# Patient Record
Sex: Male | Born: 1950
Health system: Southern US, Community
[De-identification: ages and names within clinical notes are randomized; demographics above are authoritative.]

## PROBLEM LIST (undated history)

## (undated) DIAGNOSIS — D509 Iron deficiency anemia, unspecified: Secondary | ICD-10-CM

## (undated) DIAGNOSIS — N281 Cyst of kidney, acquired: Secondary | ICD-10-CM

## (undated) DIAGNOSIS — G4730 Sleep apnea, unspecified: Secondary | ICD-10-CM

## (undated) DIAGNOSIS — G20C Parkinsonism, unspecified: Secondary | ICD-10-CM

## (undated) DIAGNOSIS — G301 Alzheimer's disease with late onset: Secondary | ICD-10-CM

## (undated) DIAGNOSIS — F02B3 Dementia in other diseases classified elsewhere, moderate, with mood disturbance: Secondary | ICD-10-CM

## (undated) DIAGNOSIS — I10 Essential (primary) hypertension: Secondary | ICD-10-CM

## (undated) DIAGNOSIS — H547 Unspecified visual loss: Secondary | ICD-10-CM

## (undated) DIAGNOSIS — R972 Elevated prostate specific antigen [PSA]: Secondary | ICD-10-CM

## (undated) DIAGNOSIS — K219 Gastro-esophageal reflux disease without esophagitis: Secondary | ICD-10-CM

## (undated) DIAGNOSIS — F32A Depression, unspecified: Secondary | ICD-10-CM

## (undated) DIAGNOSIS — U071 COVID-19: Secondary | ICD-10-CM

## (undated) DIAGNOSIS — F329 Major depressive disorder, single episode, unspecified: Secondary | ICD-10-CM

## (undated) DIAGNOSIS — G4733 Obstructive sleep apnea (adult) (pediatric): Secondary | ICD-10-CM

## (undated) DIAGNOSIS — K2270 Barrett's esophagus without dysplasia: Secondary | ICD-10-CM

## (undated) DIAGNOSIS — D126 Benign neoplasm of colon, unspecified: Secondary | ICD-10-CM

## (undated) DIAGNOSIS — I219 Acute myocardial infarction, unspecified: Secondary | ICD-10-CM

## (undated) DIAGNOSIS — N401 Enlarged prostate with lower urinary tract symptoms: Secondary | ICD-10-CM

## (undated) DIAGNOSIS — I509 Heart failure, unspecified: Secondary | ICD-10-CM

## (undated) DIAGNOSIS — E785 Hyperlipidemia, unspecified: Secondary | ICD-10-CM

## (undated) HISTORY — DX: Unspecified visual loss: H54.7

## (undated) HISTORY — DX: Depression, unspecified: F32.A

## (undated) HISTORY — PX: VASECTOMY: SHX75

## (undated) HISTORY — DX: Gastro-esophageal reflux disease without esophagitis: K21.9

## (undated) HISTORY — DX: Heart failure, unspecified: I50.9

## (undated) HISTORY — DX: Acute myocardial infarction, unspecified: I21.9

## (undated) HISTORY — DX: Sleep apnea, unspecified: G47.30

## (undated) HISTORY — DX: Major depressive disorder, single episode, unspecified: F32.9

## (undated) HISTORY — DX: COVID-19: U07.1

## (undated) HISTORY — DX: Essential (primary) hypertension: I10

---

## 1978-03-31 HISTORY — PX: HEMORROIDECTOMY: SUR656

## 2005-03-21 ENCOUNTER — Emergency Department: Payer: Self-pay | Admitting: Emergency Medicine

## 2005-03-21 ENCOUNTER — Other Ambulatory Visit: Payer: Self-pay

## 2005-04-17 ENCOUNTER — Ambulatory Visit: Payer: Self-pay | Admitting: Gastroenterology

## 2005-04-22 ENCOUNTER — Ambulatory Visit: Payer: Self-pay | Admitting: Gastroenterology

## 2006-06-05 ENCOUNTER — Ambulatory Visit: Payer: Self-pay | Admitting: General Surgery

## 2006-06-08 ENCOUNTER — Ambulatory Visit: Payer: Self-pay | Admitting: General Surgery

## 2007-06-10 IMAGING — CR DG SMALL BOWEL
2 series · 8 of 8 positions shown · non-contrast
Comparison: none

REASON FOR EXAM: Anemia
COMMENTS:

[Series 1: view not recorded · 0.17mm/px · 6 of 6 slices shown]
[im 1/6]
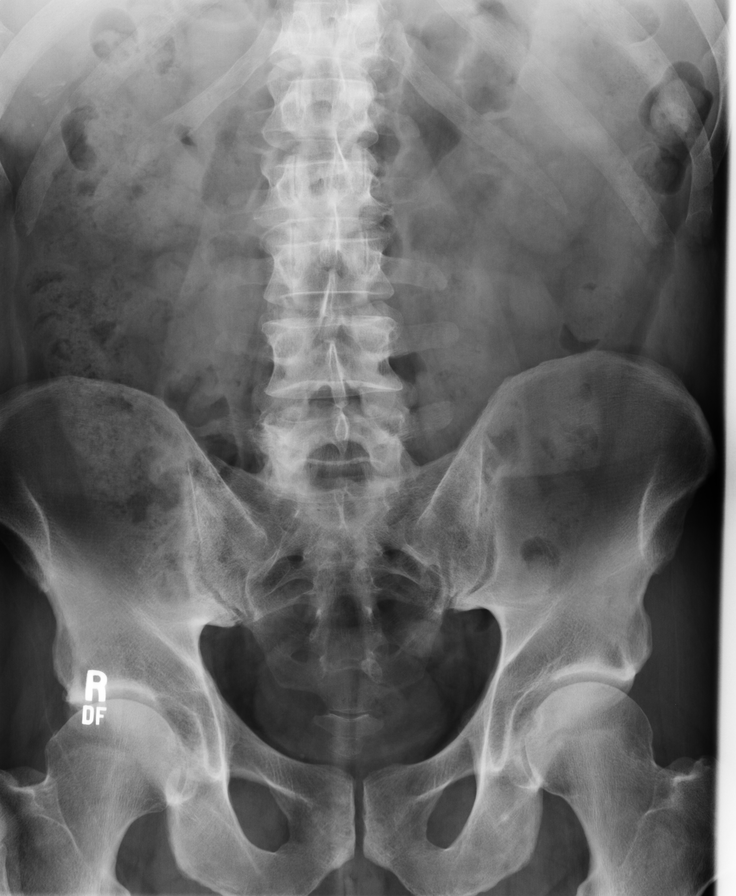
[im 2/6]
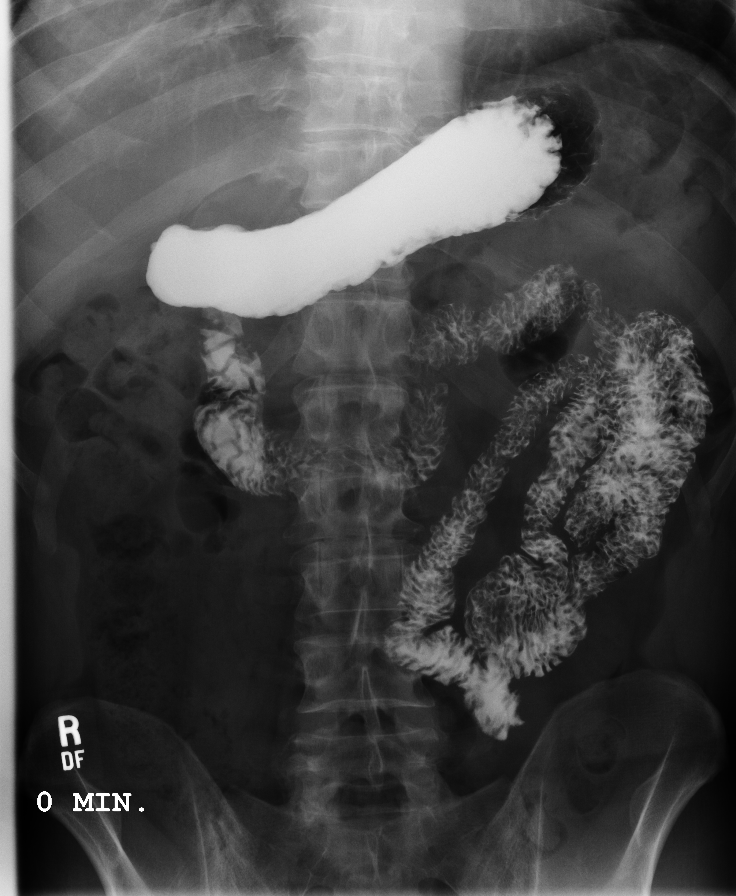
[im 3/6]
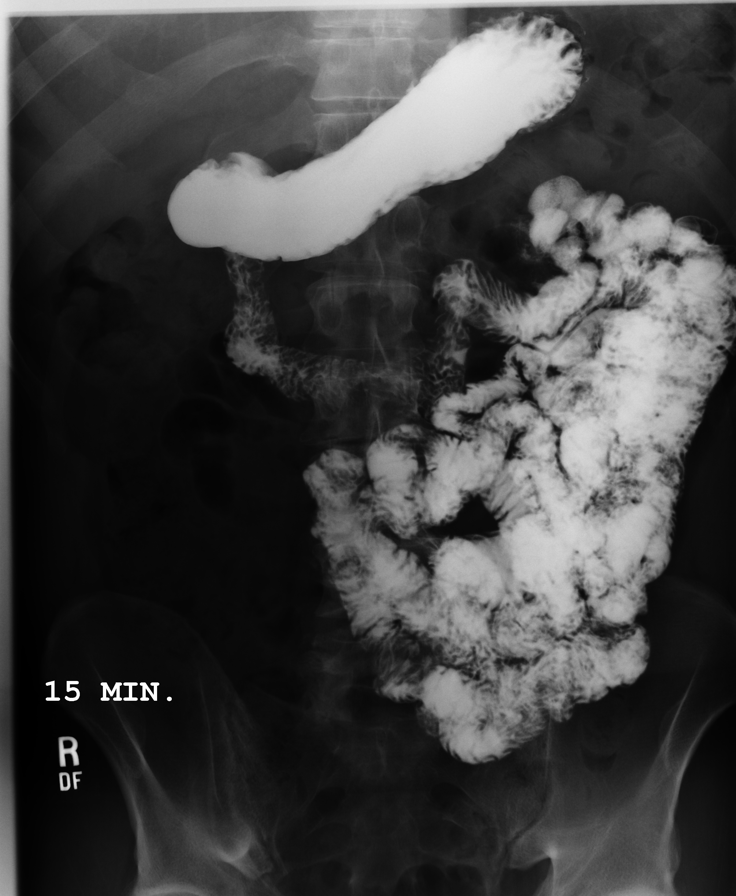
[im 4/6]
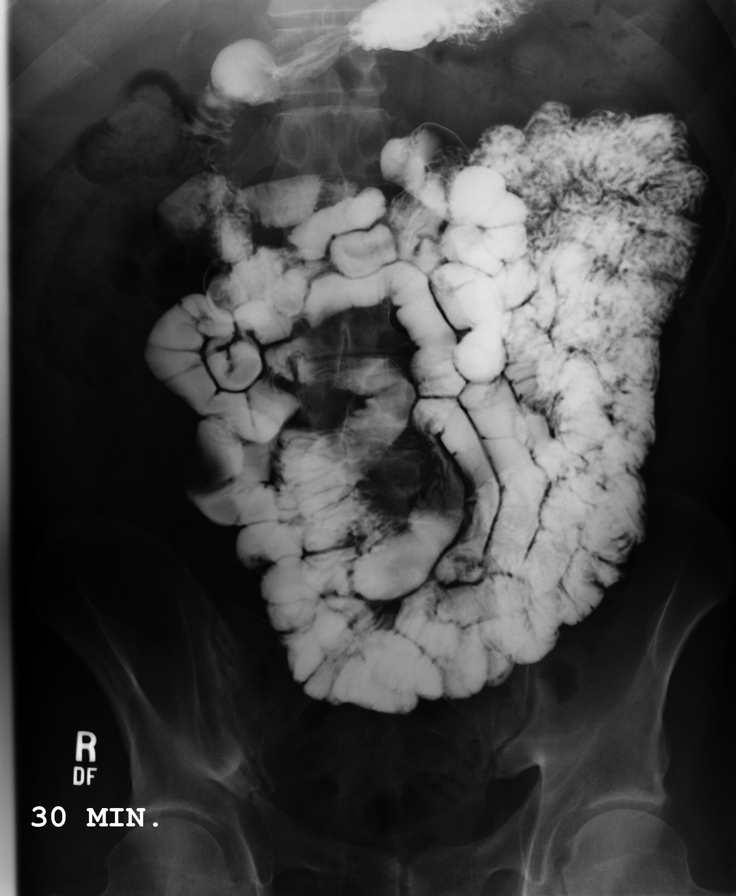
[im 5/6]
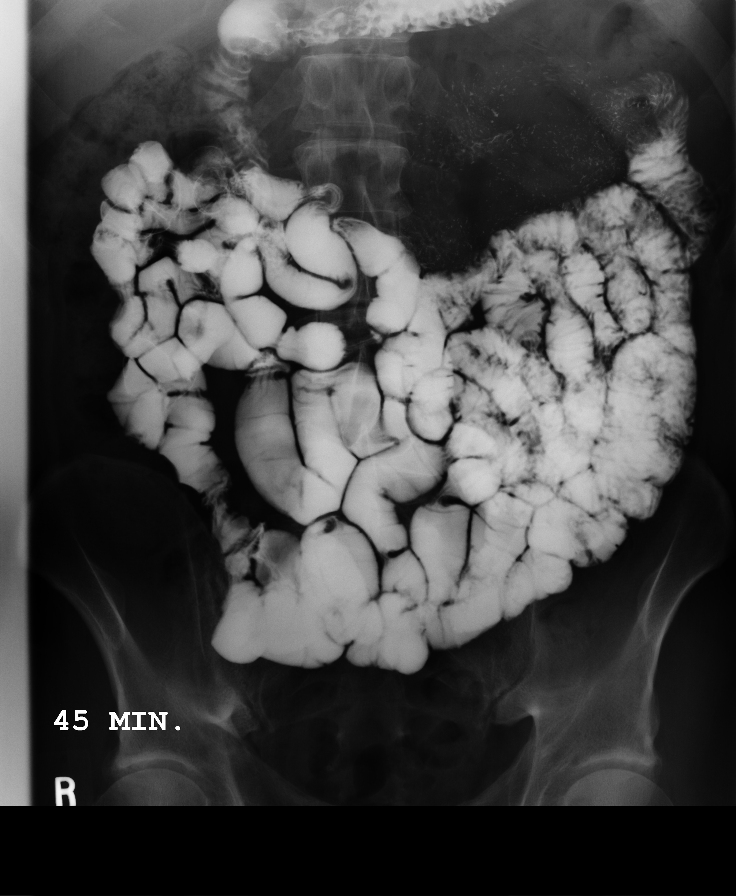
[im 6/6]
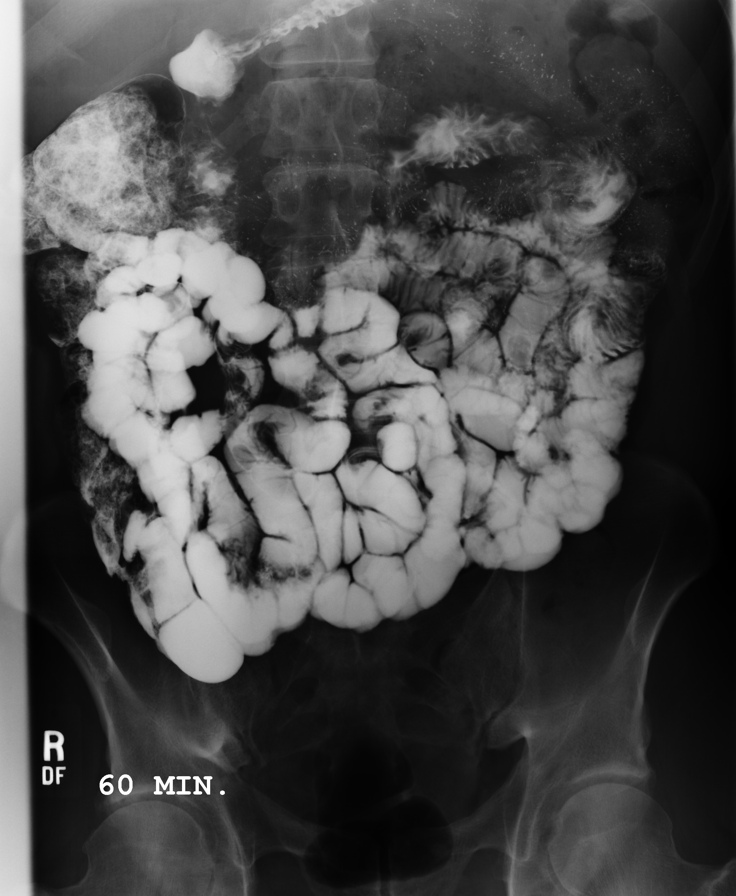

[Series 1: run · 2 of 2 slices shown]
[im 1/2]
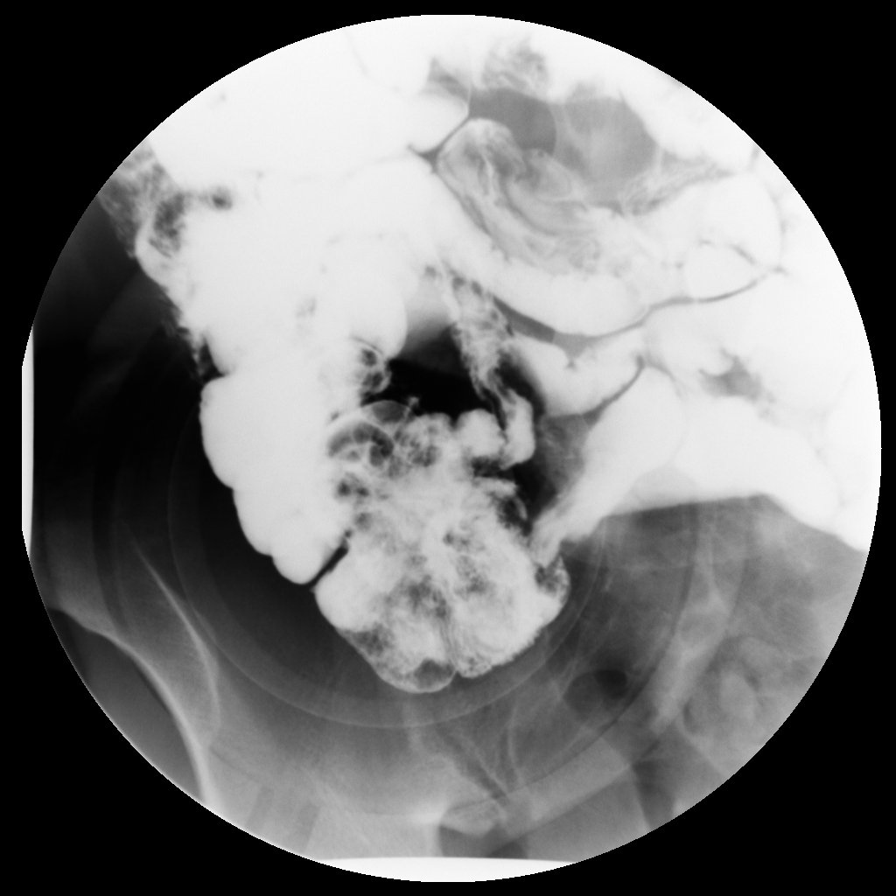
[im 2/2]
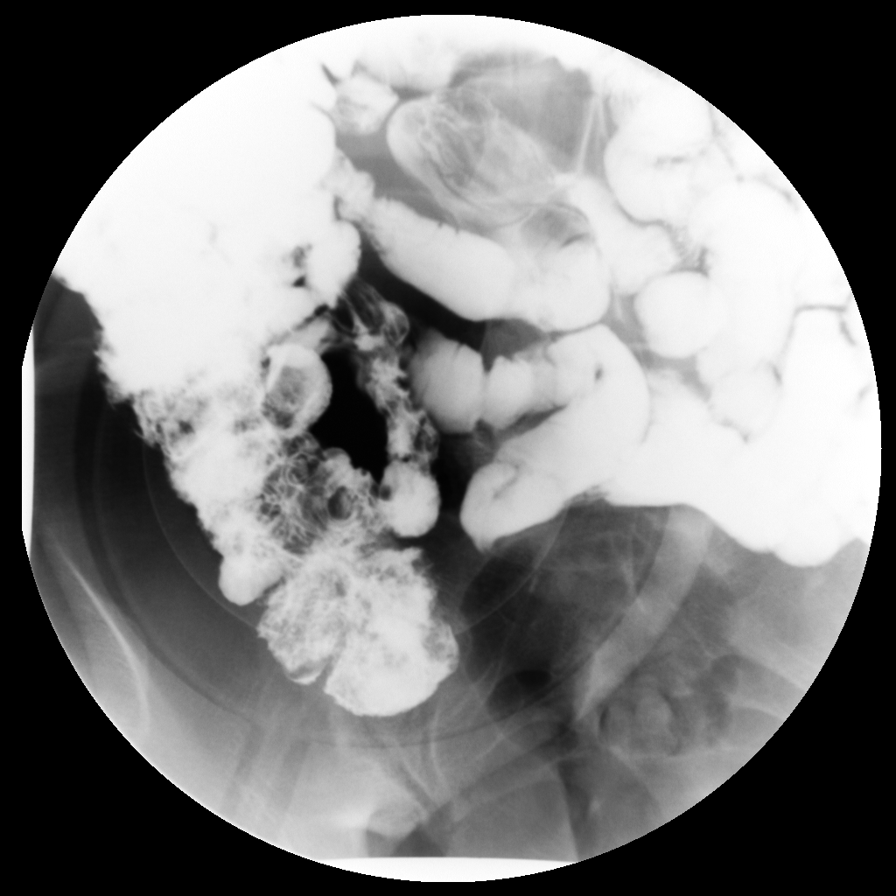

[8 of 8 positions shown; findings below may reference images not displayed]

PROCEDURE:     FL  - FL SMALL BOWEL  - April 22, 2005 [DATE]

RESULT:       The patient is being evaluated for anemia.

The anticipated procedure was discussed with Mr. Dawidh.  He voiced his
willingness to proceed. Scout film revealed considerable stool in the colon
but no evidence of obstruction.

Over the course of 60 minutes, the barium passed from duodenum to the RIGHT
colon.  The jejunal and ileal loops demonstrated normal mucosal pattern.
There was no tenderness to palpation and no evidence of stricture nor
abnormal dilation.  The RIGHT colon demonstrated the presence of
considerable stool.  The terminal ileum was demonstrated and peristalsis
through it was normal.
IMPRESSION: 1.     I do not see abnormality of the jejunum nor ileum.
2.     There is considerable stool present in the RIGHT colon which limits
evaluation of the cecum and specific region of the ileocecal valve.  The
terminal ileum itself distended well.

## 2009-11-29 ENCOUNTER — Ambulatory Visit: Payer: Self-pay | Admitting: General Practice

## 2010-01-14 ENCOUNTER — Ambulatory Visit: Payer: Self-pay | Admitting: Gastroenterology

## 2010-01-16 LAB — PATHOLOGY REPORT

## 2014-04-07 ENCOUNTER — Emergency Department: Payer: Self-pay | Admitting: Emergency Medicine

## 2015-01-05 ENCOUNTER — Ambulatory Visit: Payer: BLUE CROSS/BLUE SHIELD | Attending: Neurology

## 2015-01-05 DIAGNOSIS — G4761 Periodic limb movement disorder: Secondary | ICD-10-CM | POA: Insufficient documentation

## 2015-01-05 DIAGNOSIS — G4733 Obstructive sleep apnea (adult) (pediatric): Secondary | ICD-10-CM | POA: Diagnosis not present

## 2015-02-24 ENCOUNTER — Encounter: Payer: Self-pay | Admitting: Internal Medicine

## 2015-03-08 ENCOUNTER — Encounter: Payer: Self-pay | Admitting: Internal Medicine

## 2015-04-09 ENCOUNTER — Encounter: Payer: Self-pay | Admitting: Internal Medicine

## 2015-04-10 ENCOUNTER — Encounter: Payer: Self-pay | Admitting: Internal Medicine

## 2015-04-23 ENCOUNTER — Encounter: Payer: Self-pay | Admitting: Internal Medicine

## 2015-04-23 ENCOUNTER — Ambulatory Visit (INDEPENDENT_AMBULATORY_CARE_PROVIDER_SITE_OTHER): Payer: BLUE CROSS/BLUE SHIELD | Admitting: Internal Medicine

## 2015-04-23 VITALS — BP 122/68 | HR 59 | Ht 72.0 in | Wt 185.5 lb

## 2015-04-23 DIAGNOSIS — G4733 Obstructive sleep apnea (adult) (pediatric): Secondary | ICD-10-CM

## 2015-04-23 NOTE — Patient Instructions (Addendum)
--  Will start an Auto-PAP low=8; high=16 --If having trouble tolerating the pressure, please call and we can decrease the pressure.

## 2015-04-23 NOTE — Progress Notes (Signed)
[facility] Consultation      Assessment and Plan:  Obstructive sleep apnea.  - elevated AHI on CPAP titration at higher pressures. Suspect this may be secondary to inadequately treated obstructive sleep apnea or more likely a suspect that this is treatment  related apnea episodes , which are  arising and REM sleep and at higher pressures. This appears to sink with what we are seeing on his original sleep study which showed continued apneas during REM phase.   - he appears to have improved in terms of excessive daytime sleepiness symptoms even at the  lower pressure. Therefore, we will restart an auto titrating CPAP with a low pressure of 8 and a high of 16. Will adjust based on findings. He is asked to call us should he find it difficult to tolerate the higher pressures a week and back. This down a bit.  Essential Hypertension.   - sleep apnea can contribute to hypertension. Therefore, we'll need to monitor this. Currently his hypertension appears to be adequately controlled without medications.  Bursitis  -patient notes that his knee pain appears to interrupt his sleep , this appears to be adequately controlled with meloxicam , would continue.  Date: 04/23/2015  MRN# [id number] [name] [name] [birth year]  Referring Physician: Dr. Verl Bangs [name] [name] is a 65 y.o. old male seen in consultation for chief complaint of:    Chief Complaint  Patient presents with  . SLEEP CONSULT    pt. ref. by dr. Corinda [name]. pt. states he started CPAP 29mo. ago set pressure of 8 didn't feel that was strong enough. wears everynoght 4-6 hr. everynight.  supplies needed (pillow, hose & flitters).     HPI:    the patient is a 65 year old male with a history of obstructive sleep apnea. He is currently on CPAP, but is having trouble getting his sleep apnea adequately controlled. The patient typically goes to bed between 10 to 10:30 PM he falls asleep within about one hour. He wakes up or  possibly 3 times a night, and wakes up in the morning at 6 AM.  He goes to Sunday school, and he was noted to be dozing off, one of his friends has OSA and recommended that he look into it. He then spoke with his doctor and a sleep study was done.   He initially had a sleep study in October of 2016, pressure was set at 8. When he was at 62, he felt that he was doing well, he was feeling more rested and was tolerated the pressure. On his downloads he continued to have AHI that was elevated, thus he was put on a auto PAP and still have elevated AHI.  He notes that at the high pressures, he could no longer tolerate the PAP. Then it was turned back to 8 and he tolerates it much better, particularly now that he is on a nasal pillows. He notices now that he is back to 8, he is less sleepy during the day. He notes that the night of the sleep study it was the "best sleep of my life".    Review of previous sleep study raw data and tracings : 01/05/2015 ; apnea popping index was 44 , the PLM index was 62 , with an arousal index of 8 , the night was split the patient was started on CPAP and titrated up to a level of 8 within a child of 65 he did achieve REM sleep in supine position for  17 minutes , the arousal index and REM sleep was 7. Overall this test shows severe obstructive sleep apnea which could be treated at a CPAP of 8.   - subsequent auto titrating CPAP at a low pressure of 4. High pressure of 20 , review of the tracing shows that the patient 95th percentile was at 19 centimeters water , subsequently his pressure was  Increased to 19 , thendecreased back to 8 , presumably due to intolerance of the high pressure.  PMHX:   Past Medical History  Diagnosis Date  . Hypertension   . Sleep apnea    Surgical Hx:  History reviewed. No pertinent past surgical history. Family Hx:  Family History  Problem Relation Age of Onset  . Stroke Father   . Heart attack Father    Social Hx:   Social History    Substance Use Topics  . Smoking status: Not on file  . Smokeless tobacco: Not on file  . Alcohol Use: Not on file   Medication:   Current Outpatient Rx  Name  Route  Sig  Dispense  Refill  . meloxicam (MOBIC) 15 MG tablet   Oral   Take 15 mg by mouth daily.         Marland Kitchen omeprazole (PRILOSEC) 20 MG capsule            10       Allergies:  Review of patient's allergies indicates not on file.  Review of Systems: Gen:  Denies  fever, sweats, chills HEENT: Denies blurred vision, double vision. bleeds, sore throat Cvc:  No dizziness, chest pain. Resp:   Denies cough or sputum porduction, shortness of breath Gi: Denies swallowing difficulty, stomach pain. Gu:  Denies bladder incontinence, burning urine Ext:   No Joint pain, stiffness. Skin: No skin rash,  hives Endoc:  No polyuria, polydipsia. Psych: No depression, insomnia. Other:  All other systems were reviewed with the patient and were negative other that what is mentioned in the HPI.   Physical Examination:   VS: BP 122/68 mmHg  Pulse 59  Ht 6' (1.829 m)  Wt 185 lb 8 oz (84.142 kg)  BMI 25.15 kg/m2  SpO2 97%  General Appearance: No distress  Neuro:without focal findings,  speech normal,  HEENT: PERRLA, EOM intact.   Pulmonary: normal breath sounds, No wheezing.  CardiovascularNormal S1,S2.  No m/r/g.   Abdomen: Benign, Soft, non-tender. Renal:  No costovertebral tenderness  GU:  No performed at this time. Endoc: No evident thyromegaly, no signs of acromegaly. Skin:   warm, no rashes, no ecchymosis  Extremities: normal, no cyanosis, clubbing.  Other findings:    LABORATORY PANEL:   CBC No results for input(s): WBC, HGB, HCT, PLT in the last 168 hours. ------------------------------------------------------------------------------------------------------------------  Chemistries  No results for input(s): NA, K, CL, CO2, GLUCOSE, BUN, CREATININE, CALCIUM, MG, AST, ALT, ALKPHOS, BILITOT in the last 168  hours.  Invalid input(s): GFRCGP ------------------------------------------------------------------------------------------------------------------  Cardiac Enzymes No results for input(s): TROPONINI in the last 168 hours. ------------------------------------------------------------  RADIOLOGY:  No results found.     Thank  you for the consultation and for allowing [facility] to assist in the care of your patient. Our recommendations are noted above.  Please contact us if we can be of further service.   Marda Stalker, MD.  Board Certified in Internal Medicine, Pulmonary Medicine, [place], and Sleep Medicine.  [facility]   Patricia Pesa, M.D.  Vilinda Boehringer, M.D.  Merton Border, M.D

## 2015-05-29 ENCOUNTER — Encounter: Payer: Self-pay | Admitting: Internal Medicine

## 2015-07-20 ENCOUNTER — Ambulatory Visit (INDEPENDENT_AMBULATORY_CARE_PROVIDER_SITE_OTHER): Payer: BLUE CROSS/BLUE SHIELD | Admitting: Internal Medicine

## 2015-07-20 ENCOUNTER — Encounter: Payer: Self-pay | Admitting: Internal Medicine

## 2015-07-20 VITALS — BP 124/72 | HR 65 | Ht 72.0 in | Wt 181.0 lb

## 2015-07-20 DIAGNOSIS — G4733 Obstructive sleep apnea (adult) (pediatric): Secondary | ICD-10-CM | POA: Diagnosis not present

## 2015-07-20 NOTE — Progress Notes (Signed)
* [facility]     Assessment and Plan:  Obstructive sleep apnea.  - elevated AHI on CPAP titration at higher pressures.   - he appears to have improved in terms of excessive daytime sleepiness symptoms even at the  lower pressure. Today, we will adjust his auto pressure from 8-16 up to 12-16. We will likely leave it at that level for these foreseeable future as long as he tolerates this and his apnea levels are tolerable.  Essential Hypertension.    - sleep apnea can contribute to hypertension. Therefore, we'll need to monitor this. Currently his hypertension appears to be adequately controlled without medications.  Bursitis  -patient notes that his knee pain appears to interrupt his sleep , this appears to be adequately controlled with meloxicam , would continue.   Date: 07/20/2015  MRN# [id number] [name] [name] 10-31-1950   [name] [name] is a 65 y.o. old male seen in follow up for chief complaint of  Chief Complaint  Patient presents with  . Follow-up    wears CPAP 5hr everynight. feels pressure is good. no supplies needed. [id number]     HPI:   Patient was put on an auto titration study done from 04/30/2015. 2. Febrile 28th 2017 AutoSet pressure was between 8 and 16. The residual apnea-hypopnea index was 8.6.  He has been using the machine at the lower pressure, he notes that he is tolerating the pressure, despite the elevated AHI he is not sleeping as much as he used to, he previously slept 7 hours per night, but now he sleeps about 5 hours per night and he feels well with this. He is waking more refreshed and is not as sleepy during the day.    Review of previous sleep study raw data and tracings : 01/05/2015 ; apnea hypopnea index was 44 , the PLM index was 62 , with an arousal index of 8 , the night was split the patient was started on CPAP and titrated up to a level of 8 within a child of 1.6 he did achieve REM sleep in supine position for 17 minutes ,  the arousal index and REM sleep was 7. Overall this test shows severe obstructive sleep apnea which could be treated at a CPAP of 8.    - subsequent auto titrating CPAP at a low pressure of 4. High pressure of 20 , review of the tracing shows that the patient 95th percentile was at 19 centimeters water , subsequently his pressure was  Increased to 19 , thendecreased back to 8 , presumably due to intolerance of the high pressure.  Medication:   Outpatient Encounter Prescriptions as of 07/20/2015  Medication Sig  . aspirin-acetaminophen-caffeine (EXCEDRIN MIGRAINE) [id number] MG tablet Take by mouth every 6 (six) hours as needed for headache.  . b complex vitamins capsule Take 1 capsule by mouth daily.  . meloxicam (MOBIC) 15 MG tablet Take 15 mg by mouth daily.  Marland Kitchen omeprazole (PRILOSEC) 20 MG capsule   . UNABLE TO FIND Take 65 mg by mouth. IRON  . vitamin C (ASCORBIC ACID) 500 MG tablet Take 500 mg by mouth daily.   No facility-administered encounter medications on file as of 07/20/2015.     Allergies:  Review of patient's allergies indicates no known allergies.  Review of Systems: Gen:  Denies  fever, sweats. HEENT: Denies blurred vision. Cvc:  No dizziness, chest pain or heaviness Resp:   Denies cough or sputum porduction. Gi: Denies swallowing difficulty, stomach pain.  constipation, bowel incontinence Gu:  Denies bladder incontinence, burning urine Ext:   No Joint pain, stiffness. Skin: No skin rash, easy bruising. Endoc:  No polyuria, polydipsia. Psych: No depression, insomnia. Other:  All other systems were reviewed and found to be negative other than what is mentioned in the HPI.   Physical Examination:   VS: BP 124/72 mmHg  Pulse 65  Ht 6' (1.829 m)  Wt 181 lb (82.101 kg)  BMI 24.54 kg/m2  SpO2 98%  General Appearance: No distress  Neuro:without focal findings,  speech normal,  HEENT: PERRLA, EOM intact. Pulmonary: normal breath sounds, No wheezing.     CardiovascularNormal S1,S2.  No m/r/g.   Abdomen: Benign, Soft, non-tender. Renal:  No costovertebral tenderness  GU:  Not performed at this time. Endoc: No evident thyromegaly, no signs of acromegaly. Skin:   warm, no rash. Extremities: normal, no cyanosis, clubbing.   LABORATORY PANEL:   CBC No results for input(s): WBC, HGB, HCT, PLT in the last 168 hours. ------------------------------------------------------------------------------------------------------------------  Chemistries  No results for input(s): NA, K, CL, CO2, GLUCOSE, BUN, CREATININE, CALCIUM, MG, AST, ALT, ALKPHOS, BILITOT in the last 168 hours.  Invalid input(s): GFRCGP ------------------------------------------------------------------------------------------------------------------  Cardiac Enzymes No results for input(s): TROPONINI in the last 168 hours. ------------------------------------------------------------  RADIOLOGY:   No results found for this or any previous visit. No results found for this or any previous visit. ------------------------------------------------------------------------------------------------------------------  Thank  you for allowing [facility] to assist in the care of your patient. Our recommendations are noted above.  Please contact us if we can be of further service.   Marda Stalker, MD.  [facility] Office Number: [phone]  Patricia Pesa, M.D.  Vilinda Boehringer, M.D.  Merton Border, M.D  07/20/2015

## 2015-07-20 NOTE — Patient Instructions (Addendum)
We will change the auto-titration level to 12 to 16, and leave it at that level.    Sleep Apnea Sleep apnea is disorder that affects a person's sleep. A person with sleep apnea has abnormal pauses in their breathing when they sleep. It is hard for them to get a good sleep. This makes a person tired during the day. It also can lead to other physical problems. There are three types of sleep apnea. One type is when breathing stops for a short time because your airway is blocked (obstructive sleep apnea). Another type is when the brain sometimes fails to give the normal signal to breathe to the muscles that control your breathing (central sleep apnea). The third type is a combination of the other two types. HOME CARE   Take all medicine as told by your doctor.  Avoid alcohol, calming medicines (sedatives), and depressant drugs.  Try to lose weight if you are overweight. Talk to your doctor about a healthy weight goal.  Your doctor may have you use a device that helps to open your airway. It can help you get the air that you need. It is called a positive airway pressure (PAP) device.   MAKE SURE YOU:   Understand these instructions.  Will watch your condition.  Will get help right away if you are not doing well or get worse.  It may take approximately 1 month for you to get used to wearing her CPAP every night.

## 2015-07-20 NOTE — Addendum Note (Signed)
Addended by: Oscar La R on: 07/20/2015 09:08 AM   Modules accepted: Orders

## 2015-07-27 ENCOUNTER — Encounter: Admission: RE | Payer: Self-pay | Source: Ambulatory Visit

## 2015-07-27 ENCOUNTER — Ambulatory Visit
Admission: RE | Admit: 2015-07-27 | Payer: BLUE CROSS/BLUE SHIELD | Source: Ambulatory Visit | Admitting: Gastroenterology

## 2015-07-27 SURGERY — ESOPHAGOGASTRODUODENOSCOPY (EGD) WITH PROPOFOL
Anesthesia: General
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
## 2017-05-20 ENCOUNTER — Ambulatory Visit (INDEPENDENT_AMBULATORY_CARE_PROVIDER_SITE_OTHER): Payer: BLUE CROSS/BLUE SHIELD | Admitting: Cardiovascular Disease

## 2017-05-20 ENCOUNTER — Encounter: Payer: Self-pay | Admitting: Cardiovascular Disease

## 2017-05-20 VITALS — BP 140/87 | HR 58 | Ht 72.0 in | Wt 179.0 lb

## 2017-05-20 DIAGNOSIS — G4733 Obstructive sleep apnea (adult) (pediatric): Secondary | ICD-10-CM | POA: Diagnosis not present

## 2017-05-20 DIAGNOSIS — R079 Chest pain, unspecified: Secondary | ICD-10-CM | POA: Diagnosis not present

## 2017-05-20 DIAGNOSIS — I10 Essential (primary) hypertension: Secondary | ICD-10-CM | POA: Insufficient documentation

## 2017-05-20 DIAGNOSIS — R55 Syncope and collapse: Secondary | ICD-10-CM

## 2017-05-20 DIAGNOSIS — R9431 Abnormal electrocardiogram [ECG] [EKG]: Secondary | ICD-10-CM | POA: Diagnosis not present

## 2017-05-20 HISTORY — DX: Syncope and collapse: R55

## 2017-05-20 NOTE — Patient Instructions (Addendum)
Please call if you have chest pain with exertion   Medication Instructions:   No medication changes made  Labwork:  No new labs needed  Testing/Procedures:  No further testing at this time  Research CT coronary calcium score $150   Follow-Up: It was a pleasure seeing you in the office today. Please call us if you have new issues that need to be addressed before your next appt.  [phone]  Your physician wants you to follow-up in:  As needed  If you need a refill on your cardiac medications before your next appointment, please call your pharmacy.  For educational health videos Log in to : [email or web address] Or : [email or web address], password : [email or web address]
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
## 2017-06-16 LAB — PSA: PSA: 4.2

## 2017-06-23 ENCOUNTER — Other Ambulatory Visit: Payer: Self-pay

## 2017-06-24 ENCOUNTER — Ambulatory Visit: Payer: BLUE CROSS/BLUE SHIELD | Admitting: Cardiovascular Disease

## 2017-07-21 ENCOUNTER — Ambulatory Visit: Payer: Self-pay | Admitting: Urology

## 2017-09-29 ENCOUNTER — Ambulatory Visit: Payer: PPO | Admitting: Urology

## 2017-09-29 ENCOUNTER — Encounter: Payer: Self-pay | Admitting: Urology

## 2017-09-29 VITALS — BP 170/92 | HR 62 | Ht 72.0 in | Wt 179.0 lb

## 2017-09-29 DIAGNOSIS — R972 Elevated prostate specific antigen [PSA]: Secondary | ICD-10-CM | POA: Diagnosis not present

## 2017-09-29 DIAGNOSIS — N138 Other obstructive and reflux uropathy: Secondary | ICD-10-CM | POA: Diagnosis not present

## 2017-09-29 DIAGNOSIS — N401 Enlarged prostate with lower urinary tract symptoms: Secondary | ICD-10-CM | POA: Diagnosis not present

## 2017-09-29 LAB — URINALYSIS, COMPLETE
Bilirubin, UA: NEGATIVE
Glucose, UA: NEGATIVE
Ketones, UA: NEGATIVE
LEUKOCYTES UA: NEGATIVE
NITRITE UA: NEGATIVE
PH UA: 7 (ref 5.0–7.5)
Protein, UA: NEGATIVE
RBC UA: NEGATIVE
SPEC GRAV UA: 1.01 (ref 1.005–1.030)
Urobilinogen, Ur: 0.2 mg/dL (ref 0.2–1.0)

## 2017-09-29 NOTE — Progress Notes (Signed)
09/29/2017 5:20 PM   [name] [birth year] [id number]  Referring provider: Karen Kitchens, MD [address] [place], [place] [address]  Chief Complaint  Patient presents with  . Elevated PSA    New Patient    HPI: 67 yo M referred for further evaluation of elevated and rising PSA.  Most recently, his PSA has risen to 4.2    PSA trend as below.  He is followed by the city of [place].  He does have baseline urinary symptoms including urinary frequency, urgency which were fairly new onset around the time his PSA was drawn early this spring.  He was started on Flomax and his urinary symptoms resolved.  He is currently pleased with his voiding symptoms.    He was on finaseride 2017 x 6 months but ended up stopping this medication as it did not help his urinary symptoms.  He is very   vague recollection of this medication.  He believes he father may have had prostate cancer in his 33 s but died of other causes.    No UTIs or gross hematuria.   PSA 2.5. (2.22/2018)--> 3.9 (05/12/2017) --> 4.2 (06/16/17).    PMH: Past Medical History:  Diagnosis Date  . Depression   . GERD (gastroesophageal reflux disease)   . Heart attack ([place])   . Heart failure ([place])   . Hypertension   . Sleep apnea     Surgical History: Past Surgical History:  Procedure Laterality Date  . HEMORROIDECTOMY  1980    Home Medications:  Allergies as of 09/29/2017   No Known Allergies     Medication List        Accurate as of 09/29/17 11:59 PM. Always use your most recent med list.          CIALIS 10 MG tablet Generic drug:  tadalafil   omeprazole 20 MG capsule Commonly known as:  PRILOSEC Take 20 mg by mouth daily.   tamsulosin 0.4 MG Caps capsule Commonly known as:  FLOMAX       Allergies: No Known Allergies  Family History: Family History  Problem Relation Age of Onset  . Stroke Father   . Heart attack Father   . Prostate cancer Father     Social History:  reports that he  has never smoked. He has never used smokeless tobacco. He reports that he drinks alcohol. He reports that he does not use drugs.  ROS: UROLOGY Frequent Urination?: No Hard to postpone urination?: No Burning/pain with urination?: No Get up at night to urinate?: No Leakage of urine?: No Urine stream starts and stops?: No Trouble starting stream?: No Do you have to strain to urinate?: No Blood in urine?: No Urinary tract infection?: No Sexually transmitted disease?: No Injury to kidneys or bladder?: No Painful intercourse?: No Weak stream?: No Erection problems?: No Penile pain?: No  Gastrointestinal Nausea?: No Vomiting?: No Indigestion/heartburn?: No Diarrhea?: No Constipation?: No  Constitutional Fever: No Night sweats?: No Weight loss?: No Fatigue?: No  Skin Skin rash/lesions?: No Itching?: No  Eyes Blurred vision?: No Double vision?: No  Ears/Nose/Throat Sore throat?: No Sinus problems?: No  Hematologic/Lymphatic Swollen glands?: No Easy bruising?: No  Cardiovascular Leg swelling?: No Chest pain?: No  Respiratory Cough?: No Shortness of breath?: No  Endocrine Excessive thirst?: No  Musculoskeletal Back pain?: No Joint pain?: No  Neurological Headaches?: No Dizziness?: No  Psychologic Depression?: No Anxiety?: No  Physical Exam: BP (!) 170/92   Pulse 62   Ht  6' (1.829 m)   Wt 179 lb (81.2 kg)   BMI 24.28 kg/m   Constitutional:  Alert and oriented, No acute distress. HEENT: [place] AT, moist mucus membranes.  Trachea midline, no masses. Cardiovascular: No clubbing, cyanosis, or edema. Respiratory: Normal respiratory effort, no increased work of breathing. GI: Abdomen is soft, nontender, nondistended, no abdominal masses GU: No CVA tenderness Rectal: Normal sphincter tone.  Normal prostate, mildly enlarged without nodules or tenderness. Skin: No rashes, bruises or suspicious lesions. Neurologic: Grossly intact, no focal deficits,  moving all 4 extremities. Psychiatric: Normal mood and affect.  Laboratory Data: PSA as above  Urinalysis Results for orders placed or performed in visit on 09/29/17  Urinalysis, Complete  Result Value Ref Range   Specific Gravity, UA 1.010 1.005 - 1.030   pH, UA 7.0 5.0 - 7.5   Color, UA Yellow Yellow   Appearance Ur Clear Clear   Leukocytes, UA Negative Negative   Protein, UA Negative Negative/Trace   Glucose, UA Negative Negative   Ketones, UA Negative Negative   RBC, UA Negative Negative   Bilirubin, UA Negative Negative   Urobilinogen, Ur 0.2 0.2 - 1.0 mg/dL   Nitrite, UA Negative Negative  PSA  Result Value Ref Range   Prostate Specific Ag, Serum 3.8 0.0 - 4.0 ng/mL     Pertinent Imaging: n/a  Assessment & Plan:    1. Elevated PSA  We reviewed the implications of an elevated PSA and the uncertainty surrounding it. In general, a man's PSA increases with age and is produced by both normal and cancerous prostate tissue. Differential for elevated PSA is BPH, prostate cancer, infection, recent intercourse/ejaculation, prostate infarction, recent urethroscopic manipulation (foley placement/cystoscopy) and prostatitis. Management of an elevated PSA can include observation or prostate biopsy and wediscussed this in detail. We discussed that indications for prostate biopsy are defined by age and race specific PSA cutoffs as well as a PSA velocity of 0.75/year.  PSA was repeated today and is pending.  We will call him with the result.  Follow-up plan to be based on repeat PSA.  We discussed options including continued very close surveillance of his PSA versus proceeding with prostate biopsy.  We did go ahead and discussed the risk of prostate biopsy at length today and prepare him for this in case his PSA came back more elevated than the aforementioned values.  He is agreeable to proceed if needed. - Urinalysis, Complete - PSA  2. BPH with obstruction/lower urinary tract  symptoms Voiding symptoms improved on Flomax Given the fairly quick onset of the symptoms, question whether or not this was related to possible prostatitis   Follow-up TBD based on PSA  Hollice Espy, MD  [facility] [address] [place], [place] [address] [phone]

## 2017-09-30 LAB — PSA: PROSTATE SPECIFIC AG, SERUM: 3.8 ng/mL (ref 0.0–4.0)

## 2017-10-02 ENCOUNTER — Telehealth: Payer: Self-pay

## 2017-10-02 NOTE — Telephone Encounter (Signed)
-----   Message from Hollice Espy, MD sent at 09/30/2017  4:04 PM EDT ----- PSA is back down to 3.8.  Lets see you again in 9 months with PSA to reassess.  This is good news.    Hollice Espy, MD

## 2017-10-02 NOTE — Telephone Encounter (Signed)
Pt informed, please make sure an appointment has been made for 9 months with psa prior

## 2017-12-21 ENCOUNTER — Encounter: Payer: Self-pay | Admitting: Family Medicine

## 2017-12-21 ENCOUNTER — Ambulatory Visit (INDEPENDENT_AMBULATORY_CARE_PROVIDER_SITE_OTHER): Payer: PPO | Admitting: Family Medicine

## 2017-12-21 VITALS — BP 150/92 | HR 59 | Temp 98.3°F | Ht 69.0 in | Wt 180.4 lb

## 2017-12-21 DIAGNOSIS — E785 Hyperlipidemia, unspecified: Secondary | ICD-10-CM | POA: Insufficient documentation

## 2017-12-21 DIAGNOSIS — R972 Elevated prostate specific antigen [PSA]: Secondary | ICD-10-CM | POA: Insufficient documentation

## 2017-12-21 DIAGNOSIS — K22719 Barrett's esophagus with dysplasia, unspecified: Secondary | ICD-10-CM | POA: Insufficient documentation

## 2017-12-21 DIAGNOSIS — K2270 Barrett's esophagus without dysplasia: Secondary | ICD-10-CM | POA: Insufficient documentation

## 2017-12-21 DIAGNOSIS — G4733 Obstructive sleep apnea (adult) (pediatric): Secondary | ICD-10-CM

## 2017-12-21 DIAGNOSIS — Z23 Encounter for immunization: Secondary | ICD-10-CM | POA: Diagnosis not present

## 2017-12-21 DIAGNOSIS — Z5181 Encounter for therapeutic drug level monitoring: Secondary | ICD-10-CM | POA: Diagnosis not present

## 2017-12-21 DIAGNOSIS — I10 Essential (primary) hypertension: Secondary | ICD-10-CM | POA: Diagnosis not present

## 2017-12-21 DIAGNOSIS — E7800 Pure hypercholesterolemia, unspecified: Secondary | ICD-10-CM | POA: Diagnosis not present

## 2017-12-21 MED ORDER — AMLODIPINE BESYLATE 5 MG PO TABS: 5.0000 mg | ORAL_TABLET | Freq: Every day | ORAL | 3 refills | Status: DC

## 2017-12-21 NOTE — Assessment & Plan Note (Signed)
Managed by pulmonologist 

## 2017-12-21 NOTE — Assessment & Plan Note (Signed)
Managed by urologist 

## 2017-12-21 NOTE — Assessment & Plan Note (Addendum)
Patient believes this just happens at the doctor; there is white coat hypertension, but it's important to monitor and treat if not controlled; try DASH guidelines; avoid NSAIDs, avoid decongestants; he did agree to start CCB today; explained possible side effects (constipation, ankle edema), call if bothersome; close f/u in 2 weeks

## 2017-12-21 NOTE — Progress Notes (Signed)
BP (!) 150/92   Pulse (!) 59   Temp 98.3 F (36.8 C) (Oral)   Ht 5\' 9"  (1.753 m)   Wt 180 lb 6.4 oz (81.8 kg)   SpO2 96%   BMI 26.64 kg/m    Subjective:    Patient ID: [name] [name], male    DOB: 03/02/1951, 67 y.o.   MRN: [id number]  HPI: [name] [name] is a 67 y.o. male  Chief Complaint  Patient presents with  . New Patient (Initial Visit)    HPI He is here to establish care; he brought in his medicine list from previous doctor  Recent high PSA; his father may have had prostate cancer but passed from something else; urine stream is good, but getting up at night frequently; changed time of the flomax and that helped; time release; was getting up 3 am, so taking in the afternoon and now improved frequency at night  High blood pressure; he has white coat hypertension ; every time he comes to the doctor, BP goes up at the doctor but normal with the nurse; last time it was high, the doctor said to rest his arm and that improved it; father smoked like crazy, he took HTN medicines; he does not use NSAIDs or decongestants regularly; just if sick; no chest pain or headaches  Months ago, he went to the bathroom on the plane from [place] to [place], passed out; tried to get back up and passed out again; did EKG at [facility]; medics checked him out; he went to see his doctor for the city of [place], then saw cardiologist; they diagnosed him with dehydration; he was throwing up, sick prior to getting on the plane; no syncopal episodes since then; EKG at cardiologist office, Dr. Clovis Riley; they cleared him completely, no f/u planned  Barrett's esophagus; managed by GI [facility] at [facility]; had EGD in 2011 and note from 2017 mentioned getting EGD, but patient has not had that done yet; not taking PPI regularly, last was within the last week; trying to avoid triggers; no blood int eh stool or weight loss  OSA: managed by pulmonologist; he went through all the testing; did not tolerate CPAP; they  have tried other options; talked to cardiologist about it too  High cholesterol; he has not taken any cholesterol medicine for a few years; changed his diet and then it improved  Reviewed previous labs; elevated iron, 190 something; he says he was told about it but doesn't know why it was high  Depression screen [facility] 2/9 12/21/2017 12/21/2017  Decreased Interest 0 0  Down, Depressed, Hopeless 0 0  PHQ - 2 Score 0 0  Altered sleeping 3 -  Tired, decreased energy 0 -  Change in appetite 0 -  Feeling bad or failure about yourself  0 -  Trouble concentrating 0 -  Moving slowly or fidgety/restless 0 -  Suicidal thoughts 0 -  PHQ-9 Score 3 -  Difficult doing work/chores Not difficult at all -   Fall Risk  12/21/2017  Falls in the past year? No    Relevant past medical, surgical, family and social history reviewed Past Medical History:  Diagnosis Date  . Depression   . GERD (gastroesophageal reflux disease)   . Heart attack ([place])   . Heart failure ([place])   . Hypertension   . Sleep apnea    Past Surgical History:  Procedure Laterality Date  . HEMORROIDECTOMY  1980   Family History  Problem Relation Age of Onset  .  Stroke Father   . Heart attack Father   . Prostate cancer Father   . Heart disease Father   . Dementia Mother   . Heart attack Paternal Grandmother    Social History   Tobacco Use  . Smoking status: Never Smoker  . Smokeless tobacco: Never Used  Substance Use Topics  . Alcohol use: Yes    Alcohol/week: 0.0 standard drinks  . Drug use: No    Interim medical history since last visit reviewed. Allergies and medications reviewed  Review of Systems Per HPI unless specifically indicated above     Objective:    BP (!) 150/92   Pulse (!) 59   Temp 98.3 F (36.8 C) (Oral)   Ht 5\' 9"  (1.753 m)   Wt 180 lb 6.4 oz (81.8 kg)   SpO2 96%   BMI 26.64 kg/m   Wt Readings from Last 3 Encounters:  12/21/17 180 lb 6.4 oz (81.8 kg)  09/29/17 179 lb (81.2 kg)    05/20/17 179 lb (81.2 kg)    Physical Exam  Constitutional: He appears well-developed and well-nourished. No distress.  HENT:  Head: Normocephalic and atraumatic.  Eyes: EOM are normal. No scleral icterus.  Neck: No thyromegaly present.  Cardiovascular: Regular rhythm. Bradycardia present.  Borderline bradycardic rate  Pulmonary/Chest: Effort normal and breath sounds normal.  Abdominal: Soft. Bowel sounds are normal. He exhibits no distension. There is no tenderness.  Musculoskeletal: He exhibits no edema.  Neurological: Coordination normal.  Skin: Skin is warm and dry. No pallor.  Psychiatric: He has a normal mood and affect. His mood appears not anxious. He does not exhibit a depressed mood.    Results for orders placed or performed in visit on 09/29/17  Urinalysis, Complete  Result Value Ref Range   Specific Gravity, UA 1.010 1.005 - 1.030   pH, UA 7.0 5.0 - 7.5   Color, UA Yellow Yellow   Appearance Ur Clear Clear   Leukocytes, UA Negative Negative   Protein, UA Negative Negative/Trace   Glucose, UA Negative Negative   Ketones, UA Negative Negative   RBC, UA Negative Negative   Bilirubin, UA Negative Negative   Urobilinogen, Ur 0.2 0.2 - 1.0 mg/dL   Nitrite, UA Negative Negative  PSA  Result Value Ref Range   Prostate Specific Ag, Serum 3.8 0.0 - 4.0 ng/mL      Assessment & Plan:   Problem List Items Addressed This Visit      Cardiovascular and Mediastinum   Essential hypertension - Primary (Chronic)    Patient believes this just happens at the doctor; there is white coat hypertension, but it's important to monitor and treat if not controlled; try DASH guidelines; avoid NSAIDs, avoid decongestants; he did agree to start CCB today; explained possible side effects (constipation, ankle edema), call if bothersome; close f/u in 2 weeks      Relevant Medications   amLODipine (NORVASC) 5 MG tablet     Respiratory   Obstructive sleep apnea (Chronic)    Managed by  pulmonologist      Relevant Orders   CBC with Differential/Platelet     Digestive   Barrett's esophagus with dysplasia (Chronic)   Relevant Orders   Ambulatory referral to Gastroenterology     Other   Medication monitoring encounter    Check labs      Relevant Orders   COMPLETE METABOLIC PANEL WITH GFR   High cholesterol   Relevant Medications   amLODipine (NORVASC) 5 MG tablet  Other Relevant Orders   Lipid panel   Elevated PSA    Managed by urologist       Other Visit Diagnoses    Need for influenza vaccination       Relevant Orders   Flu Vaccine QUAD 6+ mos PF IM (Fluarix Quad PF) (Completed)   Need for vaccination against Streptococcus pneumoniae using pneumococcal conjugate vaccine 13       Relevant Orders   Pneumococcal conjugate vaccine 13-valent IM (Completed)   Iron overload       Relevant Orders   Iron, TIBC and Ferritin Panel   CBC with Differential/Platelet       Follow up plan: Return in about 2 weeks (around 01/04/2018) for follow-up visit with Dr. Sanda Klein.  An after-visit summary was printed and given to the patient at [facility].  Please see the patient instructions which may contain other information and recommendations beyond what is mentioned above in the assessment and plan.  Meds ordered this encounter  Medications  . amLODipine (NORVASC) 5 MG tablet    Sig: Take 1 tablet (5 mg total) by mouth daily.    Dispense:  90 tablet    Refill:  3    Orders Placed This Encounter  Procedures  . Flu Vaccine QUAD 6+ mos PF IM (Fluarix Quad PF)  . Pneumococcal conjugate vaccine 13-valent IM  . COMPLETE METABOLIC PANEL WITH GFR  . Iron, TIBC and Ferritin Panel  . CBC with Differential/Platelet  . Lipid panel  . Ambulatory referral to Gastroenterology

## 2017-12-21 NOTE — Assessment & Plan Note (Signed)
Check labs 

## 2017-12-21 NOTE — Patient Instructions (Addendum)
Try to follow the DASH guidelines (DASH stands for Dietary Approaches to Stop Hypertension). Try to limit the sodium in your diet to no more than 1,500mg  of sodium per day. Certainly try to not exceed 2,000 mg per day at the very most. Do not add salt when cooking or at the table.  Check the sodium amount on labels when shopping, and choose items lower in sodium when given a choice. Avoid or limit foods that already contain a lot of sodium. Eat a diet rich in fruits and vegetables and whole grains, and try to lose weight if overweight or obese  Try to use PLAIN allergy medicine without the decongestant Avoid: phenylephrine, phenylpropanolamine, and pseudoephredine  If you need something for aches or pains, try to use Tylenol (acetaminophen) instead of non-steroidals (which include Aleve, ibuprofen, Advil, Motrin, and naproxen); non-steroidals can cause long-term kidney damage  Please do pick up a blood pressure cuff and monitor your blood pressure at home If the numbers are running over 140/90, please contact me Untreated hypertension increases the risk of heart attack and stroke, as well as other problems   DASH Eating Plan DASH stands for "Dietary Approaches to Stop Hypertension." The DASH eating plan is a healthy eating plan that has been shown to reduce high blood pressure (hypertension). It may also reduce your risk for type 2 diabetes, heart disease, and stroke. The DASH eating plan may also help with weight loss. What are tips for following this plan? General guidelines  Avoid eating more than 2,300 mg (milligrams) of salt (sodium) a day. If you have hypertension, you may need to reduce your sodium intake to 1,500 mg a day.  Limit alcohol intake to no more than 1 drink a day for nonpregnant women and 2 drinks a day for men. One drink equals 12 oz of beer, 5 oz of wine, or 1 oz of hard liquor.  Work with your health care provider to maintain a healthy body weight or to lose weight. Ask  what an ideal weight is for you.  Get at least 30 minutes of exercise that causes your heart to beat faster (aerobic exercise) most days of the week. Activities may include walking, swimming, or biking.  Work with your health care provider or diet and nutrition specialist (dietitian) to adjust your eating plan to your individual calorie needs. Reading food labels  Check food labels for the amount of sodium per serving. Choose foods with less than 5 percent of the Daily Value of sodium. Generally, foods with less than 300 mg of sodium per serving fit into this eating plan.  To find whole grains, look for the word "whole" as the first word in the ingredient list. Shopping  Buy products labeled as "low-sodium" or "no salt added."  Buy fresh foods. Avoid canned foods and premade or frozen meals. Cooking  Avoid adding salt when cooking. Use salt-free seasonings or herbs instead of table salt or sea salt. Check with your health care provider or pharmacist before using salt substitutes.  Do not fry foods. Cook foods using healthy methods such as baking, boiling, grilling, and broiling instead.  Cook with heart-healthy oils, such as olive, canola, soybean, or sunflower oil. Meal planning   Eat a balanced diet that includes: ? 5 or more servings of fruits and vegetables each day. At each meal, try to fill half of your plate with fruits and vegetables. ? Up to 6-8 servings of whole grains each day. ? Less than 6 oz of  lean meat, poultry, or fish each day. A 3-oz serving of meat is about the same size as a deck of cards. One egg equals 1 oz. ? 2 servings of low-fat dairy each day. ? A serving of nuts, seeds, or beans 5 times each week. ? Heart-healthy fats. Healthy fats called Omega-3 fatty acids are found in foods such as flaxseeds and coldwater fish, like sardines, salmon, and mackerel.  Limit how much you eat of the following: ? Canned or prepackaged foods. ? Food that is high in trans  fat, such as fried foods. ? Food that is high in saturated fat, such as fatty meat. ? Sweets, desserts, sugary drinks, and other foods with added sugar. ? Full-fat dairy products.  Do not salt foods before eating.  Try to eat at least 2 vegetarian meals each week.  Eat more home-cooked food and less restaurant, buffet, and fast food.  When eating at a restaurant, ask that your food be prepared with less salt or no salt, if possible. What foods are recommended? The items listed may not be a complete list. Talk with your dietitian about what dietary choices are best for you. Grains Whole-grain or whole-wheat bread. Whole-grain or whole-wheat pasta. Brown rice. [name]. Bulgur. Whole-grain and low-sodium cereals. Pita bread. Low-fat, low-sodium crackers. Whole-wheat flour tortillas. Vegetables Fresh or frozen vegetables (raw, steamed, roasted, or grilled). Low-sodium or reduced-sodium tomato and vegetable juice. Low-sodium or reduced-sodium tomato sauce and tomato paste. Low-sodium or reduced-sodium canned vegetables. Fruits All fresh, dried, or frozen fruit. Canned fruit in natural juice (without added sugar). Meat and other protein foods Skinless chicken or [place]. Ground chicken or [place]. Pork with fat trimmed off. Fish and seafood. Egg whites. Dried beans, peas, or lentils. Unsalted nuts, nut butters, and seeds. Unsalted canned beans. Lean cuts of beef with fat trimmed off. Low-sodium, lean deli meat. Dairy Low-fat (1%) or fat-free (skim) milk. Fat-free, low-fat, or reduced-fat cheeses. Nonfat, low-sodium ricotta or cottage cheese. Low-fat or nonfat yogurt. Low-fat, low-sodium cheese. Fats and oils Soft margarine without trans fats. Vegetable oil. Low-fat, reduced-fat, or light mayonnaise and salad dressings (reduced-sodium). Canola, safflower, olive, soybean, and sunflower oils. Avocado. Seasoning and other foods Herbs. Spices. Seasoning mixes without salt. Unsalted popcorn and  pretzels. Fat-free sweets. What foods are not recommended? The items listed may not be a complete list. Talk with your dietitian about what dietary choices are best for you. Grains Baked goods made with fat, such as croissants, muffins, or some breads. Dry pasta or rice meal packs. Vegetables Creamed or fried vegetables. Vegetables in a cheese sauce. Regular canned vegetables (not low-sodium or reduced-sodium). Regular canned tomato sauce and paste (not low-sodium or reduced-sodium). Regular tomato and vegetable juice (not low-sodium or reduced-sodium). Angie Fava. Olives. Fruits Canned fruit in a light or heavy syrup. Fried fruit. Fruit in cream or butter sauce. Meat and other protein foods Fatty cuts of meat. Ribs. Fried meat. Berniece Salines. Sausage. Bologna and other processed lunch meats. Salami. Fatback. Hotdogs. Bratwurst. Salted nuts and seeds. Canned beans with added salt. Canned or smoked fish. Whole eggs or egg yolks. Chicken or [place] with skin. Dairy Whole or 2% milk, cream, and half-and-half. Whole or full-fat cream cheese. Whole-fat or sweetened yogurt. Full-fat cheese. Nondairy creamers. Whipped toppings. Processed cheese and cheese spreads. Fats and oils Butter. Stick margarine. Lard. Shortening. Ghee. Bacon fat. Tropical oils, such as coconut, palm kernel, or palm oil. Seasoning and other foods Salted popcorn and pretzels. Onion salt, garlic salt, seasoned salt, table  salt, and sea salt. Worcestershire sauce. Tartar sauce. Barbecue sauce. Teriyaki sauce. Soy sauce, including reduced-sodium. Steak sauce. Canned and packaged gravies. Fish sauce. Oyster sauce. Cocktail sauce. Horseradish that you find on the shelf. Ketchup. Mustard. Meat flavorings and tenderizers. Bouillon cubes. Hot sauce and Tabasco sauce. Premade or packaged marinades. Premade or packaged taco seasonings. Relishes. Regular salad dressings. Where to find more information:  National Heart, Lung, and [place]:  https://wilson-eaton.com/  American Heart Association: www.heart.org Summary  The DASH eating plan is a healthy eating plan that has been shown to reduce high blood pressure (hypertension). It may also reduce your risk for type 2 diabetes, heart disease, and stroke.  With the DASH eating plan, you should limit salt (sodium) intake to 2,300 mg a day. If you have hypertension, you may need to reduce your sodium intake to 1,500 mg a day.  When on the DASH eating plan, aim to eat more fresh fruits and vegetables, whole grains, lean proteins, low-fat dairy, and heart-healthy fats.  Work with your health care provider or diet and nutrition specialist (dietitian) to adjust your eating plan to your individual calorie needs. This information is not intended to replace advice given to you by your health care provider. Make sure you discuss any questions you have with your health care provider. Document Released: 03/06/2011 Document Revised: 03/10/2016 Document Reviewed: 03/10/2016 Elsevier Interactive Patient Education  [employer].

## 2017-12-22 ENCOUNTER — Other Ambulatory Visit: Payer: Self-pay | Admitting: Family Medicine

## 2017-12-22 LAB — CBC WITH DIFFERENTIAL/PLATELET
Basophils Absolute: 32 cells/uL (ref 0–200)
Basophils Relative: 0.6 %
EOS ABS: 38 {cells}/uL (ref 15–500)
Eosinophils Relative: 0.7 %
HEMATOCRIT: 46 % (ref 38.5–50.0)
Hemoglobin: 16.4 g/dL (ref 13.2–17.1)
LYMPHS ABS: 1242 {cells}/uL (ref 850–3900)
MCH: 32.5 pg (ref 27.0–33.0)
MCHC: 35.7 g/dL (ref 32.0–36.0)
MCV: 91.3 fL (ref 80.0–100.0)
MPV: 10 fL (ref 7.5–12.5)
Monocytes Relative: 7 %
Neutro Abs: 3710 cells/uL (ref 1500–7800)
Neutrophils Relative %: 68.7 %
Platelets: 241 10*3/uL (ref 140–400)
RBC: 5.04 10*6/uL (ref 4.20–5.80)
RDW: 12.2 % (ref 11.0–15.0)
Total Lymphocyte: 23 %
WBC: 5.4 10*3/uL (ref 3.8–10.8)
WBCMIX: 378 {cells}/uL (ref 200–950)

## 2017-12-22 LAB — COMPLETE METABOLIC PANEL WITH GFR
AG RATIO: 1.8 (calc) (ref 1.0–2.5)
ALT: 13 U/L (ref 9–46)
AST: 16 U/L (ref 10–35)
Albumin: 4.6 g/dL (ref 3.6–5.1)
Alkaline phosphatase (APISO): 77 U/L (ref 40–115)
BUN: 16 mg/dL (ref 7–25)
CALCIUM: 9.8 mg/dL (ref 8.6–10.3)
CO2: 27 mmol/L (ref 20–32)
Chloride: 103 mmol/L (ref 98–110)
Creat: 1 mg/dL (ref 0.70–1.25)
GFR, EST AFRICAN AMERICAN: 90 mL/min/{1.73_m2} (ref >=60)
GFR, EST NON AFRICAN AMERICAN: 78 mL/min/{1.73_m2} (ref >=60)
GLOBULIN: 2.6 g/dL (ref 1.9–3.7)
Glucose, Bld: 86 mg/dL (ref 65–99)
POTASSIUM: 5 mmol/L (ref 3.5–5.3)
SODIUM: 138 mmol/L (ref 135–146)
TOTAL PROTEIN: 7.2 g/dL (ref 6.1–8.1)
Total Bilirubin: 0.9 mg/dL (ref 0.2–1.2)

## 2017-12-22 LAB — IRON,TIBC AND FERRITIN PANEL
%SAT: 55 % (calc) — ABNORMAL HIGH (ref 20–48)
Ferritin: 67 ng/mL (ref 24–380)
Iron: 183 ug/dL — ABNORMAL HIGH (ref 50–180)
TIBC: 332 mcg/dL (calc) (ref 250–425)

## 2017-12-22 LAB — LIPID PANEL
CHOL/HDL RATIO: 5 (calc) — AB (ref <5.0)
Cholesterol: 204 mg/dL — ABNORMAL HIGH (ref <200)
HDL: 41 mg/dL (ref >40)
LDL CHOLESTEROL (CALC): 132 mg/dL — AB
NON-HDL CHOLESTEROL (CALC): 163 mg/dL — AB (ref <130)
TRIGLYCERIDES: 170 mg/dL — AB (ref <150)

## 2017-12-22 MED ORDER — ATORVASTATIN CALCIUM 20 MG PO TABS: 20.0000 mg | ORAL_TABLET | Freq: Every day | ORAL | 1 refills | Status: DC

## 2017-12-22 NOTE — Progress Notes (Signed)
Start statin.  

## 2018-01-05 ENCOUNTER — Encounter: Payer: Self-pay | Admitting: Family Medicine

## 2018-01-05 ENCOUNTER — Ambulatory Visit (INDEPENDENT_AMBULATORY_CARE_PROVIDER_SITE_OTHER): Payer: PPO | Admitting: Family Medicine

## 2018-01-05 VITALS — BP 130/76 | HR 73 | Temp 97.9°F | Ht 69.0 in | Wt 176.9 lb

## 2018-01-05 DIAGNOSIS — E7800 Pure hypercholesterolemia, unspecified: Secondary | ICD-10-CM

## 2018-01-05 DIAGNOSIS — I10 Essential (primary) hypertension: Secondary | ICD-10-CM

## 2018-01-05 DIAGNOSIS — K22719 Barrett's esophagus with dysplasia, unspecified: Secondary | ICD-10-CM

## 2018-01-05 NOTE — Patient Instructions (Addendum)
In the future, stop the bergamot if you need to take a certain antibiotic or get out in the sun (increases sun sensitivity) Try to limit saturated fats in your diet (bologna, hot dogs, barbeque, cheeseburgers, hamburgers, steak, bacon, sausage, cheese, etc.) and get more fresh fruits, vegetables, and whole grains Return for just fasting labs 6 weeks after you started the atorvastatin   Cholesterol Cholesterol is a white, waxy, fat-like substance that is needed by the human body in small amounts. The liver makes all the cholesterol we need. Cholesterol is carried from the liver by the blood through the blood vessels. Deposits of cholesterol (plaques) may build up on blood vessel (artery) walls. Plaques make the arteries narrower and stiffer. Cholesterol plaques increase the risk for heart attack and stroke. You cannot feel your cholesterol level even if it is very high. The only way to know that it is high is to have a blood test. Once you know your cholesterol levels, you should keep a record of the test results. Work with your health care provider to keep your levels in the desired range. What do the results mean?  Total cholesterol is a rough measure of all the cholesterol in your blood.  LDL (low-density lipoprotein) is the "bad" cholesterol. This is the type that causes plaque to build up on the artery walls. You want this level to be low.  HDL (high-density lipoprotein) is the "good" cholesterol because it cleans the arteries and carries the LDL away. You want this level to be high.  Triglycerides are fat that the body can either burn for energy or store. High levels are closely linked to heart disease. What are the desired levels of cholesterol?  Total cholesterol below 200.  LDL below 100 for people who are at risk, below 70 for people at very high risk.  HDL above 40 is good. A level of 60 or higher is considered to be protective against heart disease.  Triglycerides below 150. How  can I lower my cholesterol? Diet Follow your diet program as told by your health care provider.  Choose fish or white meat chicken and [place], roasted or baked. Limit fatty cuts of red meat, fried foods, and processed meats, such as sausage and lunch meats.  Eat lots of fresh fruits and vegetables.  Choose whole grains, beans, pasta, potatoes, and cereals.  Choose olive oil, corn oil, or canola oil, and use only small amounts.  Avoid butter, mayonnaise, shortening, or palm kernel oils.  Avoid foods with trans fats.  Drink skim or nonfat milk and eat low-fat or nonfat yogurt and cheeses. Avoid whole milk, cream, ice cream, egg yolks, and full-fat cheeses.  Healthier desserts include angel food cake, ginger snaps, animal crackers, hard candy, popsicles, and low-fat or nonfat frozen yogurt. Avoid pastries, cakes, pies, and cookies.  Exercise  Follow your exercise program as told by your health care provider. A regular program: ? Helps to decrease LDL and raise HDL. ? Helps with weight control.  Do things that increase your activity level, such as gardening, walking, and taking the stairs.  Ask your health care provider about ways that you can be more active in your daily life.  Medicine  Take over-the-counter and prescription medicines only as told by your health care provider. ? Medicine may be prescribed by your health care provider to help lower cholesterol and decrease the risk for heart disease. This is usually done if diet and exercise have failed to bring down cholesterol levels. ?  If you have several risk factors, you may need medicine even if your levels are normal.  This information is not intended to replace advice given to you by your health care provider. Make sure you discuss any questions you have with your health care provider. Document Released: 12/10/2000 Document Revised: 10/13/2015 Document Reviewed: 09/15/2015 Elsevier Interactive Patient Education  [employer].

## 2018-01-05 NOTE — Assessment & Plan Note (Addendum)
Glad he is active and watching his diet; continue medicine; improved since last visit

## 2018-01-05 NOTE — Progress Notes (Signed)
BP 130/76   Pulse 73   Temp 97.9 F (36.6 C)   Ht 5\' 9"  (1.753 m)   Wt 176 lb 14.4 oz (80.2 kg)   SpO2 98%   BMI 26.12 kg/m    Subjective:    Patient ID: [name] [name], male    DOB: March 06, 1951, 67 y.o.   MRN: [id number]  HPI: [name] [name] is a 67 y.o. male  Chief Complaint  Patient presents with  . Follow-up  . Medication Refill    HPI Patient is here for follow-up His blood pressure was elevated; he has been on the new BP medicine for 3-4 days now He has been changing up his diet; no swelling in the legs and no constipation After he got the flu and pneumonia shot, he has felt dizzy and has had a mild headache; does not feel congested, but nose does run like a puppy dog  High cholesterol; he has changed his diet since learning about his levels; he is taking bergamot now; they would go eat barbeque or seafood on date nights; will be more conscious of salt content in his foods; taking atorvastatin He has lost a little weight; used to exercising and would ride his bike for an hour hard; going to the gym too Wife asked about natural supplements; asked about apple cider vinegar Barrett's esophagus; referred to Dr. Gustavo Lah last visit; has upcoming appointment on Oct 22nd He saw Dr. Rockey Situ a few months ago, Feb 2019 Dr. Erlene Quan is managing his prostate  Depression screen [facility] 2/9 01/05/2018 12/21/2017 12/21/2017  Decreased Interest 0 0 0  Down, Depressed, Hopeless 0 0 0  PHQ - 2 Score 0 0 0  Altered sleeping 3 3 -  Tired, decreased energy 3 0 -  Change in appetite 0 0 -  Feeling bad or failure about yourself  0 0 -  Trouble concentrating 0 0 -  Moving slowly or fidgety/restless 0 0 -  Suicidal thoughts 0 0 -  PHQ-9 Score 6 3 -  Difficult doing work/chores Not difficult at all Not difficult at all -   Fall Risk  01/05/2018 12/21/2017  Falls in the past year? No No    Relevant past medical, surgical, family and social history reviewed Past Medical History:  Diagnosis Date    . Depression   . GERD (gastroesophageal reflux disease)   . Heart attack ([place])   . Heart failure ([place])   . Hypertension   . Sleep apnea    Past Surgical History:  Procedure Laterality Date  . HEMORROIDECTOMY  1980   Family History  Problem Relation Age of Onset  . Stroke Father   . Heart attack Father   . Prostate cancer Father   . Heart disease Father   . Dementia Mother   . Heart attack Paternal Grandmother    Social History   Tobacco Use  . Smoking status: Never Smoker  . Smokeless tobacco: Never Used  Substance Use Topics  . Alcohol use: Yes    Alcohol/week: 0.0 standard drinks  . Drug use: No     Office Visit from 01/05/2018 in [facility]  AUDIT-C Score  0      Interim medical history since last visit reviewed. Allergies and medications reviewed  Review of Systems Per HPI unless specifically indicated above     Objective:    BP 130/76   Pulse 73   Temp 97.9 F (36.6 C)   Ht 5\' 9"  (1.753 m)  Wt 176 lb 14.4 oz (80.2 kg)   SpO2 98%   BMI 26.12 kg/m   Wt Readings from Last 3 Encounters:  01/05/18 176 lb 14.4 oz (80.2 kg)  12/21/17 180 lb 6.4 oz (81.8 kg)  09/29/17 179 lb (81.2 kg)    Physical Exam  Constitutional: He appears well-developed and well-nourished. No distress.  Eyes: No scleral icterus.  Cardiovascular: Normal rate and regular rhythm.  Pulmonary/Chest: Effort normal and breath sounds normal.  Neurological: He is alert.  Skin: No pallor.  Psychiatric: He has a normal mood and affect.    Results for orders placed or performed in visit on 12/21/17  COMPLETE METABOLIC PANEL WITH GFR  Result Value Ref Range   Glucose, Bld 86 65 - 99 mg/dL   BUN 16 7 - 25 mg/dL   Creat 1.00 0.70 - 1.25 mg/dL   GFR, Est Non African American 78 > OR = 60 mL/min/1.76m2   GFR, Est African American 90 > OR = 60 mL/min/1.31m2   BUN/Creatinine Ratio NOT APPLICABLE 6 - 22 (calc)   Sodium 138 135 - 146 mmol/L   Potassium 5.0 3.5 - 5.3  mmol/L   Chloride 103 98 - 110 mmol/L   CO2 27 20 - 32 mmol/L   Calcium 9.8 8.6 - 10.3 mg/dL   Total Protein 7.2 6.1 - 8.1 g/dL   Albumin 4.6 3.6 - 5.1 g/dL   Globulin 2.6 1.9 - 3.7 g/dL (calc)   AG Ratio 1.8 1.0 - 2.5 (calc)   Total Bilirubin 0.9 0.2 - 1.2 mg/dL   Alkaline phosphatase (APISO) 77 40 - 115 U/L   AST 16 10 - 35 U/L   ALT 13 9 - 46 U/L  Iron, TIBC and Ferritin Panel  Result Value Ref Range   Iron 183 (H) 50 - 180 mcg/dL   TIBC 332 250 - 425 mcg/dL (calc)   %SAT 55 (H) 20 - 48 % (calc)   Ferritin 67 24 - 380 ng/mL  CBC with Differential/Platelet  Result Value Ref Range   WBC 5.4 3.8 - 10.8 Thousand/uL   RBC 5.04 4.20 - 5.80 Million/uL   Hemoglobin 16.4 13.2 - 17.1 g/dL   HCT 46.0 38.5 - 50.0 %   MCV 91.3 80.0 - 100.0 fL   MCH 32.5 27.0 - 33.0 pg   MCHC 35.7 32.0 - 36.0 g/dL   RDW 12.2 11.0 - 15.0 %   Platelets 241 140 - 400 Thousand/uL   MPV 10.0 7.5 - 12.5 fL   Neutro Abs 3,710 1,500 - 7,800 cells/uL   Lymphs Abs 1,242 850 - 3,900 cells/uL   WBC mixed population 378 200 - 950 cells/uL   Eosinophils Absolute 38 15 - 500 cells/uL   Basophils Absolute 32 0 - 200 cells/uL   Neutrophils Relative % 68.7 %   Total Lymphocyte 23.0 %   Monocytes Relative 7.0 %   Eosinophils Relative 0.7 %   Basophils Relative 0.6 %  Lipid panel  Result Value Ref Range   Cholesterol 204 (H) <200 mg/dL   HDL 41 >40 mg/dL   Triglycerides 170 (H) <150 mg/dL   LDL Cholesterol (Calc) 132 (H) mg/dL (calc)   Total CHOL/HDL Ratio 5.0 (H) <5.0 (calc)   Non-HDL Cholesterol (Calc) 163 (H) <130 mg/dL (calc)      Assessment & Plan:   Problem List Items Addressed This Visit      Cardiovascular and Mediastinum   Essential hypertension (Chronic)    Glad he is active and watching  his diet; continue medicine; improved since last visit        Digestive   Barrett's esophagus with dysplasia (Chronic)    Going to see GI soon        Other   High cholesterol - Primary    Monitor  lipids; limit saturated fats      Relevant Orders   Lipid panel       Follow up plan: Return in about 6 months (around 07/07/2018) for follow-up visit with Dr. Sanda Klein.  An after-visit summary was printed and given to the patient at [place].  Please see the patient instructions which may contain other information and recommendations beyond what is mentioned above in the assessment and plan.  No orders of the defined types were placed in this encounter.   Orders Placed This Encounter  Procedures  . Lipid panel

## 2018-01-05 NOTE — Assessment & Plan Note (Signed)
Going to see GI soon

## 2018-01-07 ENCOUNTER — Telehealth: Payer: Self-pay | Admitting: Family Medicine

## 2018-01-07 NOTE — Telephone Encounter (Signed)
Copied from [place] [id number]. Topic: Quick Communication - Rx Refill/Question >> Jan 07, 2018  2:49 PM Reyne Dumas L wrote: Medication:  triamcinolone cream (KENALOG) 0.1 % hydrocortisone 2.5 % cream  Has the patient contacted their pharmacy? No - states he asked at the office that these be refilled. (Agent: If no, request that the patient contact the pharmacy for the refill.) (Agent: If yes, when and what did the pharmacy advise?)  Preferred Pharmacy (with phone number or street name): [place], [place]. [phone] (Phone) [phone] (Fax)  Agent: Please be advised that RX refills may take up to 3 business days. We ask that you follow-up with your pharmacy.

## 2018-01-08 MED ORDER — HYDROCORTISONE 2.5 % EX CREA: 1.0000 "application " | TOPICAL_CREAM | Freq: Two times a day (BID) | CUTANEOUS | 1 refills | Status: DC | PRN

## 2018-01-08 MED ORDER — TRIAMCINOLONE ACETONIDE 0.1 % EX CREA: 1.0000 "application " | TOPICAL_CREAM | Freq: Every day | CUTANEOUS | 1 refills | Status: DC

## 2018-01-08 NOTE — Addendum Note (Signed)
Addended by: LADA, Satira Anis on: 01/08/2018 12:26 PM   Modules accepted: Orders

## 2018-01-08 NOTE — Telephone Encounter (Signed)
Pt states he got the triamcinolone cream but he also needed the hydrocortisone 2.5 % cream filled and wanted to see if that could be sent in. Please advise

## 2018-01-11 NOTE — Assessment & Plan Note (Signed)
Monitor lipids; limit saturated fats

## 2018-01-19 ENCOUNTER — Ambulatory Visit (INDEPENDENT_AMBULATORY_CARE_PROVIDER_SITE_OTHER): Payer: PPO | Admitting: Nurse Practitioner

## 2018-01-19 ENCOUNTER — Encounter: Payer: Self-pay | Admitting: Nurse Practitioner

## 2018-01-19 VITALS — BP 130/90 | HR 60 | Temp 97.8°F | Resp 16 | Ht 69.0 in | Wt 176.6 lb

## 2018-01-19 DIAGNOSIS — I10 Essential (primary) hypertension: Secondary | ICD-10-CM

## 2018-01-19 DIAGNOSIS — E7800 Pure hypercholesterolemia, unspecified: Secondary | ICD-10-CM | POA: Diagnosis not present

## 2018-01-19 DIAGNOSIS — G8929 Other chronic pain: Secondary | ICD-10-CM

## 2018-01-19 DIAGNOSIS — K2270 Barrett's esophagus without dysplasia: Secondary | ICD-10-CM | POA: Diagnosis not present

## 2018-01-19 DIAGNOSIS — R51 Headache: Secondary | ICD-10-CM

## 2018-01-19 DIAGNOSIS — R42 Dizziness and giddiness: Secondary | ICD-10-CM | POA: Diagnosis not present

## 2018-01-19 DIAGNOSIS — R519 Headache, unspecified: Secondary | ICD-10-CM

## 2018-01-19 DIAGNOSIS — R5383 Other fatigue: Secondary | ICD-10-CM | POA: Diagnosis not present

## 2018-01-19 DIAGNOSIS — Z8371 Family history of colonic polyps: Secondary | ICD-10-CM | POA: Diagnosis not present

## 2018-01-19 NOTE — Progress Notes (Signed)
Name: [name] [name]   MRN: [id number]    DOB: [birth year]   Date:01/19/2018       Progress Note  Subjective  Chief Complaint  Chief Complaint  Patient presents with  . Fatigue  . Headache    constant mild headache  . Dizziness    when changing positions.    HPI  Patient states endorses fatigue, mild headache and orthostatic lightheadedness since starting amlodipine and atorvastatin. Headache is mild frontal- states constant; doesn't notice it when he is sleeping, just aggravating, hasn't tried anything OTC for this. States noticed since being on the medicine he gets tired more easily when he is active- he gets tired faster when at the gym then previously- states he feels like he ran a marathon instead of his routine work out. States when going from laying to standing feels he is off balanced for the first few minutes. States he made a drastic change in his diet; watching salt, cut out red meats.   Lab Results  Component Value Date   CHOL 204 (H) 12/21/2017   HDL 41 12/21/2017   LDLCALC 132 (H) 12/21/2017   TRIG 170 (H) 12/21/2017   CHOLHDL 5.0 (H) 12/21/2017   BP Readings from Last 3 Encounters:  01/19/18 130/90  01/05/18 130/76  12/21/17 (!) 150/92   No paresthesia, weakness, has chronic vision problems- feels like medication has made it worse. No nausea, vomiting, palpitations, shortness of breath.   Regularly sees Dr. Joellyn Rued- for vision.   Patient Active Problem List   Diagnosis Date Noted  . Elevated PSA 12/21/2017  . High cholesterol 12/21/2017  . Barrett's esophagus with dysplasia 12/21/2017  . Medication monitoring encounter 12/21/2017  . Syncope 05/20/2017  . Abnormal EKG 05/20/2017  . Obstructive sleep apnea 05/20/2017  . Essential hypertension 05/20/2017    Past Medical History:  Diagnosis Date  . Depression   . GERD (gastroesophageal reflux disease)   . Heart attack ([place])   . Heart failure ([place])   . Hypertension   . Sleep apnea     Past Surgical  History:  Procedure Laterality Date  . HEMORROIDECTOMY  1980    Social History   Tobacco Use  . Smoking status: Never Smoker  . Smokeless tobacco: Never Used  Substance Use Topics  . Alcohol use: Yes    Alcohol/week: 0.0 standard drinks     Current Outpatient Medications:  .  amLODipine (NORVASC) 5 MG tablet, Take 1 tablet (5 mg total) by mouth daily., Disp: 90 tablet, Rfl: 3 .  atorvastatin (LIPITOR) 20 MG tablet, Take 1 tablet (20 mg total) by mouth at bedtime., Disp: 30 tablet, Rfl: 1 .  CIALIS 10 MG tablet, , Disp: , Rfl: 0 .  CITRUS BERGAMOT PO, Take 1 tablet by mouth daily., Disp: , Rfl:  .  Garlic Oil 9767 MG CAPS, Take 1 capsule by mouth daily., Disp: , Rfl:  .  [place]orn Berries 565 MG CAPS, Take 1 capsule by mouth daily., Disp: , Rfl:  .  hydrocortisone 2.5 % cream, Apply 1 application topically 2 (two) times daily as needed., Disp: 30 g, Rfl: 1 .  omeprazole (PRILOSEC) 20 MG capsule, Take 20 mg by mouth daily., Disp: , Rfl:  .  tamsulosin (FLOMAX) 0.4 MG CAPS capsule, Take 0.4 mg by mouth daily. , Disp: , Rfl: 4 .  triamcinolone cream (KENALOG) 0.1 %, Apply 1 application topically daily. If needed; too strong for face, groin, under arms, Disp: 30 g, Rfl: 1  No  Known Allergies  ROS   No other specific complaints in a complete review of systems (except as listed in HPI above).  Objective  Vitals:   01/19/18 0852 01/19/18 0905 01/19/18 0907 01/19/18 0908  BP: 130/80 130/82 (!) 110/100 130/90  Pulse: 75 (!) 50 62 60  Resp: 16     Temp: 97.8 F (36.6 C)     TempSrc: Oral     SpO2: 98%     Weight: 176 lb 9.6 oz (80.1 kg)     Height: 5\' 9"  (1.753 m)        Body mass index is 26.08 kg/m.  Nursing Note and Vital Signs reviewed.  Physical Exam  Constitutional: He is oriented to person, place, and time. He appears well-developed and well-nourished.  HENT:  Head: Normocephalic and atraumatic.  Eyes: Pupils are equal, round, and reactive to light. EOM are  normal. Right eye exhibits no nystagmus. Left eye exhibits no nystagmus.  Neck: Normal range of motion. Neck supple. Carotid bruit is not present.  Cardiovascular: Normal heart sounds and intact distal pulses.  Pulmonary/Chest: Effort normal and breath sounds normal.  Abdominal: Soft. Bowel sounds are normal. There is no tenderness. There is no CVA tenderness.  Musculoskeletal: Normal range of motion.  Neurological: He is alert and oriented to person, place, and time. He has normal strength. No sensory deficit. Coordination and gait normal. GCS eye subscore is 4. GCS verbal subscore is 5. GCS motor subscore is 6.  No limb ataxia   Skin: Skin is warm, dry and intact. Capillary refill takes less than 2 seconds.  Psychiatric: He has a normal mood and affect. His speech is normal and behavior is normal. Judgment and thought content normal.  Vitals reviewed.     No results found for this or any previous visit (from the past 48 hour(s)).  Assessment & Plan  1. Essential hypertension Controlled, decrease amlodipine due to orthostatic symptoms   2. Dizziness - orthostatic, fluids, decreased amlodipine to 2.5mg  daily   3. Fatigue, unspecified type - hydration, take atorvastatin every other day   4. Chronic nonintractable headache, unspecified headache type Fluids, OTC acetaminophen; discussed red flags  5. High cholesterol Take atorvastatin every other day; continue healthy lifestyle changes  Reviewed all previous labs, counseled and encouraged patient on healthy lifestyle    Follow up in 3 weeks for blood pressure and lipid recheck and re-evaluate symptoms -Red flags and when to present for emergency care or RTC including fever >101.48F, chest pain, shortness of breath, new/worsening/un-resolving symptoms, reviewed with patient at time of visit. Follow up and care instructions discussed and provided in AVS.

## 2018-01-19 NOTE — Patient Instructions (Addendum)
-   Goal is to drink 64 ounces of water; 8 glasses of water a day - Take atorvastatin every other day  - Talk half dose of amlodipine (2.5 mg) daily.  - Please set up appointment with eye doctor within the next 2 weeks.   Your goal blood pressure is less than 150 mmHg on top. Try to follow the DASH guidelines (DASH stands for Dietary Approaches to Stop Hypertension) Try to limit the sodium in your diet.  Ideally, consume less than 1.5 grams (less than 1,500mg ) per day. Do not add salt when cooking or at the table.  Check the sodium amount on labels when shopping, and choose items lower in sodium when given a choice. Avoid or limit foods that already contain a lot of sodium. Eat a diet rich in fruits and vegetables and whole grains.

## 2018-01-28 ENCOUNTER — Other Ambulatory Visit: Payer: Self-pay

## 2018-01-28 DIAGNOSIS — E7800 Pure hypercholesterolemia, unspecified: Secondary | ICD-10-CM | POA: Diagnosis not present

## 2018-01-29 LAB — LIPID PANEL
CHOLESTEROL: 141 mg/dL (ref <200)
HDL: 49 mg/dL (ref >40)
LDL CHOLESTEROL (CALC): 76 mg/dL
Non-HDL Cholesterol (Calc): 92 mg/dL (calc) (ref <130)
Total CHOL/HDL Ratio: 2.9 (calc) (ref <5.0)
Triglycerides: 82 mg/dL (ref <150)

## 2018-02-22 ENCOUNTER — Ambulatory Visit (INDEPENDENT_AMBULATORY_CARE_PROVIDER_SITE_OTHER): Payer: PPO | Admitting: Family Medicine

## 2018-02-22 ENCOUNTER — Ambulatory Visit: Payer: Self-pay

## 2018-02-22 ENCOUNTER — Encounter: Payer: Self-pay | Admitting: Family Medicine

## 2018-02-22 VITALS — BP 144/80 | HR 58 | Temp 98.1°F | Ht 69.0 in | Wt 179.2 lb

## 2018-02-22 DIAGNOSIS — K22719 Barrett's esophagus with dysplasia, unspecified: Secondary | ICD-10-CM

## 2018-02-22 DIAGNOSIS — E7800 Pure hypercholesterolemia, unspecified: Secondary | ICD-10-CM | POA: Diagnosis not present

## 2018-02-22 DIAGNOSIS — R6889 Other general symptoms and signs: Secondary | ICD-10-CM | POA: Diagnosis not present

## 2018-02-22 DIAGNOSIS — I10 Essential (primary) hypertension: Secondary | ICD-10-CM | POA: Diagnosis not present

## 2018-02-22 MED ORDER — ATORVASTATIN CALCIUM 20 MG PO TABS: 20.0000 mg | ORAL_TABLET | ORAL | 1 refills | Status: DC

## 2018-02-22 NOTE — Patient Instructions (Addendum)
Back down on your exercise for the time-being until you can get thoroughly checked out by Dr. Rockey Situ Your maximum heart rate at 85% of your target is 130 beats per minute Let's reintroduce the cholesterol medicine atorvastatin every OTHER day and then stop it and call me if any symptoms bother you Try to limit saturated fats in your diet (bologna, hot dogs, barbeque, cheeseburgers, hamburgers, steak, bacon, sausage, cheese, etc.) and get more fresh fruits, vegetables, and whole grains Recheck labs in 6-8 weeks on the new regimen

## 2018-02-22 NOTE — Assessment & Plan Note (Signed)
Will reinstitute statin every other day

## 2018-02-22 NOTE — Assessment & Plan Note (Signed)
Back on PPI; going to have EGD soon with GI

## 2018-02-22 NOTE — Telephone Encounter (Signed)
Left detailed voicemail

## 2018-02-22 NOTE — Progress Notes (Signed)
BP (!) 150/82   Pulse (!) 58   Temp 98.1 F (36.7 C) (Oral)   Ht 5\' 9"  (1.753 m)   Wt 179 lb 3.2 oz (81.3 kg)   SpO2 98%   BMI 26.46 kg/m    Subjective:    Patient ID: [name] [name], male    DOB: [birth year], 67 y.o.   MRN: [id number]  HPI: [name] [name] is a 67 y.o. male  Chief Complaint  Patient presents with  . Follow-up    pt stopped bp and chol med states he was tired and dizzy all the time    HPI Patient is here f/u  He was tired and dizzy all the time; he quit his cholesterol medicine and BP medicine He would try to do an exercise bike for an hour and lift weights, but couldn't finish Took longer to recovery; tired all day; felt like he ran a marathon; felt so fatired In the morning, he would feel fine; just exhausted after working out He stopped the medicines on Nov 9th and after 2 week washout, back to normal Felt like a limp dish rag Good eater; exercising; father had heart attack in his 83s; he smoked and was heavy for his height He brought in the BP readings from home: 136/77, pulse 56 127/67, pulse 60 135/74, pulse 62 117/69, pulse 56 135/72, pulse 52 Etc. He would monitor his heart rate at the gym Heart rate going up to 155 or so highest Decreased exercise tolerance, though no chest pain; has a cardiologist, but it's been since February Barretts esophagus; going for EGD in Jan; no blood in the stool; no particular triggers; used to have problems with caffeine before going back on the medicine  Depression screen [facility] 2/9 02/22/2018 01/19/2018 01/05/2018 12/21/2017 12/21/2017  Decreased Interest 0 0 0 0 0  Down, Depressed, Hopeless 0 0 0 0 0  PHQ - 2 Score 0 0 0 0 0  Altered sleeping 0 - 3 3 -  Tired, decreased energy 0 - 3 0 -  Change in appetite 0 - 0 0 -  Feeling bad or failure about yourself  0 - 0 0 -  Trouble concentrating 0 - 0 0 -  Moving slowly or fidgety/restless 0 - 0 0 -  Suicidal thoughts 0 - 0 0 -  PHQ-9 Score 0 - 6 3 -  Difficult doing  work/chores Not difficult at all - Not difficult at all Not difficult at all -   Fall Risk  02/22/2018 01/19/2018 01/05/2018 12/21/2017  Falls in the past year? 0 No No No  Number falls in past yr: 0 - - -    Relevant past medical, surgical, family and social history reviewed Past Medical History:  Diagnosis Date  . Depression   . GERD (gastroesophageal reflux disease)   . Heart attack ([place])   . Heart failure ([place])   . Hypertension   . Sleep apnea    Past Surgical History:  Procedure Laterality Date  . HEMORROIDECTOMY  1980   Family History  Problem Relation Age of Onset  . Stroke Father   . Heart attack Father   . Prostate cancer Father   . Heart disease Father   . Dementia Mother   . Heart attack Paternal Grandmother    Social History   Tobacco Use  . Smoking status: Never Smoker  . Smokeless tobacco: Never Used  Substance Use Topics  . Alcohol use: Yes    Alcohol/week: 0.0 standard drinks  .  Drug use: No     Office Visit from 02/22/2018 in [facility]  AUDIT-C Score  0      Interim medical history since last visit reviewed. Allergies and medications reviewed  Review of Systems  Respiratory: Negative for shortness of breath.   Cardiovascular: Negative for chest pain and leg swelling.  Musculoskeletal:       Fatigue   Per HPI unless specifically indicated above     Objective:    BP (!) 150/82   Pulse (!) 58   Temp 98.1 F (36.7 C) (Oral)   Ht 5\' 9"  (1.753 m)   Wt 179 lb 3.2 oz (81.3 kg)   SpO2 98%   BMI 26.46 kg/m   Wt Readings from Last 3 Encounters:  02/22/18 179 lb 3.2 oz (81.3 kg)  01/19/18 176 lb 9.6 oz (80.1 kg)  01/05/18 176 lb 14.4 oz (80.2 kg)    Physical Exam  Constitutional: He appears well-developed and well-nourished. No distress.  HENT:  Head: Normocephalic and atraumatic.  Eyes: EOM are normal. No scleral icterus.  Neck: No thyromegaly present.  Cardiovascular: Regular rhythm. Bradycardia present.    Pulmonary/Chest: Effort normal and breath sounds normal.  Abdominal: Soft. Bowel sounds are normal. He exhibits no distension.  Musculoskeletal: He exhibits no edema.  Neurological: Coordination normal.  Skin: Skin is warm and dry. No pallor.  Psychiatric: He has a normal mood and affect. His behavior is normal. Judgment and thought content normal.    Results for orders placed or performed in visit on 01/28/18  Lipid panel  Result Value Ref Range   Cholesterol 141 <200 mg/dL   HDL 49 >40 mg/dL   Triglycerides 82 <150 mg/dL   LDL Cholesterol (Calc) 76 mg/dL (calc)   Total CHOL/HDL Ratio 2.9 <5.0 (calc)   Non-HDL Cholesterol (Calc) 92 <130 mg/dL (calc)      Assessment & Plan:   Problem List Items Addressed This Visit      Cardiovascular and Mediastinum   Essential hypertension (Chronic)    Controlled; continue exercise, but not above heart rate of 130 (85% of maximum)      Relevant Medications   atorvastatin (LIPITOR) 20 MG tablet   Other Relevant Orders   Ambulatory referral to Cardiology     Digestive   Barrett's esophagus with dysplasia (Chronic)    Back on PPI; going to have EGD soon with GI        Other   High cholesterol    Will reinstitute statin every other day      Relevant Medications   atorvastatin (LIPITOR) 20 MG tablet   Other Relevant Orders   Lipid panel    Other Visit Diagnoses    Decreased exercise tolerance    -  Primary   Relevant Orders   Ambulatory referral to Cardiology       Follow up plan: Return in about 6 months (around 08/23/2018).  An after-visit summary was printed and given to the patient at [place].  Please see the patient instructions which may contain other information and recommendations beyond what is mentioned above in the assessment and plan.  Meds ordered this encounter  Medications  . atorvastatin (LIPITOR) 20 MG tablet    Sig: Take 1 tablet (20 mg total) by mouth every other day.    Dispense:  45 tablet     Refill:  1    Orders Placed This Encounter  Procedures  . Lipid panel  . Ambulatory referral to Cardiology

## 2018-02-22 NOTE — Assessment & Plan Note (Signed)
Controlled; continue exercise, but not above heart rate of 130 (85% of maximum)

## 2018-02-22 NOTE — Telephone Encounter (Signed)
We talked today about having him try the statin every other day His risk of a heart attack or stroke is above normal, with his age and gender part of that equation I would have like him on daily statin therapy, but we're trying to get him back on every other day first to see if he tolerates it His risk in September based on those labs was 23% over the next 10 years His most current risk was 14.6% over the next 10 years, still well above the 7.5% risk threshold where we strongly recommend statin therapy If he can tolerate daily statin therapy, I'd like to get him back there   The 10-year ASCVD risk score Mikey Bussing DC Brooke Bonito., et al., 2013) is: 14.6%   Values used to calculate the score:     Age: 67 years     Sex: Male     Is Non-Hispanic African American: No     Diabetic: No     Tobacco smoker: No     Systolic Blood Pressure: 254 mmHg     Is BP treated: No     HDL Cholesterol: 49 mg/dL     Total Cholesterol: 141 mg/dL

## 2018-02-22 NOTE — Telephone Encounter (Signed)
Pt called with questions about his lipid panel and why is he on atorvastatin. Pt viewed his results on MyChart form 3 weeks ago and asked if his levels are normal can he stop the atorvastatin.  Pt asked is there a different goal for his lipids that he is not aware of and is that why he is still on the Atorvastatin.   Reason for Disposition . [1] Caller requesting NON-URGENT health information AND [2] PCP's office is the best resource  Protocols used: INFORMATION ONLY CALL-A-AH

## 2018-02-23 NOTE — Progress Notes (Signed)
Cardiology Office Note  Date:  02/24/2018   ID:  [name] [name], DOB 04/13/1950, MRN [id number]  PCP:  [name] Courser, MD   Chief Complaint  Patient presents with  . other    Ref by Dr. Sanda [name] for decreased exercise tolerance and HTN. Meds reviewed by the pt. verbally.      HPI:  [name] [name] is a 67 yo male with past medical history of OSA, not on cpap, snores a little  HTN Nonsmoker No diabetes Depression /anxiety Labile blood pressure Who presents for f/u of his  Syncope and hypertension  Blood pressure elevated on today's visit, Feels it is secondary to anxiety Blood pressures at home well controlled by his report Recently on amlodipine 5 mg daily, did not feel well and stop the medication November 9  Recent office visit with primary care systolic pressure 465 Even on recheck today blood pressure 170  Reports he works out at [employer] on a regular basis Likes to do sprint workouts, interval training Heart rate up to 155, when he pushes himself to the limit Denies any chest pain or shortness of breath  EKG personally reviewed by myself on todays visit Shows normal sinus rhythm with rate 52 bpm no significant ST or T wave changes  Other past medical history reviewed Previously [name] [name], at [place] [facility], had a severe viral URI Was dehydrated, not eating well, flu symptoms Boarded a plane on Wednesday. Trip [name] [name] to [name] [name],  Lady next to her with perfume, felt that the strong odor affected him Sitting window seat, got up to go to bathroom, Felt lightheaded on his way to the bathroom, felt himself go down woke up on the ground To try to get up again and again fell down onto the ground with dizziness Drank orange juice Reported having low pulse, Able to recover sit back in the chair for the rest of the flight  Total cholesterol 189, LDL 123     PMH:   has a past medical history of Depression, GERD (gastroesophageal reflux disease), Heart attack ([place]), Heart failure ([place]),  Hypertension, and Sleep apnea.  PSH:    Past Surgical History:  Procedure Laterality Date  . HEMORROIDECTOMY  1980    Current Outpatient Medications  Medication Sig Dispense Refill  . atorvastatin (LIPITOR) 20 MG tablet Take 1 tablet (20 mg total) by mouth every other day. 45 tablet 1  . CIALIS 10 MG tablet Take 5 mg by mouth daily as needed.   0  . CITRUS BERGAMOT PO Take 1 tablet by mouth daily.    . Garlic Oil 0354 MG CAPS Take 1 capsule by mouth daily.    Marland Kitchen Hawthorn Berries 565 MG CAPS Take 1 capsule by mouth daily.    . hydrocortisone 2.5 % cream Apply 1 application topically 2 (two) times daily as needed. 30 g 1  . omeprazole (PRILOSEC) 20 MG capsule Take 20 mg by mouth daily.    . tamsulosin (FLOMAX) 0.4 MG CAPS capsule Take 0.4 mg by mouth daily.   4  . triamcinolone cream (KENALOG) 0.1 % Apply 1 application topically daily. If needed; too strong for face, groin, under arms 30 g 1   No current facility-administered medications for this visit.      Allergies:   Patient has no known allergies.   Social History:  The patient  reports that he has never smoked. He has never used smokeless tobacco. He reports that he drinks alcohol. He reports that he does not use  drugs.   Family History:   family history includes Dementia in his mother; Heart attack in his father and paternal grandmother; Heart disease in his father; Prostate cancer in his father; Stroke in his father.    Review of Systems: Review of Systems  Constitutional: Negative.   Respiratory: Negative.   Cardiovascular: Negative.   Gastrointestinal: Negative.   Musculoskeletal: Negative.   Neurological: Negative.   Psychiatric/Behavioral: Negative.   All other systems reviewed and are negative.    PHYSICAL EXAM: VS:  BP (!) 170/100 (BP Location: Left Arm, Patient Position: Sitting, Cuff Size: Normal)   Pulse (!) 52   Ht 5\' 10"  (1.778 m)   Wt 179 lb 8 oz (81.4 kg)   BMI 25.76 kg/m  , BMI Body mass index is  25.76 kg/m. Constitutional:  oriented to person, place, and time. No distress.  HENT:  Head: Grossly normal Eyes:  no discharge. No scleral icterus.  Neck: No JVD, no carotid bruits  Cardiovascular: Regular rate and rhythm, no murmurs appreciated Pulmonary/Chest: Clear to auscultation bilaterally, no wheezes or rails Abdominal: Soft.  no distension.  no tenderness.  Musculoskeletal: Normal range of motion Neurological:  normal muscle tone. Coordination normal. No atrophy Skin: Skin warm and dry Psychiatric: Mildly anxious   Recent Labs: 12/21/2017: ALT 13; BUN 16; Creat 1.00; Hemoglobin 16.4; Platelets 241; Potassium 5.0; Sodium 138    Lipid Panel Lab Results  Component Value Date   CHOL 141 01/28/2018   HDL 49 01/28/2018   LDLCALC 76 01/28/2018   TRIG 82 01/28/2018   Numbers detailed above  Wt Readings from Last 3 Encounters:  02/24/18 179 lb 8 oz (81.4 kg)  02/22/18 179 lb 3.2 oz (81.3 kg)  01/19/18 176 lb 9.6 oz (80.1 kg)       ASSESSMENT AND PLAN:  Chest pain, unspecified type - Plan: EKG 12-Lead Denies any chest pain symptoms even on heavy exertion at the gym No further work-up  Vasovagal syncope In the setting of viral URI, no further episodes since trip to [place]  Obstructive sleep apnea Unable to wear CPAP Patient does not think it is a big problem  Essential hypertension Markedly elevated blood pressure on today's visit, appears very anxious Reassurance provided concerning his gym workouts and exercise regiment No restrictions indicated Did not tolerate amlodipine, felt dizzy, weak No medication changes made given documented numbers from home that appear relatively well controlled He will call us if blood pressure continues to run high   Total encounter time more than 25 minutes  Greater than 50% was spent in counseling and coordination of care with the patient  Disposition:   F/U as needed   Orders Placed This Encounter  Procedures  . EKG  12-Lead     Signed, Esmond Plants, M.D., Ph.D. 02/24/2018  [place], [place]

## 2018-02-24 ENCOUNTER — Encounter: Payer: Self-pay | Admitting: Cardiovascular Disease

## 2018-02-24 ENCOUNTER — Ambulatory Visit (INDEPENDENT_AMBULATORY_CARE_PROVIDER_SITE_OTHER): Payer: PPO | Admitting: Cardiovascular Disease

## 2018-02-24 VITALS — BP 170/100 | HR 52 | Ht 70.0 in | Wt 179.5 lb

## 2018-02-24 DIAGNOSIS — R55 Syncope and collapse: Secondary | ICD-10-CM

## 2018-02-24 DIAGNOSIS — R9431 Abnormal electrocardiogram [ECG] [EKG]: Secondary | ICD-10-CM

## 2018-02-24 DIAGNOSIS — I10 Essential (primary) hypertension: Secondary | ICD-10-CM

## 2018-02-24 DIAGNOSIS — R079 Chest pain, unspecified: Secondary | ICD-10-CM

## 2018-02-24 DIAGNOSIS — G4733 Obstructive sleep apnea (adult) (pediatric): Secondary | ICD-10-CM | POA: Diagnosis not present

## 2018-02-24 NOTE — Patient Instructions (Addendum)
Please call with blood pressure measurements if it continues to run high Goal systolic pressure 948   Medication Instructions:  No changes  If you need a refill on your cardiac medications before your next appointment, please call your pharmacy.    Lab work: No new labs needed   If you have labs (blood work) drawn today and your tests are completely normal, you will receive your results only by: Marland Kitchen MyChart Message (if you have MyChart) OR . A paper copy in the mail If you have any lab test that is abnormal or we need to change your treatment, we will call you to review the results.   Testing/Procedures: No new testing needed    Follow-Up: At [facility], you and your health needs are our priority.  As part of our continuing mission to provide you with exceptional heart care, we have created designated Provider Care Teams.  These Care Teams include your primary Cardiologist (physician) and Advanced Practice Providers (APPs -  Physician Assistants and Nurse Practitioners) who all work together to provide you with the care you need, when you need it.  . You will need a follow up appointment as needed  . Providers on your designated Care Team:   . Murray Hodgkins, NP . Christell Faith, PA-C . Marrianne Mood, PA-C  Any Other Special Instructions Will Be Listed Below (If Applicable).  For educational health videos Log in to : [email or web address] Or : [email or web address], password : [email or web address]

## 2018-03-29 ENCOUNTER — Other Ambulatory Visit: Payer: Self-pay | Admitting: Family Medicine

## 2018-03-29 NOTE — Telephone Encounter (Signed)
Copied from [place] [id number]. Topic: Quick Communication - Rx Refill/Question >> Mar 29, 2018 10:40 AM Oneta Rack wrote:  Medication: tamsulosin (FLOMAX) 0.4 MG CAPS capsule   Has the patient contacted their pharmacy? Yes   (Agent: If yes, when and what did the pharmacy advise?) Pharmacy is on the line checking on the status of med request fax over on 03/22/18  Preferred Pharmacy (with phone number or street name):  [place], [place]. [phone] (Phone) [phone] (Fax)   Agent: Please be advised that RX refills may take up to 3 business days. We ask that you follow-up with your pharmacy.

## 2018-03-29 NOTE — Telephone Encounter (Signed)
Medication request for Flomax / This medication was prescribed by another provider, however patient states he did discuss it with Dr. Sanda Klein when he established care / Will send to provider for review

## 2018-03-29 NOTE — Telephone Encounter (Signed)
Pt called back and stated that the [facility] had a doctor who saw all the employees. That doctor is no longer with the city and pt is no longer an employee. The specialist Dr. Harmon Pier did not write the rx either. Pt wants to know if Dr. Sanda Klein could refill for him. Stated he has been taking medication for several years and it's not something new. He did mention it when Dr. Sanda Klein became his PCP. Please advise

## 2018-03-30 NOTE — Telephone Encounter (Signed)
If the patient has a urologist, I'd prefer that his urologist prescribe and monitor his urology medicines I'll forward this to Dr. Erlene Quan

## 2018-04-01 ENCOUNTER — Encounter
Admission: RE | Disposition: A | Discharge status: Home / Self Care | Payer: Self-pay | Source: Home / Self Care | Attending: Gastroenterology

## 2018-04-01 ENCOUNTER — Ambulatory Visit: Payer: PPO | Admitting: Anesthesiology

## 2018-04-01 ENCOUNTER — Telehealth: Payer: Self-pay | Admitting: Urology

## 2018-04-01 ENCOUNTER — Encounter: Payer: Self-pay | Admitting: *Deleted

## 2018-04-01 ENCOUNTER — Ambulatory Visit
Admission: RE | Admit: 2018-04-01 | Discharge: 2018-04-01 | Disposition: A | Discharge status: Home / Self Care | Payer: PPO | Attending: Gastroenterology | Admitting: Gastroenterology

## 2018-04-01 DIAGNOSIS — I252 Old myocardial infarction: Secondary | ICD-10-CM | POA: Diagnosis not present

## 2018-04-01 DIAGNOSIS — Z8719 Personal history of other diseases of the digestive system: Secondary | ICD-10-CM | POA: Diagnosis not present

## 2018-04-01 DIAGNOSIS — Z1211 Encounter for screening for malignant neoplasm of colon: Secondary | ICD-10-CM | POA: Diagnosis not present

## 2018-04-01 DIAGNOSIS — K2270 Barrett's esophagus without dysplasia: Secondary | ICD-10-CM | POA: Insufficient documentation

## 2018-04-01 DIAGNOSIS — I110 Hypertensive heart disease with heart failure: Secondary | ICD-10-CM | POA: Diagnosis not present

## 2018-04-01 DIAGNOSIS — K219 Gastro-esophageal reflux disease without esophagitis: Secondary | ICD-10-CM | POA: Insufficient documentation

## 2018-04-01 DIAGNOSIS — Z8371 Family history of colonic polyps: Secondary | ICD-10-CM | POA: Diagnosis not present

## 2018-04-01 DIAGNOSIS — K635 Polyp of colon: Secondary | ICD-10-CM | POA: Diagnosis not present

## 2018-04-01 DIAGNOSIS — F329 Major depressive disorder, single episode, unspecified: Secondary | ICD-10-CM | POA: Diagnosis not present

## 2018-04-01 DIAGNOSIS — D124 Benign neoplasm of descending colon: Secondary | ICD-10-CM | POA: Insufficient documentation

## 2018-04-01 DIAGNOSIS — K5790 Diverticulosis of intestine, part unspecified, without perforation or abscess without bleeding: Secondary | ICD-10-CM | POA: Diagnosis not present

## 2018-04-01 DIAGNOSIS — K2970 Gastritis, unspecified, without bleeding: Secondary | ICD-10-CM | POA: Diagnosis not present

## 2018-04-01 DIAGNOSIS — K5730 Diverticulosis of large intestine without perforation or abscess without bleeding: Secondary | ICD-10-CM | POA: Diagnosis not present

## 2018-04-01 DIAGNOSIS — G4730 Sleep apnea, unspecified: Secondary | ICD-10-CM | POA: Insufficient documentation

## 2018-04-01 DIAGNOSIS — K449 Diaphragmatic hernia without obstruction or gangrene: Secondary | ICD-10-CM | POA: Diagnosis not present

## 2018-04-01 DIAGNOSIS — D123 Benign neoplasm of transverse colon: Secondary | ICD-10-CM | POA: Diagnosis not present

## 2018-04-01 DIAGNOSIS — I509 Heart failure, unspecified: Secondary | ICD-10-CM | POA: Diagnosis not present

## 2018-04-01 DIAGNOSIS — K2950 Unspecified chronic gastritis without bleeding: Secondary | ICD-10-CM | POA: Insufficient documentation

## 2018-04-01 DIAGNOSIS — D122 Benign neoplasm of ascending colon: Secondary | ICD-10-CM | POA: Diagnosis not present

## 2018-04-01 DIAGNOSIS — Z79899 Other long term (current) drug therapy: Secondary | ICD-10-CM | POA: Diagnosis not present

## 2018-04-01 DIAGNOSIS — Z86010 Personal history of colonic polyps: Secondary | ICD-10-CM | POA: Diagnosis not present

## 2018-04-01 DIAGNOSIS — K640 First degree hemorrhoids: Secondary | ICD-10-CM | POA: Diagnosis not present

## 2018-04-01 HISTORY — PX: COLONOSCOPY WITH PROPOFOL: SHX5780

## 2018-04-01 HISTORY — PX: ESOPHAGOGASTRODUODENOSCOPY: SHX5428

## 2018-04-01 SURGERY — EGD (ESOPHAGOGASTRODUODENOSCOPY)
Anesthesia: General

## 2018-04-01 MED ORDER — GLYCOPYRROLATE 0.2 MG/ML IJ SOLN
INTRAMUSCULAR | Status: DC | PRN
  Administered 2018-04-01: 0.2 mg via INTRAVENOUS

## 2018-04-01 MED ORDER — PROPOFOL 500 MG/50ML IV EMUL
INTRAVENOUS | Status: DC | PRN
  Administered 2018-04-01: 175 ug/kg/min via INTRAVENOUS

## 2018-04-01 MED ORDER — SODIUM CHLORIDE 0.9 % IV SOLN
INTRAVENOUS | Status: DC
  Administered 2018-04-01: 1000 mL via INTRAVENOUS

## 2018-04-01 MED ORDER — PROPOFOL 10 MG/ML IV BOLUS
INTRAVENOUS | Status: DC | PRN
  Administered 2018-04-01: 80 mg via INTRAVENOUS

## 2018-04-01 MED ORDER — TAMSULOSIN HCL 0.4 MG PO CAPS: 0.4000 mg | ORAL_CAPSULE | Freq: Every day | ORAL | 3 refills | Status: DC

## 2018-04-01 MED ORDER — LIDOCAINE HCL (PF) 2 % IJ SOLN
INTRAMUSCULAR | Status: DC | PRN
  Administered 2018-04-01: 100 mg via INTRADERMAL

## 2018-04-01 NOTE — Telephone Encounter (Signed)
Sent refills to pt's pharmacy. Pt was made aware. He had no additional questions. Nothing further is needed

## 2018-04-01 NOTE — Telephone Encounter (Signed)
Pt's wife calling to find out why Dr. Sanda Klein will not fill pt's medication.  States that if she can't refill medication maybe she is not the right doctor for the patient. Stanton Kidney can be reached at [phone].

## 2018-04-01 NOTE — Anesthesia Postprocedure Evaluation (Signed)
Anesthesia Post Note  Patient: TYRIN HERBERS  Procedure(s) Performed: ESOPHAGOGASTRODUODENOSCOPY (EGD) (N/A ) COLONOSCOPY WITH PROPOFOL (N/A )  Patient location during evaluation: PACU Anesthesia Type: General Level of consciousness: awake and alert Pain management: pain level controlled Vital Signs Assessment: post-procedure vital signs reviewed and stable Respiratory status: spontaneous breathing, nonlabored ventilation, respiratory function stable and patient connected to nasal cannula oxygen Cardiovascular status: blood pressure returned to baseline and stable Postop Assessment: no apparent nausea or vomiting Anesthetic complications: no     Last Vitals:  Vitals:   04/01/18 0910 04/01/18 0947  BP: 134/85 119/70  Pulse: 64 64  Resp: 15 (!) 26  Temp:    SpO2: 100% 100%    Last Pain:  Vitals:   04/01/18 0850  TempSrc: Tympanic  PainSc:                  Durenda Hurt

## 2018-04-01 NOTE — Telephone Encounter (Signed)
Patient states does not want to get through urologist and has been on this med for years?

## 2018-04-01 NOTE — Op Note (Addendum)
[facility] Patient Name: [name] [name] Procedure Date: 04/01/2018 7:07 AM MRN: [id number] Account #: [id number] Date of Birth: 21-Apr-1950 Admit Type: Outpatient Age: 68 Room: [facility] ENDO ROOM 1 Gender: Male Note Status: Finalized Procedure:            Colonoscopy Indications:          Family history of colonic polyps in a first-degree                        relative Providers:            Lollie Sails, MD Referring MD:         Arnetha Courser (Referring MD) Medicines:            Monitored Anesthesia Care Complications:        No immediate complications. Hemostasis good at all                        sites. Procedure:            Pre-Anesthesia Assessment:                       - ASA Grade Assessment: III - A patient with severe                        systemic disease.                       After obtaining informed consent, the colonoscope was                        passed under direct vision. Throughout the procedure,                        the patient's blood pressure, pulse, and oxygen                        saturations were monitored continuously. The was                        introduced through the anus and advanced to the the                        cecum, identified by appendiceal orifice and ileocecal                        valve. The colonoscopy was performed without                        difficulty. The patient tolerated the procedure well.                        The quality of the bowel preparation was good. Findings:      A 11 mm polyp was found in the hepatic flexure. The polyp was sessile.       The polyp was removed with a cold snare. Resection and retrieval were       complete.      A 5 mm polyp was found in the proximal descending colon. The polyp was       sessile. The polyp was removed with a cold snare. Resection and  retrieval were complete.      A few small-mouthed diverticula were found in the sigmoid colon.      The  retroflexed view of the distal rectum and anal verge was normal and       showed no anal or rectal abnormalities.      Non-bleeding internal hemorrhoids were found during anoscopy. The       hemorrhoids were small and Grade I (internal hemorrhoids that do not       prolapse). Impression:           - One 11 mm polyp at the hepatic flexure, removed with                        a cold snare. Resected and retrieved.                       - One 5 mm polyp in the proximal descending colon,                        removed with a cold snare. Resected and retrieved.                       - Diverticulosis in the sigmoid colon.                       - The distal rectum and anal verge are normal on                        retroflexion view.                       - Non-bleeding internal hemorrhoids. Recommendation:       - Await pathology results.                       - Soft diet today, then advance as tolerated to advance                        diet as tolerated.                       - Use Analpram HC Cream 2.5%: Apply externally TID for                        10 days. Procedure Code(s):    --- Professional ---                       [id number], Colonoscopy, flexible; with removal of tumor(s),                        polyp(s), or other lesion(s) by snare technique Diagnosis Code(s):    --- Professional ---                       D12.3, Benign neoplasm of transverse colon (hepatic                        flexure or splenic flexure)                       D12.4, Benign neoplasm of descending colon  K64.0, First degree hemorrhoids                       Z83.71, Family history of colonic polyps                       K57.30, Diverticulosis of large intestine without                        perforation or abscess without bleeding CPT copyright 2018 American Medical Association. All rights reserved. The codes documented in this report are preliminary and upon coder review may  be revised to meet  current compliance requirements. Lollie Sails, MD 04/01/2018 8:55:15 AM This report has been signed electronically. Number of Addenda: 0 Note Initiated On: 04/01/2018 7:07 AM Scope Withdrawal Time: 0 hours 17 minutes 2 seconds  Total Procedure Duration: 0 hours 25 minutes 38 seconds       [facility]

## 2018-04-01 NOTE — Telephone Encounter (Signed)
Dr. Erlene Quan refilled this I do not think it's unreasonable for me to ask that a urologist refill his urology-related medicine

## 2018-04-01 NOTE — Telephone Encounter (Signed)
Pt would like to start getting refills through Dr Erlene Quan.  He needs an RX for Tamsulosin sent to [facility] in [place].  He has f/u w/Brandon in 4/20.  Please call pt to let him know if Erlene Quan will do this.  [phone]

## 2018-04-01 NOTE — Anesthesia Procedure Notes (Signed)
Date/Time: 04/01/2018 7:58 AM Performed by: Nelda Marseille, CRNA Pre-anesthesia Checklist: Patient identified, Emergency Drugs available, Suction available, Patient being monitored and Timeout performed Oxygen Delivery Method: Nasal cannula

## 2018-04-01 NOTE — Anesthesia Preprocedure Evaluation (Addendum)
Anesthesia Evaluation  Patient identified by MRN, date of birth, ID band Patient awake    Reviewed: Allergy & Precautions, H&P , NPO status , Patient's Chart, lab work & pertinent test results  Airway Mallampati: III       Dental  (+) Chipped   Pulmonary sleep apnea and Continuous Positive Airway Pressure Ventilation ,           Cardiovascular hypertension, negative cardio ROS    "Heart attack" and "heart failure" diagnoses listed in patient history, however pt denies these and there is no mention of this in recent cardiology note.  Very good exercise tolerance   Neuro/Psych PSYCHIATRIC DISORDERS Depression negative neurological ROS     GI/Hepatic negative GI ROS, Neg liver ROS, GERD  Controlled,  Endo/Other  negative endocrine ROS  Renal/GU negative Renal ROS  negative genitourinary   Musculoskeletal   Abdominal   Peds  Hematology negative hematology ROS (+)   Anesthesia Other Findings Past Medical History: No date: Depression No date: GERD (gastroesophageal reflux disease) No date: Heart attack ([place]) No date: Heart failure ([place]) No date: Hypertension No date: Sleep apnea  Past Surgical History: 1980: HEMORROIDECTOMY No date: VASECTOMY  BMI    Body Mass Index:  25.25 kg/m      Reproductive/Obstetrics negative OB ROS                            Anesthesia Physical Anesthesia Plan  ASA: II  Anesthesia Plan: General   Post-op Pain Management:    Induction:   PONV Risk Score and Plan: Propofol infusion and TIVA  Airway Management Planned: Natural Airway and Nasal Cannula  Additional Equipment:   Intra-op Plan:   Post-operative Plan:   Informed Consent: I have reviewed the patients History and Physical, chart, labs and discussed the procedure including the risks, benefits and alternatives for the proposed anesthesia with the patient or authorized representative who has  indicated his/her understanding and acceptance.   Dental Advisory Given  Plan Discussed with: Anesthesiologist, CRNA and Surgeon  Anesthesia Plan Comments:        Anesthesia Quick Evaluation

## 2018-04-01 NOTE — Transfer of Care (Signed)
Immediate Anesthesia Transfer of Care Note  Patient: [name] [name]  Procedure(s) Performed: ESOPHAGOGASTRODUODENOSCOPY (EGD) (N/A ) COLONOSCOPY WITH PROPOFOL (N/A )  Patient Location: PACU  Anesthesia Type:General  Level of Consciousness: sedated  Airway & Oxygen Therapy: Patient Spontanous Breathing and Patient connected to nasal cannula oxygen  Post-op Assessment: Report given to RN  Post vital signs: Reviewed and stable  Last Vitals:  Vitals Value Taken Time  BP    Temp    Pulse    Resp    SpO2      Last Pain:  Vitals:   04/01/18 0718  TempSrc: Tympanic  PainSc: 0-No pain         Complications: No apparent anesthesia complications

## 2018-04-01 NOTE — Anesthesia Post-op Follow-up Note (Signed)
Anesthesia QCDR form completed.        

## 2018-04-01 NOTE — H&P (Signed)
Outpatient short stay form Pre-procedure 04/01/2018 7:36 AM [name] Sails MD  Primary Physician: Dr. Enid Derry  Reason for visit: EGD and colonoscopy  History of present illness: Patient is a 68 year old male presenting today for EGD and colonoscopy.  Is a personal history of Barrett's esophagus.  There is also a family history of colon polyps in a primary relative.  He tolerated his prep well.  He takes no aspirin or blood thinning agent.  There is no dysphagia or heartburn although patient did discontinue his proton pump inhibitor for a while before he was encouraged by his primary care doctor to reinstitute.    Current Facility-Administered Medications:  .  0.9 %  sodium chloride infusion, , Intravenous, Continuous, [name] Sails, MD  Medications Prior to Admission  Medication Sig Dispense Refill Last Dose  . amLODipine (NORVASC) 5 MG tablet Take 5 mg by mouth daily.     Marland Kitchen b complex vitamins capsule Take 1 capsule by mouth daily.     . vitamin C (ASCORBIC ACID) 500 MG tablet Take 500 mg by mouth daily.     Marland Kitchen atorvastatin (LIPITOR) 20 MG tablet Take 1 tablet (20 mg total) by mouth every other day. 45 tablet 1 Taking  . CIALIS 10 MG tablet Take 5 mg by mouth daily as needed.   0 Taking  . CITRUS BERGAMOT PO Take 1 tablet by mouth daily.   Taking  . Garlic Oil 1740 MG CAPS Take 1 capsule by mouth daily.   Taking  . Hawthorn Berries 565 MG CAPS Take 1 capsule by mouth daily.   Taking  . hydrocortisone 2.5 % cream Apply 1 application topically 2 (two) times daily as needed. 30 g 1 Taking  . omeprazole (PRILOSEC) 20 MG capsule Take 20 mg by mouth daily.   Taking  . tamsulosin (FLOMAX) 0.4 MG CAPS capsule Take 0.4 mg by mouth daily.   4 Taking  . triamcinolone cream (KENALOG) 0.1 % Apply 1 application topically daily. If needed; too strong for face, groin, under arms 30 g 1 Taking     No Known Allergies   Past Medical History:  Diagnosis Date  . Depression   . GERD  (gastroesophageal reflux disease)   . Heart attack ([place])   . Heart failure ([place])   . Hypertension   . Sleep apnea     Review of systems:      Physical Exam    Heart and lungs: Regular rate and rhythm without rub or gallop, lungs are bilaterally clear.    HEENT: Normocephalic atraumatic eyes are anicteric    Other:    Pertinant exam for procedure: Soft nontender nondistended bowel sounds positive normoactive    Planned proceedures: EGD, colonoscopy and indicated procedures. I have discussed the risks benefits and complications of procedures to include not limited to bleeding, infection, perforation and the risk of sedation and the patient wishes to proceed.    [name] Sails, MD Gastroenterology 04/01/2018  7:36 AM

## 2018-04-01 NOTE — Op Note (Addendum)
[facility] Gastroenterology Patient Name: [name] [name] Procedure Date: 04/01/2018 7:11 AM MRN: [id number] Account #: [id number] Date of Birth: [birth year] Admit Type: Outpatient Age: 68 Room: [facility] ENDO ROOM 1 Gender: Male Note Status: Finalized Procedure:            Upper GI endoscopy Indications:          Follow-up of Barrett's esophagus Providers:            Lollie Sails, MD Referring MD:         Arnetha Courser (Referring MD) Medicines:            Monitored Anesthesia Care Complications:        No immediate complications. Procedure:            Pre-Anesthesia Assessment:                       - ASA Grade Assessment: III - A patient with severe                        systemic disease.                       After obtaining informed consent, the endoscope was                        passed under direct vision. Throughout the procedure,                        the patient's blood pressure, pulse, and oxygen                        saturations were monitored continuously. The Endoscope                        was introduced through the mouth, and advanced to the                        fourth part of duodenum. The upper GI endoscopy was                        accomplished without difficulty. The patient tolerated                        the procedure well. Findings:      There were esophageal mucosal changes secondary to established       long-segment Barrett's disease present in the lower third of the       esophagus. The maximum longitudinal extent of these mucosal changes was       6 cm in length. Mucosa was biopsied with a cold forceps for histology in       a targeted manner and in 4 quadrants at intervals of 2 cm at 33, 35, 37       and 39 cm from the incisors. A total of 4 specimen bottles were sent to       pathology.      A small hiatal hernia was found. The Z-line was a variable distance from       incisors; the hiatal hernia was sliding.      The exam of  the esophagus was otherwise normal.      Diffuse mild inflammation  characterized by congestion (edema) and       erythema was found in the gastric body. Biopsies were taken with a cold       forceps for histology.      The cardia and gastric fundus were normal on retroflexion otherwise.      The examined duodenum was normal. Biopsies were taken with a cold       forceps for histology. Impression:           - Esophageal mucosal changes secondary to established                        long-segment Barrett's disease. Biopsied.                       - Small hiatal hernia.                       - Bile gastritis. Biopsied.                       - Normal examined duodenum. Biopsied. Recommendation:       - Use Aciphex (rabeprazole) 20 mg PO daily daily.                       - Return to GI clinic in 5 weeks. Procedure Code(s):    --- Professional ---                       [phone], Esophagogastroduodenoscopy, flexible, transoral;                        with biopsy, single or multiple Diagnosis Code(s):    --- Professional ---                       K22.70, Barrett's esophagus without dysplasia                       K44.9, Diaphragmatic hernia without obstruction or                        gangrene                       K29.60, Other gastritis without bleeding CPT copyright 2018 American Medical Association. All rights reserved. The codes documented in this report are preliminary and upon coder review may  be revised to meet current compliance requirements. Lollie Sails, MD 04/01/2018 8:21:17 AM This report has been signed electronically. Number of Addenda: 0 Note Initiated On: 04/01/2018 7:11 AM      [facility]

## 2018-04-02 ENCOUNTER — Encounter: Payer: Self-pay | Admitting: Gastroenterology

## 2018-04-02 LAB — SURGICAL PATHOLOGY

## 2018-05-03 ENCOUNTER — Encounter: Payer: Self-pay | Admitting: Family Medicine

## 2018-06-30 ENCOUNTER — Other Ambulatory Visit: Payer: PPO

## 2018-07-07 ENCOUNTER — Ambulatory Visit: Payer: PPO | Admitting: Urology

## 2018-07-28 ENCOUNTER — Other Ambulatory Visit: Payer: Self-pay | Admitting: Urology

## 2018-08-24 ENCOUNTER — Ambulatory Visit (INDEPENDENT_AMBULATORY_CARE_PROVIDER_SITE_OTHER): Payer: PPO | Admitting: Nurse Practitioner

## 2018-08-24 ENCOUNTER — Other Ambulatory Visit: Payer: Self-pay

## 2018-08-24 ENCOUNTER — Encounter: Payer: Self-pay | Admitting: Nurse Practitioner

## 2018-08-24 ENCOUNTER — Ambulatory Visit: Payer: PPO | Admitting: Family Medicine

## 2018-08-24 VITALS — BP 126/78 | HR 60 | Temp 98.0°F | Resp 12 | Ht 70.0 in | Wt 177.6 lb

## 2018-08-24 DIAGNOSIS — I10 Essential (primary) hypertension: Secondary | ICD-10-CM

## 2018-08-24 DIAGNOSIS — E7800 Pure hypercholesterolemia, unspecified: Secondary | ICD-10-CM

## 2018-08-24 DIAGNOSIS — N529 Male erectile dysfunction, unspecified: Secondary | ICD-10-CM

## 2018-08-24 DIAGNOSIS — Z5181 Encounter for therapeutic drug level monitoring: Secondary | ICD-10-CM | POA: Diagnosis not present

## 2018-08-24 DIAGNOSIS — N400 Enlarged prostate without lower urinary tract symptoms: Secondary | ICD-10-CM

## 2018-08-24 DIAGNOSIS — K22719 Barrett's esophagus with dysplasia, unspecified: Secondary | ICD-10-CM

## 2018-08-24 DIAGNOSIS — G4733 Obstructive sleep apnea (adult) (pediatric): Secondary | ICD-10-CM | POA: Diagnosis not present

## 2018-08-24 LAB — LIPID PANEL
Cholesterol: 155 mg/dL (ref <200)
HDL: 50 mg/dL (ref >=40)
LDL Cholesterol (Calc): 88 mg/dL (calc)
Non-HDL Cholesterol (Calc): 105 mg/dL (calc) (ref <130)
Total CHOL/HDL Ratio: 3.1 (calc) (ref <5.0)
Triglycerides: 79 mg/dL (ref <150)

## 2018-08-24 LAB — COMPLETE METABOLIC PANEL WITH GFR
AG Ratio: 1.6 (calc) (ref 1.0–2.5)
ALT: 18 U/L (ref 9–46)
AST: 20 U/L (ref 10–35)
Albumin: 4.4 g/dL (ref 3.6–5.1)
Alkaline phosphatase (APISO): 82 U/L (ref 35–144)
BUN: 20 mg/dL (ref 7–25)
CO2: 27 mmol/L (ref 20–32)
Calcium: 9.6 mg/dL (ref 8.6–10.3)
Chloride: 105 mmol/L (ref 98–110)
Creat: 0.94 mg/dL (ref 0.70–1.25)
GFR, Est African American: 97 mL/min/{1.73_m2} (ref >=60)
GFR, Est Non African American: 84 mL/min/{1.73_m2} (ref >=60)
Globulin: 2.7 g/dL (calc) (ref 1.9–3.7)
Glucose, Bld: 91 mg/dL (ref 65–99)
Potassium: 4.7 mmol/L (ref 3.5–5.3)
Sodium: 139 mmol/L (ref 135–146)
Total Bilirubin: 0.9 mg/dL (ref 0.2–1.2)
Total Protein: 7.1 g/dL (ref 6.1–8.1)

## 2018-08-24 MED ORDER — ATORVASTATIN CALCIUM 20 MG PO TABS: 20.0000 mg | ORAL_TABLET | ORAL | 1 refills | Status: DC

## 2018-08-24 MED ORDER — AMLODIPINE BESYLATE 5 MG PO TABS: 5.0000 mg | ORAL_TABLET | Freq: Every day | ORAL | 1 refills | Status: DC

## 2018-08-24 NOTE — Progress Notes (Signed)
Name: [name] [name]   MRN: [id number]    DOB: 05-27-1950   Date:08/24/2018       Progress Note  Subjective  Chief Complaint  Chief Complaint  Patient presents with  . Follow-up    HPI  Hypertension Patient is on amlodipine 5mg .  Takes medications as prescribed with no missed doses a month.  Is compliant with low-salt diet.  checks blood pressures at home with range of 120-130's/70's Denies chest pain, headaches, blurry vision. Has erectile dysfunction- cialis daily  BP Readings from Last 3 Encounters:  08/24/18 126/78  04/01/18 119/70  02/24/18 (!) 170/100    Hyperlipidemia Patient was taking atorvastatin 20mg  daily then 6 months ago switched to every other day due to an issue that he doesn't recall. No issues with those dosage.  Lab Results  Component Value Date   CHOL 141 01/28/2018   HDL 49 01/28/2018   LDLCALC 76 01/28/2018   TRIG 82 01/28/2018   CHOLHDL 2.9 01/28/2018    Barretts Esophagus  patient is taking rabeprazole, sees Dr. Gustavo Lah- GI see him routinely.   BPH Takes flomax daily, see Dr. Erlene Quan.   OSA States stopped wearing CPAP- states did not help him any and made him feel worse- was unable to   PHQ2/9: Depression screen [facility] 2/9 08/24/2018 02/22/2018 01/19/2018 01/05/2018 12/21/2017  Decreased Interest 0 0 0 0 0  Down, Depressed, Hopeless 0 0 0 0 0  PHQ - 2 Score 0 0 0 0 0  Altered sleeping 0 0 - 3 3  Tired, decreased energy 0 0 - 3 0  Change in appetite 0 0 - 0 0  Feeling bad or failure about yourself  0 0 - 0 0  Trouble concentrating 0 0 - 0 0  Moving slowly or fidgety/restless 0 0 - 0 0  Suicidal thoughts 0 0 - 0 0  PHQ-9 Score 0 0 - 6 3  Difficult doing work/chores Not difficult at all Not difficult at all - Not difficult at all Not difficult at all     PHQ reviewed. Negative  Patient Active Problem List   Diagnosis Date Noted  . Elevated PSA 12/21/2017  . High cholesterol 12/21/2017  . Barrett's esophagus with dysplasia 12/21/2017   . Medication monitoring encounter 12/21/2017  . Syncope 05/20/2017  . Abnormal EKG 05/20/2017  . Obstructive sleep apnea 05/20/2017  . Essential hypertension 05/20/2017    Past Medical History:  Diagnosis Date  . Depression   . GERD (gastroesophageal reflux disease)   . Heart attack ([place])   . Heart failure ([place])   . Hypertension   . Sleep apnea     Past Surgical History:  Procedure Laterality Date  . COLONOSCOPY WITH PROPOFOL N/A 04/01/2018   Procedure: COLONOSCOPY WITH PROPOFOL;  Surgeon: Lollie Sails, MD;  Location: [facility] ENDOSCOPY;  Service: Endoscopy;  Laterality: N/A;  . ESOPHAGOGASTRODUODENOSCOPY N/A 04/01/2018   Procedure: ESOPHAGOGASTRODUODENOSCOPY (EGD);  Surgeon: Lollie Sails, MD;  Location: [facility] ENDOSCOPY;  Service: Endoscopy;  Laterality: N/A;  . [place]  . VASECTOMY      Social History   Tobacco Use  . Smoking status: Never Smoker  . Smokeless tobacco: Never Used  Substance Use Topics  . Alcohol use: Yes    Alcohol/week: 0.0 standard drinks     Current Outpatient Medications:  .  atorvastatin (LIPITOR) 20 MG tablet, Take 1 tablet (20 mg total) by mouth every other day., Disp: 45 tablet, Rfl: 1 .  b complex vitamins capsule,  Take 1 capsule by mouth daily., Disp: , Rfl:  .  CITRUS BERGAMOT PO, Take 1 tablet by mouth daily., Disp: , Rfl:  .  Garlic Oil 2979 MG CAPS, Take 1 capsule by mouth daily., Disp: , Rfl:  .  Hawthorn Berries 565 MG CAPS, Take 1 capsule by mouth daily., Disp: , Rfl:  .  hydrocortisone 2.5 % cream, Apply 1 application topically 2 (two) times daily as needed., Disp: 30 g, Rfl: 1 .  RABEprazole (ACIPHEX) 20 MG tablet, TAKE 1 TABLET BY MOUTH ONCE DAILY, Disp: , Rfl:  .  tamsulosin (FLOMAX) 0.4 MG CAPS capsule, TAKE 1 CAPSULE BY MOUTH ONCE DAILY, Disp: 30 capsule, Rfl: 3 .  triamcinolone cream (KENALOG) 0.1 %, Apply 1 application topically daily. If needed; too strong for face, groin, under arms, Disp: 30 g, Rfl: 1 .   vitamin C (ASCORBIC ACID) 500 MG tablet, Take 500 mg by mouth daily., Disp: , Rfl:  .  amLODipine (NORVASC) 5 MG tablet, Take 5 mg by mouth daily., Disp: , Rfl:  .  CIALIS 10 MG tablet, Take 5 mg by mouth daily as needed. , Disp: , Rfl: 0 .  omeprazole (PRILOSEC) 20 MG capsule, Take 20 mg by mouth daily., Disp: , Rfl:   No Known Allergies  Review of Systems  Constitutional: Negative for chills, fever and malaise/fatigue.  HENT: Negative for congestion, sinus pain and sore throat.   Eyes: Negative for blurred vision.  Respiratory: Negative for cough and shortness of breath.   Cardiovascular: Negative for chest pain, palpitations and leg swelling.  Gastrointestinal: Negative for abdominal pain, constipation, diarrhea and nausea.  Genitourinary: Negative for dysuria.  Musculoskeletal: Negative for falls and joint pain.  Skin: Negative for rash.  Neurological: Negative for dizziness and headaches.  Endo/Heme/Allergies: Negative for polydipsia.  Psychiatric/Behavioral: The patient is not nervous/anxious and does not have insomnia.       No other specific complaints in a complete review of systems (except as listed in HPI above).  Objective  Vitals:   08/24/18 0927  BP: 126/78  Pulse: 60  Resp: 12  Temp: 98 F (36.7 C)  TempSrc: Oral  SpO2: 99%  Weight: 177 lb 9.6 oz (80.6 kg)  Height: 5\' 10"  (1.778 m)    Body mass index is 25.48 kg/m.  Nursing Note and Vital Signs reviewed.  Physical Exam Vitals signs reviewed.  Constitutional:      Appearance: He is well-developed.  HENT:     Head: Normocephalic and atraumatic.  Neck:     Musculoskeletal: Normal range of motion and neck supple.     Vascular: No carotid bruit.  Cardiovascular:     Heart sounds: Normal heart sounds.  Pulmonary:     Effort: Pulmonary effort is normal.     Breath sounds: Normal breath sounds.  Abdominal:     General: Bowel sounds are normal.     Palpations: Abdomen is soft.     Tenderness: There  is no abdominal tenderness.  Musculoskeletal: Normal range of motion.  Skin:    General: Skin is warm and dry.     Capillary Refill: Capillary refill takes less than 2 seconds.  Neurological:     Mental Status: He is alert and oriented to person, place, and time.     GCS: GCS eye subscore is 4. GCS verbal subscore is 5. GCS motor subscore is 6.     Sensory: No sensory deficit.  Psychiatric:        Speech: Speech  normal.        Behavior: Behavior normal.        Thought Content: Thought content normal.        Judgment: Judgment normal.        No results found for this or any previous visit (from the past 48 hour(s)).  Assessment & Plan  1. Essential hypertension Stable  - amLODipine (NORVASC) 5 MG tablet; Take 1 tablet (5 mg total) by mouth daily.  Dispense: 90 tablet; Refill: 1 - COMPLETE METABOLIC PANEL WITH GFR  2. Obstructive sleep apnea Consider re-assessment   3. Barrett's esophagus with dysplasia Follow up with GI, continue PPI  4. High cholesterol recheck - atorvastatin (LIPITOR) 20 MG tablet; Take 1 tablet (20 mg total) by mouth every other day.  Dispense: 45 tablet; Refill: 1 - Lipid Profile  5. Benign prostatic hyperplasia without lower urinary tract symptoms Improved with meds   6. Erectile dysfunction, unspecified erectile dysfunction type stable  7. Medication monitoring encounter - COMPLETE METABOLIC PANEL WITH GFR   -Red flags and when to present for emergency care or RTC including fever >101.94F, chest pain, shortness of breath, new/worsening/un-resolving symptoms,  reviewed with patient at time of visit. Follow up and care instructions discussed and provided in AVS.

## 2018-08-24 NOTE — Patient Instructions (Signed)
If you have not heard anything from my staff in a week about any orders/referrals/studies from today, please contact us here to follow-up [phone]  

## 2018-09-17 ENCOUNTER — Other Ambulatory Visit: Payer: Self-pay | Admitting: Family Medicine

## 2018-09-17 DIAGNOSIS — R972 Elevated prostate specific antigen [PSA]: Secondary | ICD-10-CM

## 2018-09-20 ENCOUNTER — Other Ambulatory Visit: Payer: Self-pay

## 2018-09-20 ENCOUNTER — Other Ambulatory Visit: Payer: PPO

## 2018-09-20 DIAGNOSIS — R972 Elevated prostate specific antigen [PSA]: Secondary | ICD-10-CM | POA: Diagnosis not present

## 2018-09-21 LAB — PSA: Prostate Specific Ag, Serum: 3.5 ng/mL (ref 0.0–4.0)

## 2018-09-28 ENCOUNTER — Other Ambulatory Visit: Payer: Self-pay

## 2018-09-28 ENCOUNTER — Encounter: Payer: Self-pay | Admitting: Urology

## 2018-09-28 ENCOUNTER — Ambulatory Visit (INDEPENDENT_AMBULATORY_CARE_PROVIDER_SITE_OTHER): Payer: PPO | Admitting: Urology

## 2018-09-28 VITALS — BP 150/82 | HR 59 | Ht 70.0 in | Wt 180.0 lb

## 2018-09-28 DIAGNOSIS — N138 Other obstructive and reflux uropathy: Secondary | ICD-10-CM

## 2018-09-28 DIAGNOSIS — R972 Elevated prostate specific antigen [PSA]: Secondary | ICD-10-CM | POA: Diagnosis not present

## 2018-09-28 DIAGNOSIS — N401 Enlarged prostate with lower urinary tract symptoms: Secondary | ICD-10-CM | POA: Diagnosis not present

## 2018-09-28 MED ORDER — TAMSULOSIN HCL 0.4 MG PO CAPS: 0.4000 mg | ORAL_CAPSULE | Freq: Every day | ORAL | 3 refills | Status: DC

## 2018-09-28 NOTE — Progress Notes (Signed)
09/28/2018 10:40 AM   [name] [name] [birth year] [id number]  Referring provider: Karen Kitchens, MD [address] [place],  [place] [address]  Chief Complaint  Patient presents with  . Elevated PSA    Follow up    HPI: 68 year old male with a personal history of elevated PSA and BPH returns today for 90-month follow-up.  Since last visit, he had a repeat PSA which is now stabilized to 3.5.  PSA trend as below.  He is also due for rectal exam today.  In terms of urination, he is been doing well on Flomax.  He reports that he gets up at 3 AM to void once which is habitual and unchanged.  His flow is improved.  He takes his Flomax in the morning which seems to be more helpful and he denies any dizziness or fatigue by taking this medication in the this manner.  He does think that it is improved his urination overall.  No weak stream.  No dysuria or gross hematuria.  He is satisfied with his voiding.   PSA 2.5. (2.22/2018)--> 3.9 (05/12/2017) --> 4.2 (06/16/17) --> 3.8 (09/29/17) --> 3.5 (09/20/2018)  PMH: Past Medical History:  Diagnosis Date  . Depression   . GERD (gastroesophageal reflux disease)   . Heart attack ([place])   . Heart failure ([place])   . Hypertension   . Sleep apnea     Surgical History: Past Surgical History:  Procedure Laterality Date  . COLONOSCOPY WITH PROPOFOL N/A 04/01/2018   Procedure: COLONOSCOPY WITH PROPOFOL;  Surgeon: Lollie Sails, MD;  Location: [facility] ENDOSCOPY;  Service: Endoscopy;  Laterality: N/A;  . ESOPHAGOGASTRODUODENOSCOPY N/A 04/01/2018   Procedure: ESOPHAGOGASTRODUODENOSCOPY (EGD);  Surgeon: Lollie Sails, MD;  Location: [facility] ENDOSCOPY;  Service: Endoscopy;  Laterality: N/A;  . [place]  . VASECTOMY      Home Medications:  Allergies as of 09/28/2018      Reactions   Omeprazole Diarrhea      Medication List       Accurate as of September 28, 2018 10:40 AM. If you have any questions, ask your nurse or doctor.        STOP  taking these medications   Cialis 10 MG tablet Generic drug: tadalafil Stopped by: Hollice Espy, MD     TAKE these medications   amLODipine 5 MG tablet Commonly known as: NORVASC Take 1 tablet (5 mg total) by mouth daily.   atorvastatin 20 MG tablet Commonly known as: LIPITOR Take 1 tablet (20 mg total) by mouth every other day.   b complex vitamins capsule Take 1 capsule by mouth daily.   CITRUS BERGAMOT PO Take 1 tablet by mouth daily.   Garlic Oil 6295 MG Caps Take 1 capsule by mouth daily.   Hawthorn Berries 565 MG Caps Take 1 capsule by mouth daily.   hydrocortisone 2.5 % cream Apply 1 application topically 2 (two) times daily as needed.   RABEprazole 20 MG tablet Commonly known as: ACIPHEX TAKE 1 TABLET BY MOUTH ONCE DAILY   tamsulosin 0.4 MG Caps capsule Commonly known as: FLOMAX TAKE 1 CAPSULE BY MOUTH ONCE DAILY   triamcinolone cream 0.1 % Commonly known as: KENALOG Apply 1 application topically daily. If needed; too strong for face, groin, under arms   vitamin C 500 MG tablet Commonly known as: ASCORBIC ACID Take 500 mg by mouth daily.       Allergies:  Allergies  Allergen Reactions  . Omeprazole Diarrhea  Family History: Family History  Problem Relation Age of Onset  . Stroke Father   . Heart attack Father   . Prostate cancer Father   . Heart disease Father   . Dementia Mother   . Heart attack Paternal Grandmother     Social History:  reports that he has never smoked. He has never used smokeless tobacco. He reports current alcohol use. He reports that he does not use drugs.  ROS: UROLOGY Frequent Urination?: No Hard to postpone urination?: No Burning/pain with urination?: No Get up at night to urinate?: No Leakage of urine?: No Urine stream starts and stops?: No Trouble starting stream?: No Do you have to strain to urinate?: No Blood in urine?: No Urinary tract infection?: No Sexually transmitted disease?: No Injury to  kidneys or bladder?: No Painful intercourse?: No Weak stream?: No Erection problems?: No Penile pain?: No  Gastrointestinal Nausea?: No Vomiting?: No Indigestion/heartburn?: No Diarrhea?: No Constipation?: No  Constitutional Fever: No Night sweats?: No Weight loss?: No Fatigue?: No  Skin Skin rash/lesions?: No Itching?: No  Eyes Blurred vision?: No Double vision?: No  Ears/Nose/Throat Sore throat?: No Sinus problems?: No  Hematologic/Lymphatic Swollen glands?: No Easy bruising?: No  Cardiovascular Leg swelling?: No Chest pain?: No  Respiratory Cough?: No Shortness of breath?: No  Endocrine Excessive thirst?: No  Musculoskeletal Back pain?: No Joint pain?: No  Neurological Headaches?: No Dizziness?: No  Psychologic Depression?: No Anxiety?: No  Physical Exam: BP (!) 150/82   Pulse (!) 59   Ht 5\' 10"  (1.778 m)   Wt 180 lb (81.6 kg)   BMI 25.83 kg/m   Constitutional:  Alert and oriented, No acute distress. HEENT: [place] AT, moist mucus membranes.  Trachea midline, no masses. Cardiovascular: No clubbing, cyanosis, or edema. Respiratory: Normal respiratory effort, no increased work of breathing. GI: Abdomen is soft, nontender, nondistended, no abdominal masses GU: No CVA tenderness Rectal: Normal sphincter tone.  Slightly enlarged prostate, nontender, no nodules. Skin: No rashes, bruises or suspicious lesions. Neurologic: Grossly intact, no focal deficits, moving all 4 extremities. Psychiatric: Normal mood and affect.  Laboratory Data: Lab Results  Component Value Date   WBC 5.4 12/21/2017   HGB 16.4 12/21/2017   HCT 46.0 12/21/2017   MCV 91.3 12/21/2017   PLT 241 12/21/2017    Lab Results  Component Value Date   CREATININE 0.94 08/24/2018    Assessment & Plan:    1. Elevated PSA PSA has stabilized and has trended back down towards his baseline Rectal exam today is unremarkable I recommended continued annual screening at least  until age 44 Given his PSA is stabilized, I advised him that he may follow-up with his PCP annually for this referral back as needed He is agreeable this plan  2. BPH with obstruction/lower urinary tract symptoms Symptoms stable on Flomax, content with this medication Refilled for the next year He may follow-up as needed if his symptoms worsen or he like to discuss alternatives   Hollice Espy, MD  [place] [address] [place], [place] [address] [phone]

## 2018-10-13 DIAGNOSIS — H02886 Meibomian gland dysfunction of left eye, unspecified eyelid: Secondary | ICD-10-CM | POA: Diagnosis not present

## 2018-10-13 DIAGNOSIS — H02889 Meibomian gland dysfunction of unspecified eye, unspecified eyelid: Secondary | ICD-10-CM | POA: Diagnosis not present

## 2018-10-13 DIAGNOSIS — H31003 Unspecified chorioretinal scars, bilateral: Secondary | ICD-10-CM | POA: Diagnosis not present

## 2018-10-13 DIAGNOSIS — H2513 Age-related nuclear cataract, bilateral: Secondary | ICD-10-CM | POA: Diagnosis not present

## 2018-12-16 ENCOUNTER — Other Ambulatory Visit: Payer: Self-pay | Admitting: Family Medicine

## 2018-12-16 NOTE — Telephone Encounter (Signed)
Requested medication (s) are due for refill today: yes  Requested medication (s) are on the active medication list: yes  Last refill:  01/08/2018  Future visit scheduled:yes  Notes to clinic:  Review for refill  Requested Prescriptions  Pending Prescriptions Disp Refills   triamcinolone cream (KENALOG) 0.1 % [Pharmacy Med Name: TRIAMCINOLONE ACETON 0.1% TOP CREAM] 15 g     Sig: APPLY 1 APPLICATION TOPICALLY DAILY IF NEEDED. TO STRONG FOR FACE, GROIN OR UNDER ARMS     Dermatology:  Corticosteroids Passed - 12/16/2018 11:04 AM      Passed - Valid encounter within last 12 months    Recent Outpatient Visits          3 months ago Essential hypertension   [place], NP   9 months ago Decreased exercise tolerance   [place], MD   11 months ago Essential hypertension   [place], NP   11 months ago High cholesterol   [place], Satira Anis, MD   12 months ago Essential hypertension   [place], Satira Anis, MD      Future Appointments            In 1 week McLean-Scocuzza, Nino Glow, MD [place], [place]   In 2 months  [facility], [place]   In 9 months Hollice Espy, [place]

## 2018-12-29 ENCOUNTER — Encounter: Payer: Self-pay | Admitting: Internal Medicine

## 2018-12-29 ENCOUNTER — Ambulatory Visit (INDEPENDENT_AMBULATORY_CARE_PROVIDER_SITE_OTHER): Payer: PPO | Admitting: Internal Medicine

## 2018-12-29 ENCOUNTER — Other Ambulatory Visit: Payer: Self-pay

## 2018-12-29 VITALS — Ht 72.0 in | Wt 180.0 lb

## 2018-12-29 DIAGNOSIS — R195 Other fecal abnormalities: Secondary | ICD-10-CM

## 2018-12-29 DIAGNOSIS — K30 Functional dyspepsia: Secondary | ICD-10-CM

## 2018-12-29 DIAGNOSIS — Z1389 Encounter for screening for other disorder: Secondary | ICD-10-CM

## 2018-12-29 DIAGNOSIS — N401 Enlarged prostate with lower urinary tract symptoms: Secondary | ICD-10-CM

## 2018-12-29 DIAGNOSIS — L304 Erythema intertrigo: Secondary | ICD-10-CM

## 2018-12-29 DIAGNOSIS — E785 Hyperlipidemia, unspecified: Secondary | ICD-10-CM

## 2018-12-29 DIAGNOSIS — Z1159 Encounter for screening for other viral diseases: Secondary | ICD-10-CM | POA: Diagnosis not present

## 2018-12-29 DIAGNOSIS — K635 Polyp of colon: Secondary | ICD-10-CM

## 2018-12-29 DIAGNOSIS — R972 Elevated prostate specific antigen [PSA]: Secondary | ICD-10-CM

## 2018-12-29 DIAGNOSIS — K22719 Barrett's esophagus with dysplasia, unspecified: Secondary | ICD-10-CM

## 2018-12-29 DIAGNOSIS — Z13818 Encounter for screening for other digestive system disorders: Secondary | ICD-10-CM | POA: Diagnosis not present

## 2018-12-29 DIAGNOSIS — R351 Nocturia: Secondary | ICD-10-CM

## 2018-12-29 DIAGNOSIS — Z1329 Encounter for screening for other suspected endocrine disorder: Secondary | ICD-10-CM | POA: Diagnosis not present

## 2018-12-29 DIAGNOSIS — K9041 Non-celiac gluten sensitivity: Secondary | ICD-10-CM | POA: Diagnosis not present

## 2018-12-29 DIAGNOSIS — I10 Essential (primary) hypertension: Secondary | ICD-10-CM

## 2018-12-29 DIAGNOSIS — N400 Enlarged prostate without lower urinary tract symptoms: Secondary | ICD-10-CM | POA: Insufficient documentation

## 2018-12-29 DIAGNOSIS — E559 Vitamin D deficiency, unspecified: Secondary | ICD-10-CM | POA: Diagnosis not present

## 2018-12-29 MED ORDER — MICONAZOLE NITRATE 2 % EX CREA: 1.0000 "application " | TOPICAL_CREAM | Freq: Two times a day (BID) | CUTANEOUS | 12 refills | Status: DC

## 2018-12-29 MED ORDER — HYDROCORTISONE 2.5 % EX CREA: 1.0000 "application " | TOPICAL_CREAM | Freq: Two times a day (BID) | CUTANEOUS | 12 refills | Status: DC | PRN

## 2018-12-29 NOTE — Progress Notes (Signed)
Virtual Visit via Video Note  I connected with [name] [name]   on 12/29/18 at  3:45 PM EDT by a video enabled telemedicine application and verified that I am speaking with the correct person using two identifiers.  Location patient: home Location provider:work or home office Persons participating in the virtual visit: patient, provider  I discussed the limitations of evaluation and management by telemedicine and the availability of in person appointments. The patient expressed understanding and agreed to proceed.   HPI: 1. HTN BP elevated last visit 08/2018 150/82 he reports he is not taking norvasc 5 mg qd  2. hLd taking lipitor qod 20 mg qod  3. H/o elevated PSA with BPH f/u with Dr. Erlene Quan last see n6/2020 on flomax and tolerating though he reports at times wakling up to urinate 4-6 x qhs other times 1-2 x and medium stream 4. C/o gluten intolerance and loose stools and gas and wants to be checked for celiac    ROS: See pertinent positives and negatives per HPI. General: no wt loss  HEENT: no sore throat  CV: no chest pain  Lungs: no sob  GI: loose stools, gas MSK: no jt pain  Neuro: no h/a  Psych: +insomnia  Skin: no issues  GU: +BPH, +nocturia  Past Medical History:  Diagnosis Date  . 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 Depression   . GERD (gastroesophageal reflux disease)   . Heart attack ([place])   . Heart failure ([place])   . Hypertension   . Sleep apnea     Past Surgical History:  Procedure Laterality Date  . COLONOSCOPY WITH PROPOFOL N/A 04/01/2018   Procedure: COLONOSCOPY WITH PROPOFOL;  Surgeon: Lollie Sails, MD;  [facility] ENDOSCOPY;  Service: Endoscopy;  Laterality: N/A;  . ESOPHAGOGASTRODUODENOSCOPY N/A 04/01/2018   Procedure: ESOPHAGOGASTRODUODENOSCOPY (EGD);  Surgeon: Lollie Sails, MD;  Location: [facility] ENDOSCOPY;  Service: Endoscopy;  Laterality: N/A;  . [place]  .  VASECTOMY      Family History  Problem Relation Age of Onset  . Stroke Father   . Heart attack Father        h/o cig smoker   . Prostate cancer Father   . Heart disease Father   . Dementia Mother   . 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  atorvastatin (LIPITOR) 20 MG tablet, Take 1 tablet (20 mg total) by mouth every other day., Disp: 45 tablet, Rfl: 1 .  b complex vitamins capsule, Take 1 capsule by mouth daily., Disp: , Rfl:  .  CITRUS BERGAMOT PO, Take 1 tablet by mouth daily., Disp: , Rfl:  .  Garlic Oil 123XX123 MG CAPS, Take 1 capsule by mouth daily., Disp: , Rfl:  .  Hawthorn Berries 565 MG CAPS, Take 1 capsule by mouth daily., Disp: , Rfl:  .  hydrocortisone 2.5 % cream, Apply 1 application topically 2 (two) times daily as needed. Inner thighs, Disp: 60 g, Rfl: 12 .  RABEprazole (ACIPHEX) 20 MG tablet, TAKE 1 TABLET BY MOUTH ONCE DAILY, Disp: , Rfl:  .  tamsulosin (FLOMAX) 0.4 MG CAPS capsule, Take 1 capsule (0.4 mg total) by mouth daily., Disp: 90 capsule, Rfl: 3 .  vitamin C (  ASCORBIC ACID) 500 MG tablet, Take 500 mg by mouth daily., Disp: , Rfl:  .  amLODipine (NORVASC) 5 MG tablet, Take 1 tablet (5 mg total) by mouth daily. (Patient not taking: Reported on 12/29/2018), Disp: 90 tablet, Rfl: 1 .  miconazole (MICATIN) 2 % cream, Apply 1 application topically 2 (two) times daily. Thighs prn, Disp: 60 g, Rfl: 12  EXAM:  VITALS per patient if applicable:  GENERAL: alert, oriented, appears well and in no acute distress  HEENT: atraumatic, conjunttiva clear, no obvious abnormalities on inspection of external nose and ears  NECK: normal movements of the head and neck  LUNGS: on inspection no signs of respiratory distress, breathing rate appears normal, no obvious gross SOB, gasping or wheezing  CV: no obvious  cyanosis  MS: moves all visible extremities without noticeable abnormality  PSYCH/NEURO: pleasant and cooperative, no obvious depression or anxiety, speech and thought processing grossly intact  ASSESSMENT AND PLAN:  Discussed the following assessment and plan:  Essential hypertension - Plan: fasting labs [facility]  Will rec he take BP meds if BP elevated >130/>80 -bp check in 1-2 weeks   Intertrigo - Plan: hydrocortisone 2.5 % cream, miconazole (MICATIN) 2 % cream Stop TMC cream   Hyperlipidemia, unspecified hyperlipidemia type - Plan: Lipid panel On lipitor 20 mg qod    Benign prostatic hyperplasia with nocturia, elevated PSA - Plan: Urinalysis, Routine w reflex microscopic -f/u Dr. Erlene Quan sch 09/2019   Gluten intolerance/loose stools, gas - Plan: Gliadin antibodies, serum, Tissue transglutaminase, IgA, Reticulin Antibody, IgA w reflex titer  Barrett's esophagus with dysplasia -CC [facility] Dr. Gustavo Lah to see if needs to be on ppi prev did not tolerate omeprazole egd 03/2018 +barretts neg malignancy  HM rec flu shot high dose our clinic or pharmacy  utd prevnar  Disc and mailed info pna 23, Tdap and shingrix   PSA with h/o BPH had 3.5 09/20/18  -will need year f/u urology Dr. Erlene Quan last seen 09/27/28 cont flomax rec   EGD 04/01/18 + barretts will need f/u in 3 years -will [facility] Dr. Gustavo Lah to see if pt should be on PPI prev. Omeprazole caused diarrhea   Colonoscopy 04/01/18 serrated polyp negative pathology [facility]   Skin check at f/u to see if needs derm referral   [facility] in [place] saw 10/2018   At f/u confirm h/o MI/CHF in the past   -we discussed possible serious and likely etiologies, options for evaluation and workup, limitations of telemedicine visit vs in person visit, treatment, treatment risks and precautions. Pt prefers to treat via telemedicine empirically rather then risking or undertaking an in person visit at this moment. Patient agrees to seek prompt in  person care if worsening, new symptoms arise, or if is not improving with treatment.   I discussed the assessment and treatment plan with the patient. The patient was provided an opportunity to ask questions and all were answered. The patient agreed with the plan and demonstrated an understanding of the instructions.   The patient was advised to call back or seek an in-person evaluation if the symptoms worsen or if the condition fails to improve as anticipated.  Time spent 25 minutes  Delorise [place], MD

## 2018-12-29 NOTE — Patient Instructions (Addendum)
Recommend high dose flu shot in our clinic or your pharmacy  Pneumonia 23 vaccine in our clinic or your pharmacy   shingrix vaccine at your pharmacy 2 doses, 2nd dose between 2 to less than 6 months of the 1st   Tdap vaccine at your pharmacy   Please wait 1 month in between all vaccines   Recombinant Zoster (Shingles) Vaccine: What You Need to Know 1. Why get vaccinated? Recombinant zoster (shingles) vaccine can prevent shingles. Shingles (also called herpes zoster, or just zoster) is a painful skin rash, usually with blisters. In addition to the rash, shingles can cause fever, headache, chills, or upset stomach. More rarely, shingles can lead to pneumonia, hearing problems, blindness, brain inflammation (encephalitis), or death. The most common complication of shingles is long-term nerve pain called postherpetic neuralgia (PHN). PHN occurs in the areas where the shingles rash was, even after the rash clears up. It can last for months or years after the rash goes away. The pain from PHN can be severe and debilitating. About 10 to 18% of people who get shingles will experience PHN. The risk of PHN increases with age. An older adult with shingles is more likely to develop PHN and have longer lasting and more severe pain than a younger person with shingles. Shingles is caused by the varicella zoster virus, the same virus that causes chickenpox. After you have chickenpox, the virus stays in your body and can cause shingles later in life. Shingles cannot be passed from one person to another, but the virus that causes shingles can spread and cause chickenpox in someone who had never had chickenpox or received chickenpox vaccine. 2. Recombinant shingles vaccine Recombinant shingles vaccine provides strong protection against shingles. By preventing shingles, recombinant shingles vaccine also protects against PHN. Recombinant shingles vaccine is the preferred vaccine for the prevention of shingles. However,  a different vaccine, live shingles vaccine, may be used in some circumstances. The recombinant shingles vaccine is recommended for adults 50 years and older without serious immune problems. It is given as a two-dose series. This vaccine is also recommended for people who have already gotten another type of shingles vaccine, the live shingles vaccine. There is no live virus in this vaccine. Shingles vaccine may be given at the same time as other vaccines. 3. Talk with your health care provider Tell your vaccine provider if the person getting the vaccine:  Has had an allergic reaction after a previous dose of recombinant shingles vaccine, or has any severe, life-threatening allergies.  Is pregnant or breastfeeding.  Is currently experiencing an episode of shingles. In some cases, your health care provider may decide to postpone shingles vaccination to a future visit. People with minor illnesses, such as a cold, may be vaccinated. People who are moderately or severely ill should usually wait until they recover before getting recombinant shingles vaccine. Your health care provider can give you more information. 4. Risks of a vaccine reaction  A sore arm with mild or moderate pain is very common after recombinant shingles vaccine, affecting about 80% of vaccinated people. Redness and swelling can also happen at the site of the injection.  Tiredness, muscle pain, headache, shivering, fever, stomach pain, and nausea happen after vaccination in more than half of people who receive recombinant shingles vaccine. In clinical trials, about 1 out of 6 people who got recombinant zoster vaccine experienced side effects that prevented them from doing regular activities. Symptoms usually went away on their own in 2 to 3 days.  You should still get the second dose of recombinant zoster vaccine even if you had one of these reactions after the first dose. People sometimes faint after medical procedures, including  vaccination. Tell your provider if you feel dizzy or have vision changes or ringing in the ears. As with any medicine, there is a very remote chance of a vaccine causing a severe allergic reaction, other serious injury, or death. 5. What if there is a serious problem? An allergic reaction could occur after the vaccinated person leaves the clinic. If you see signs of a severe allergic reaction (hives, swelling of the face and throat, difficulty breathing, a fast heartbeat, dizziness, or weakness), call 9-1-1 and get the person to the nearest hospital. For other signs that concern you, call your health care provider. Adverse reactions should be reported to the Vaccine Adverse Event Reporting System (VAERS). Your health care provider will usually file this report, or you can do it yourself. Visit the VAERS website at www.vaers.SamedayNews.es or call (938)239-6039. VAERS is only for reporting reactions, and VAERS staff do not give medical advice. 6. How can I learn more?  Ask your health care provider.  Call your local or state health department.  Contact the Centers for Disease Control and Prevention (CDC): ? Call 567-670-1793 (1-800-CDC-INFO) or ? Visit CDC's website at http://hunter.com/ Vaccine Information Statement Recombinant Zoster Vaccine (01/27/2018) This information is not intended to replace advice given to you by your health care provider. Make sure you discuss any questions you have with your health care provider. Document Released: 05/27/2016 Document Revised: 07/06/2018 Document Reviewed: 10/21/2017 Elsevier Patient Education  [place].  https://www.cdc.gov/vaccines/hcp/vis/vis-statements/tdap.pdf">  Tdap Vaccine (Tetanus, Diphtheria and Pertussis): What You Need to Know 1. Why get vaccinated? Tetanus, diphtheria and pertussis are very serious diseases. Tdap vaccine can protect [place] from these diseases. And, Tdap vaccine given to pregnant women can protect newborn babies  against pertussis.Marland Kitchen TETANUS (Lockjaw) is rare in the [place] today. It causes painful muscle tightening and stiffness, usually all over the body.  It can lead to tightening of muscles in the head and neck so you can't open your mouth, swallow, or sometimes even breathe. Tetanus kills about 1 out of 10 people who are infected even after receiving the best medical care. DIPHTHERIA is also rare in the [place] today. It can cause a thick coating to form in the back of the throat.  It can lead to breathing problems, heart failure, paralysis, and death. PERTUSSIS (Whooping Cough) causes severe coughing spells, which can cause difficulty breathing, vomiting and disturbed sleep.  It can also lead to weight loss, incontinence, and rib fractures. Up to 2 in 100 adolescents and 5 in 100 adults with pertussis are hospitalized or have complications, which could include pneumonia or death. These diseases are caused by bacteria. Diphtheria and pertussis are spread from person to person through secretions from coughing or sneezing. Tetanus enters the body through cuts, scratches, or wounds. Before vaccines, as many as 200,000 cases of diphtheria, 200,000 cases of pertussis, and hundreds of cases of tetanus, were reported in the [place] each year. Since vaccination began, reports of cases for tetanus and diphtheria have dropped by about 99% and for pertussis by about 80%. 2. Tdap vaccine Tdap vaccine can protect adolescents and adults from tetanus, diphtheria, and pertussis. One dose of Tdap is routinely given at age 39 or 25. People who did not get Tdap at that age should get it as soon as possible. Tdap  is especially important for healthcare professionals and anyone having close contact with a baby younger than 12 months. Pregnant women should get a dose of Tdap during every pregnancy, to protect the newborn from pertussis. Infants are most at risk for severe, life-threatening complications from  pertussis. Another vaccine, called Td, protects against tetanus and diphtheria, but not pertussis. A Td booster should be given every 10 years. Tdap may be given as one of these boosters if you have never gotten Tdap before. Tdap may also be given after a severe cut or burn to prevent tetanus infection. Your doctor or the person giving you the vaccine can give you more information. Tdap may safely be given at the same time as other vaccines. 3. Some people should not get this vaccine  A person who has ever had a life-threatening allergic reaction after a previous dose of any diphtheria, tetanus or pertussis containing vaccine, OR has a severe allergy to any part of this vaccine, should not get Tdap vaccine. Tell the person giving the vaccine about any severe allergies.  Anyone who had coma or long repeated seizures within 7 days after a childhood dose of DTP or DTaP, or a previous dose of Tdap, should not get Tdap, unless a cause other than the vaccine was found. They can still get Td.  Talk to your doctor if you: ? have seizures or another nervous system problem, ? had severe pain or swelling after any vaccine containing diphtheria, tetanus or pertussis, ? ever had a condition called Guillain-Barr Syndrome (GBS), ? aren't feeling well on the day the shot is scheduled. 4. Risks With any medicine, including vaccines, there is a chance of side effects. These are usually mild and go away on their own. Serious reactions are also possible but are rare. Most people who get Tdap vaccine do not have any problems with it. Mild problems following Tdap (Did not interfere with activities)  Pain where the shot was given (about 3 in 4 adolescents or 2 in 3 adults)  Redness or swelling where the shot was given (about 1 person in 5)  Mild fever of at least 100.70F (up to about 1 in 25 adolescents or 1 in 100 adults)  Headache (about 3 or 4 people in 10)  Tiredness (about 1 person in 3 or 4)  Nausea,  vomiting, diarrhea, stomach ache (up to 1 in 4 adolescents or 1 in 10 adults)  Chills, sore joints (about 1 person in 10)  Body aches (about 1 person in 3 or 4)  Rash, swollen glands (uncommon) Moderate problems following Tdap (Interfered with activities, but did not require medical attention)  Pain where the shot was given (up to 1 in 5 or 6)  Redness or swelling where the shot was given (up to about 1 in 16 adolescents or 1 in 12 adults)  Fever over 102F (about 1 in 100 adolescents or 1 in 250 adults)  Headache (about 1 in 7 adolescents or 1 in 10 adults)  Nausea, vomiting, diarrhea, stomach ache (up to 1 or 3 people in 100)  Swelling of the entire arm where the shot was given (up to about 1 in 500). Severe problems following Tdap (Unable to perform usual activities; required medical attention)  Swelling, severe pain, bleeding and redness in the arm where the shot was given (rare). Problems that could happen after any vaccine:  People sometimes faint after a medical procedure, including vaccination. Sitting or lying down for about 15 minutes can help prevent  fainting, and injuries caused by a fall. Tell your doctor if you feel dizzy, or have vision changes or ringing in the ears.  Some people get severe pain in the shoulder and have difficulty moving the arm where a shot was given. This happens very rarely.  Any medication can cause a severe allergic reaction. Such reactions from a vaccine are very rare, estimated at fewer than 1 in a million doses, and would happen within a few minutes to a few hours after the vaccination. As with any medicine, there is a very remote chance of a vaccine causing a serious injury or death. The safety of vaccines is always being monitored. For more information, visit: http://www.aguilar.org/ 5. What if there is a serious problem? What should I look for?  Look for anything that concerns you, such as signs of a severe allergic reaction, very  high fever, or unusual behavior. Signs of a severe allergic reaction can include hives, swelling of the face and throat, difficulty breathing, a fast heartbeat, dizziness, and weakness. These would usually start a few minutes to a few hours after the vaccination. What should I do?  If you think it is a severe allergic reaction or other emergency that can't wait, call 9-1-1 or get the person to the nearest hospital. Otherwise, call your doctor.  Afterward, the reaction should be reported to the Vaccine Adverse Event Reporting System (VAERS). Your doctor might file this report, or you can do it yourself through the VAERS web site at www.vaers.SamedayNews.es, or by calling 502-865-1653. VAERS does not give medical advice. 6. The National Vaccine Injury Compensation Program The [employer] Vaccine Injury Compensation Program (VICP) is a federal program that was created to compensate people who may have been injured by certain vaccines. Persons who believe they may have been injured by a vaccine can learn about the program and about filing a claim by calling (940)109-5427 or visiting the [place] website at GoldCloset.com.ee. There is a time limit to file a claim for compensation. 7. How can I learn more?  Ask your doctor. He or she can give you the vaccine package insert or suggest other sources of information.  Call your local or state health department.  Contact the Centers for Disease Control and Prevention (CDC): ? Call 316-770-4075 (1-800-CDC-INFO) or ? Visit CDC's website at http://hunter.com/ Vaccine Information Statement Tdap Vaccine (05/24/2013) This information is not intended to replace advice given to you by your health care provider. Make sure you discuss any questions you have with your health care provider. Document Released: 09/16/2011 Document Revised: 11/02/2017 Document Reviewed: 11/02/2017 Elsevier Interactive Patient Education  [place].  Pneumococcal  Conjugate Vaccine (PCV13): What You Need to Know 1. Why get vaccinated? Pneumococcal conjugate vaccine (PCV13) can prevent pneumococcal disease. Pneumococcal disease refers to any illness caused by pneumococcal bacteria. These bacteria can cause many types of illnesses, including pneumonia, which is an infection of the lungs. Pneumococcal bacteria are one of the most common causes of pneumonia. Besides pneumonia, pneumococcal bacteria can also cause:  Ear infections  Sinus infections  Meningitis (infection of the tissue covering the brain and spinal cord)  Bacteremia (bloodstream infection) Anyone can get pneumococcal disease, but children under 82 years of age, people with certain medical conditions, adults 76 years or older, and cigarette smokers are at the highest risk. Most pneumococcal infections are mild. However, some can result in long-term problems, such as brain damage or hearing loss. Meningitis, bacteremia, and pneumonia caused by pneumococcal disease can be fatal.  2. PCV13 PCV13 protects against 13 types of bacteria that cause pneumococcal disease. Infants and young children usually need 4 doses of pneumococcal conjugate vaccine, at 2, 4, 6, and 28-49 months of age. In some cases, a child might need fewer than 4 doses to complete PCV13 vaccination. A dose of PCV23 vaccine is also recommended for anyone 2 years or older with certain medical conditions if they did not already receive PCV13. This vaccine may be given to adults 79 years or older based on discussions between the patient and health care provider. 3. Talk with your health care provider Tell your vaccine provider if the person getting the vaccine:  Has had an allergic reaction after a previous dose of PCV13, to an earlier pneumococcal conjugate vaccine known as PCV7, or to any vaccine containing diphtheria toxoid (for example, DTaP), or has any severe, life-threatening allergies.  In some cases, your health care provider  may decide to postpone PCV13 vaccination to a future visit. People with minor illnesses, such as a cold, may be vaccinated. People who are moderately or severely ill should usually wait until they recover before getting PCV13. Your health care provider can give you more information. 4. Risks of a vaccine reaction  Redness, swelling, pain, or tenderness where the shot is given, and fever, loss of appetite, fussiness (irritability), feeling tired, headache, and chills can happen after PCV13. Young children may be at increased risk for seizures caused by fever after PCV13 if it is administered at the same time as inactivated influenza vaccine. Ask your health care provider for more information. People sometimes faint after medical procedures, including vaccination. Tell your provider if you feel dizzy or have vision changes or ringing in the ears. As with any medicine, there is a very remote chance of a vaccine causing a severe allergic reaction, other serious injury, or death. 5. What if there is a serious problem? An allergic reaction could occur after the vaccinated person leaves the clinic. If you see signs of a severe allergic reaction (hives, swelling of the face and throat, difficulty breathing, a fast heartbeat, dizziness, or weakness), call 9-1-1 and get the person to the nearest hospital. For other signs that concern you, call your health care provider. Adverse reactions should be reported to the Vaccine Adverse Event Reporting System (VAERS). Your health care provider will usually file this report, or you can do it yourself. Visit the VAERS website at www.vaers.SamedayNews.es or call 913-421-6339. VAERS is only for reporting reactions, and VAERS staff do not give medical advice. 6. The National Vaccine Injury Compensation Program The [employer] Vaccine Injury Compensation Program (VICP) is a federal program that was created to compensate people who may have been injured by certain vaccines. Visit the  VICP website at GoldCloset.com.ee or call 956-387-1092 to learn about the program and about filing a claim. There is a time limit to file a claim for compensation. 7. How can I learn more?  Ask your health care provider.  Call your local or state health department.  Contact the Centers for Disease Control and Prevention (CDC): ? Call 762 568 7298 (1-800-CDC-INFO) or ? Visit CDC's website at http://hunter.com/ Vaccine Information Statement PCV13 Vaccine (01/27/2018) This information is not intended to replace advice given to you by your health care provider. Make sure you discuss any questions you have with your health care provider. Document Released: 01/12/2006 Document Revised: 07/06/2018 Document Reviewed: 10/27/2017 Elsevier Patient Education  [place] is a feeling of pain, discomfort, burning, or  fullness in the upper part of your abdomen. It can come and go. It may occur frequently or rarely. Indigestion tends to occur while you are eating or right after you have finished eating. It may be worse at night and while bending over or lying down. Indigestion may be a symptom of an underlying digestive condition. Follow these instructions at home: Eating and drinking   Follow an eating plan as recommended by your health care provider.  Avoid certain foods and drinks as told by your health care provider. This may include: ? Chocolate and cocoa. ? Peppermint and mint flavorings. ? Garlic and onions. ? Horseradish. ? Spicy and acidic foods, including peppers, chili powder, curry powder, vinegar, hot sauces, and barbecue sauce. ? Citrus fruits, such as oranges, lemons, and limes. ? Tomato-based foods, such as red sauce, chili, salsa, and pizza with red sauce. ? Fried and fatty foods, such as donuts, french fries, potato chips, and high-fat dressings. ? High-fat meats, such as hot dogs and fatty cuts of red and white meats, such as  rib eye steak, sausage, ham, and bacon. ? High-fat dairy items, such as whole milk, butter, and cream cheese. ? Coffee and tea (with or without caffeine). ? Drinks that contain alcohol. ? Energy drinks and sports drinks. ? Carbonated drinks or sodas. ? Citrus fruit juices.  Eat small, frequent meals instead of large meals.  Avoid drinking large amounts of liquid with your meals.  Avoid eating meals during the 2-3 hours before bedtime.  Avoid lying down right after you eat.  Avoid exercise for 2 hours after you eat. Lifestyle      Maintain a healthy weight. Ask your health care provider what weight is healthy for you. If you need to lose weight, work with your health care provider to do so safely.  Exercise for at least 30 minutes on 5 or more days each week, or as told by your health care provider. Avoid exercises that include bending forward. This can make your symptoms worse.  Wear loose-fitting clothing. Do not wear anything tight around your waist that causes pressure on your abdomen.  Do not use any products that contain nicotine or tobacco, such as cigarettes, e-cigarettes, and chewing tobacco. These can make symptoms worse. If you need help quitting, ask your health care provider.  Raise (elevate) the head of your bed about 6 inches (15 cm) when you sleep.  Try to reduce your stress, such as with yoga or meditation. If you need help reducing stress, ask your health care provider. General instructions  Take over-the-counter and prescription medicines only as told by your health care provider. Do not take aspirin, ibuprofen, or other NSAIDs unless your health care provider told you to do so.  Pay attention to any changes in your symptoms.  Keep all follow-up visits as told by your health care provider. This is important. Contact a health care provider if:  You have new symptoms.  You have unexplained weight loss.  You have difficulty swallowing, or it hurts to  swallow.  Your symptoms do not improve with treatment.  Your symptoms last for more than 2 days.  You have a fever.  You vomit. Get help right away if:  You have pain in your arms, neck, jaw, teeth, or back.  You feel sweaty, dizzy, or light-headed.  You faint.  You have chest pain or shortness of breath.  You cannot stop vomiting, or you vomit blood.  Your stool is bloody or black.  You have severe pain in your abdomen. These symptoms may represent a serious problem that is an emergency. Do not wait to see if the symptoms will go away. Get medical help right away. Call your local emergency services (911 in the [place] Do not drive yourself to the hospital. Summary  Indigestion is a feeling of pain, discomfort, burning, or fullness in the upper part of your abdomen. It tends to occur while you are eating or right after you have finished eating.  Follow an eating plan and other lifestyle changes as told by your health care provider.  Take over-the-counter and prescription medicines only as told by your health care provider. Do not take aspirin, ibuprofen, or other NSAIDs unless your health care provider told you to do so.  Contact your health care provider if your symptoms do not get better or they get worse. Some symptoms may represent a serious problem that is an emergency. Do not wait to see if the symptoms will go away. Get medical help right away. This information is not intended to replace advice given to you by your health care provider. Make sure you discuss any questions you have with your health care provider. Document Released: 04/24/2004 Document Revised: 08/17/2017 Document Reviewed: 08/17/2017 Elsevier Patient Education  2020 [employer].

## 2018-12-30 DIAGNOSIS — K9041 Non-celiac gluten sensitivity: Secondary | ICD-10-CM | POA: Diagnosis not present

## 2018-12-30 DIAGNOSIS — Z1389 Encounter for screening for other disorder: Secondary | ICD-10-CM | POA: Diagnosis not present

## 2018-12-30 DIAGNOSIS — R195 Other fecal abnormalities: Secondary | ICD-10-CM | POA: Diagnosis not present

## 2018-12-30 DIAGNOSIS — K30 Functional dyspepsia: Secondary | ICD-10-CM | POA: Diagnosis not present

## 2018-12-30 DIAGNOSIS — Z1329 Encounter for screening for other suspected endocrine disorder: Secondary | ICD-10-CM | POA: Diagnosis not present

## 2018-12-30 DIAGNOSIS — E785 Hyperlipidemia, unspecified: Secondary | ICD-10-CM | POA: Diagnosis not present

## 2018-12-30 DIAGNOSIS — R351 Nocturia: Secondary | ICD-10-CM | POA: Diagnosis not present

## 2018-12-30 DIAGNOSIS — Z1159 Encounter for screening for other viral diseases: Secondary | ICD-10-CM | POA: Diagnosis not present

## 2018-12-30 DIAGNOSIS — N401 Enlarged prostate with lower urinary tract symptoms: Secondary | ICD-10-CM | POA: Diagnosis not present

## 2018-12-30 DIAGNOSIS — Z13818 Encounter for screening for other digestive system disorders: Secondary | ICD-10-CM | POA: Diagnosis not present

## 2018-12-30 DIAGNOSIS — E559 Vitamin D deficiency, unspecified: Secondary | ICD-10-CM | POA: Diagnosis not present

## 2018-12-30 DIAGNOSIS — I10 Essential (primary) hypertension: Secondary | ICD-10-CM | POA: Diagnosis not present

## 2018-12-30 NOTE — Progress Notes (Signed)
Patient stated he will call back to schedule

## 2018-12-31 NOTE — Progress Notes (Signed)
GI Dr. Gustavo Lah would suggest rabeprazole 20 mg po daily one half hour ac or protonix for barretts   -this is similar to omeprazole in the past which gave him diarrhea  -does he want to try is rec for barretts?   [place]

## 2018-12-31 NOTE — Progress Notes (Addendum)
This patient has long segment Barrett's esophagus and I would strongly encourage PPI use if he can tolerate one.  It is not unusual for omeprazole or lansoprazole to have the diarrhea side effect.  In my experience, rabeprazole and pantoprazole are better tolerated in this regard.  I would suggest rabeprazole 20 mg po daily one half hour ac. In regard to the possibility of celiac sprue, The final serology tests don't seem to be back yet, will need those.  If you wish,  You can send patient to [facility], follow up with [facility].   As a side note, I will be retiring as of the last day of the month.  I appreciate your confidence in sending your patients  To me over the years.   Progress Notes McLean-Scocuzza, Nino Glow, MD at 12/29/2018 3:30 PM Status: Signed    Virtual Visit via Video Note  I connected with Jenny Reichmann "Jaclyn Shaggy  on 12/29/18 at 3:45 PM EDT by a video enabled telemedicine application and verified that I am speaking with the correct person using two identifiers.  Location patient: home  Location provider:work or home office  Persons participating in the virtual visit: patient, provider  I discussed the limitations of evaluation and management by telemedicine and the availability of in person appointments. The patient expressed understanding and agreed to proceed.  HPI:  1. HTN BP elevated last visit 08/2018 150/82 he reports he is not taking norvasc 5 mg qd  2. hLd taking lipitor qod 20 mg qod  3. H/o elevated PSA with BPH f/u with Dr. Erlene Quan last see n6/2020 on flomax and tolerating though he reports at times wakling up to urinate 4-6 x qhs other times 1-2 x and medium stream  4. C/o gluten intolerance and loose stools and gas and wants to be checked for celiac  ROS: See pertinent positives and negatives per HPI.  General: no wt loss  HEENT: no sore throat  CV: no chest pain  Lungs: no sob  GI: loose stools, gas  MSK: no jt pain  Neuro: no h/a  Psych: +insomnia  Skin: no  issues  GU: +BPH, +nocturia         Past Medical History:   Diagnosis Date   . 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 Depression    . GERD (gastroesophageal reflux disease)    . Heart attack ([place])    . Heart failure ([place])    . Hypertension    . Sleep apnea          Past Surgical History:   Procedure Laterality Date   . COLONOSCOPY WITH PROPOFOL N/A 04/01/2018    Procedure: COLONOSCOPY WITH PROPOFOL; Surgeon: Lollie Sails, MD; Location: [facility] ENDOSCOPY; Service: Endoscopy; Laterality: N/A;   . ESOPHAGOGASTRODUODENOSCOPY N/A 04/01/2018    Procedure: ESOPHAGOGASTRODUODENOSCOPY (EGD); Surgeon: Lollie Sails, MD; Location: [facility] ENDOSCOPY; Service: Endoscopy; Laterality: N/A;   . [place]   . VASECTOMY           Family History   Problem Relation Age of Onset   . Stroke Father    . Heart attack Father     h/o cig smoker    . Prostate cancer Father    . Heart disease Father    . Dementia Mother    . 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 atorvastatin (LIPITOR) 20 MG tablet, Take 1 tablet (20 mg total) by mouth every other day., Disp: 45 tablet, Rfl: 1  . b complex vitamins capsule, Take 1 capsule by mouth daily., Disp: , Rfl:  . CITRUS BERGAMOT PO, Take 1 tablet by mouth daily., Disp: , Rfl:  . Garlic Oil 123XX123 MG CAPS, Take 1 capsule by mouth daily., Disp: , Rfl:  . Hawthorn Berries 565 MG CAPS, Take 1 capsule by mouth daily., Disp: , Rfl:  . hydrocortisone 2.5 % cream, Apply 1 application topically 2 (two) times daily as needed. Inner thighs, Disp: 60 g, Rfl: 12  . RABEprazole (ACIPHEX) 20 MG tablet, TAKE 1 TABLET BY MOUTH ONCE DAILY, Disp: , Rfl:  . tamsulosin (FLOMAX) 0.4 MG CAPS capsule, Take 1 capsule (0.4 mg total) by mouth daily., Disp: 90  capsule, Rfl: 3  . vitamin C (ASCORBIC ACID) 500 MG tablet, Take 500 mg by mouth daily., Disp: , Rfl:  . amLODipine (NORVASC) 5 MG tablet, Take 1 tablet (5 mg total) by mouth daily. (Patient not taking: Reported on 12/29/2018), Disp: 90 tablet, Rfl: 1  . miconazole (MICATIN) 2 % cream, Apply 1 application topically 2 (two) times daily. Thighs prn, Disp: 60 g, Rfl: 12  EXAM:  VITALS per patient if applicable:  GENERAL: alert, oriented, appears well and in no acute distress  HEENT: atraumatic, conjunttiva clear, no obvious abnormalities on inspection of external nose and ears  NECK: normal movements of the head and neck  LUNGS: on inspection no signs of respiratory distress, breathing rate appears normal, no obvious gross SOB, gasping or wheezing  CV: no obvious cyanosis  MS: moves all visible extremities without noticeable abnormality  PSYCH/NEURO: pleasant and cooperative, no obvious depression or anxiety, speech and thought processing grossly intact  ASSESSMENT AND PLAN:  Discussed the following assessment and plan:  Essential hypertension - Plan: fasting labs [facility]  Will rec he take BP meds if BP elevated >130/>80  -bp check in 1-2 weeks  Intertrigo - Plan: hydrocortisone 2.5 % cream, miconazole (MICATIN) 2 % cream  Stop TMC cream  Hyperlipidemia, unspecified hyperlipidemia type - Plan: Lipid panel  On lipitor 20 mg qod  Benign prostatic hyperplasia with nocturia, elevated PSA - Plan: Urinalysis, Routine w reflex microscopic  -f/u Dr. Erlene Quan sch 09/2019  Gluten intolerance/loose stools, gas - Plan: Gliadin antibodies, serum, Tissue transglutaminase, IgA, Reticulin Antibody, IgA w reflex titer  Barrett's esophagus with dysplasia  -CC [facility] Dr. Gustavo Lah to see if needs to be on ppi prev did not tolerate omeprazole egd 03/2018 +barretts neg malignancy  HM  rec flu shot high dose our clinic or pharmacy  utd prevnar  Disc and mailed info pna 23, Tdap and shingrix  PSA with h/o BPH had 3.5  09/20/18  -will need year f/u urology Dr. Erlene Quan last seen 09/27/28 cont flomax rec  EGD 04/01/18 + barretts will need f/u in 3 years  -will [facility] Dr. Gustavo Lah to see if pt should be on PPI prev. Omeprazole caused diarrhea  Colonoscopy 04/01/18 serrated polyp negative pathology [facility]  Skin check at f/u to see if needs derm referral  [facility] [place] saw 10/2018  At f/u confirm h/o MI/CHF in the past  -we discussed possible serious and likely etiologies, options for evaluation and workup, limitations of telemedicine visit vs in person  visit, treatment, treatment risks and precautions. Pt prefers to treat via telemedicine empirically rather then risking or undertaking an in person visit at this moment. Patient agrees to seek prompt in person care if worsening, new symptoms arise, or if is not improving with treatment.  I discussed the assessment and treatment plan with the patient. The patient was provided an opportunity to ask questions and all were answered. The patient agreed with the plan and demonstrated an understanding of the instructions.  The patient was advised to call back or seek an in-person evaluation if the symptoms worsen or if the condition fails to improve as anticipated.  Time spent 25 minutes  Nino Glow McLean-Scocuzza, MD   Babs Bertin, CMA at 12/29/2018 3:30 PM Status: Signed   Patient stated he will call back to schedule

## 2019-01-02 LAB — URINALYSIS, ROUTINE W REFLEX MICROSCOPIC
Bilirubin, UA: NEGATIVE
Glucose, UA: NEGATIVE
Ketones, UA: NEGATIVE
Nitrite, UA: NEGATIVE
Protein,UA: NEGATIVE
RBC, UA: NEGATIVE
Specific Gravity, UA: 1.023 (ref 1.005–1.030)
Urobilinogen, Ur: 0.2 mg/dL (ref 0.2–1.0)
pH, UA: 6 (ref 5.0–7.5)

## 2019-01-02 LAB — CBC WITH DIFFERENTIAL/PLATELET
Basophils Absolute: 0 10*3/uL (ref 0.0–0.2)
Basos: 1 %
EOS (ABSOLUTE): 0.1 10*3/uL (ref 0.0–0.4)
Eos: 1 %
Hematocrit: 46.5 % (ref 37.5–51.0)
Hemoglobin: 16.5 g/dL (ref 13.0–17.7)
Immature Grans (Abs): 0 10*3/uL (ref 0.0–0.1)
Immature Granulocytes: 0 %
Lymphocytes Absolute: 1.3 10*3/uL (ref 0.7–3.1)
Lymphs: 24 %
MCH: 33.6 pg — ABNORMAL HIGH (ref 26.6–33.0)
MCHC: 35.5 g/dL (ref 31.5–35.7)
MCV: 95 fL (ref 79–97)
Monocytes Absolute: 0.4 10*3/uL (ref 0.1–0.9)
Monocytes: 7 %
Neutrophils Absolute: 3.7 10*3/uL (ref 1.4–7.0)
Neutrophils: 67 %
Platelets: 253 10*3/uL (ref 150–450)
RBC: 4.91 x10E6/uL (ref 4.14–5.80)
RDW: 11.9 % (ref 11.6–15.4)
WBC: 5.6 10*3/uL (ref 3.4–10.8)

## 2019-01-02 LAB — COMPREHENSIVE METABOLIC PANEL
ALT: 12 IU/L (ref 0–44)
AST: 17 IU/L (ref 0–40)
Albumin/Globulin Ratio: 1.9 (ref 1.2–2.2)
Albumin: 4.4 g/dL (ref 3.8–4.8)
Alkaline Phosphatase: 93 IU/L (ref 39–117)
BUN/Creatinine Ratio: 16 (ref 10–24)
BUN: 18 mg/dL (ref 8–27)
Bilirubin Total: 0.9 mg/dL (ref 0.0–1.2)
CO2: 24 mmol/L (ref 20–29)
Calcium: 9.6 mg/dL (ref 8.6–10.2)
Chloride: 103 mmol/L (ref 96–106)
Creatinine, Ser: 1.12 mg/dL (ref 0.76–1.27)
GFR calc Af Amer: 78 mL/min/{1.73_m2} (ref >59)
GFR calc non Af Amer: 68 mL/min/{1.73_m2} (ref >59)
Globulin, Total: 2.3 g/dL (ref 1.5–4.5)
Glucose: 92 mg/dL (ref 65–99)
Potassium: 4.5 mmol/L (ref 3.5–5.2)
Sodium: 139 mmol/L (ref 134–144)
Total Protein: 6.7 g/dL (ref 6.0–8.5)

## 2019-01-02 LAB — LIPID PANEL
Chol/HDL Ratio: 3.2 ratio (ref 0.0–5.0)
Cholesterol, Total: 138 mg/dL (ref 100–199)
HDL: 43 mg/dL (ref >39)
LDL Chol Calc (NIH): 78 mg/dL (ref 0–99)
Triglycerides: 88 mg/dL (ref 0–149)
VLDL Cholesterol Cal: 17 mg/dL (ref 5–40)

## 2019-01-02 LAB — GLIADIN ANTIBODIES, SERUM
Antigliadin Abs, IgA: 2 units (ref 0–19)
Gliadin IgG: 2 units (ref 0–19)

## 2019-01-02 LAB — RETICULIN ANTIBODIES, IGA W TITER: Reticulin Ab, IgA: NEGATIVE titer (ref <2.5)

## 2019-01-02 LAB — TSH: TSH: 4.02 u[IU]/mL (ref 0.450–4.500)

## 2019-01-02 LAB — MICROSCOPIC EXAMINATION
Bacteria, UA: NONE SEEN
Casts: NONE SEEN /lpf

## 2019-01-02 LAB — HEPATITIS C ANTIBODY: Hep C Virus Ab: <0.1 s/co ratio (ref 0.0–0.9)

## 2019-01-02 LAB — VITAMIN D 25 HYDROXY (VIT D DEFICIENCY, FRACTURES): Vit D, 25-Hydroxy: 40.9 ng/mL (ref 30.0–100.0)

## 2019-01-02 LAB — TISSUE TRANSGLUTAMINASE, IGA: Transglutaminase IgA: <2 U/mL (ref 0–3)

## 2019-01-03 ENCOUNTER — Telehealth: Payer: Self-pay

## 2019-01-03 NOTE — Telephone Encounter (Signed)
Copied from [place] [id number]. Topic: General - Inquiry >> Jan 03, 2019 11:39 AM Rainey Pines A wrote: Patient would like a callback in regards to having a shingles vaccination order/prescription that he can pick up.  Patient can be reached at [phone]

## 2019-01-04 NOTE — Telephone Encounter (Signed)
Call pt to pick up   [place]

## 2019-01-05 NOTE — Telephone Encounter (Signed)
rx has been mailed to the patient.

## 2019-02-17 ENCOUNTER — Encounter: Payer: Self-pay | Admitting: *Deleted

## 2019-03-03 ENCOUNTER — Ambulatory Visit: Payer: PPO | Admitting: Family Medicine

## 2019-03-03 ENCOUNTER — Ambulatory Visit: Payer: PPO

## 2019-03-10 ENCOUNTER — Other Ambulatory Visit: Payer: Self-pay

## 2019-03-10 ENCOUNTER — Encounter: Payer: Self-pay | Admitting: Internal Medicine

## 2019-03-10 ENCOUNTER — Ambulatory Visit (INDEPENDENT_AMBULATORY_CARE_PROVIDER_SITE_OTHER): Payer: PPO | Admitting: Internal Medicine

## 2019-03-10 VITALS — BP 135/74 | Ht 70.0 in | Wt 180.0 lb

## 2019-03-10 DIAGNOSIS — K2270 Barrett's esophagus without dysplasia: Secondary | ICD-10-CM | POA: Diagnosis not present

## 2019-03-10 DIAGNOSIS — W19XXXD Unspecified fall, subsequent encounter: Secondary | ICD-10-CM

## 2019-03-10 DIAGNOSIS — R269 Unspecified abnormalities of gait and mobility: Secondary | ICD-10-CM | POA: Diagnosis not present

## 2019-03-10 DIAGNOSIS — G4700 Insomnia, unspecified: Secondary | ICD-10-CM | POA: Diagnosis not present

## 2019-03-10 DIAGNOSIS — G4733 Obstructive sleep apnea (adult) (pediatric): Secondary | ICD-10-CM

## 2019-03-10 DIAGNOSIS — G44329 Chronic post-traumatic headache, not intractable: Secondary | ICD-10-CM

## 2019-03-10 DIAGNOSIS — E7800 Pure hypercholesterolemia, unspecified: Secondary | ICD-10-CM

## 2019-03-10 MED ORDER — ATORVASTATIN CALCIUM 20 MG PO TABS: 20.0000 mg | ORAL_TABLET | ORAL | 1 refills | Status: DC

## 2019-03-10 MED ORDER — TRAZODONE HCL 100 MG PO TABS: 100.0000 mg | ORAL_TABLET | Freq: Every evening | ORAL | 0 refills | Status: DC | PRN

## 2019-03-10 NOTE — Patient Instructions (Addendum)
Stress relax brand Tranquil sleep  Meditation   Mindfulness-Based Stress Reduction Mindfulness-based stress reduction (MBSR) is a program that helps people learn to practice mindfulness. Mindfulness is the practice of intentionally paying attention to the present moment. It can be learned and practiced through techniques such as education, breathing exercises, meditation, and yoga. MBSR includes several mindfulness techniques in one program. MBSR works best when you understand the treatment, are willing to try new things, and can commit to spending time practicing what you learn. MBSR training may include learning about:  How your emotions, thoughts, and reactions affect your body.  New ways to respond to things that cause negative thoughts to start (triggers).  How to notice your thoughts and let go of them.  Practicing awareness of everyday things that you normally do without thinking.  The techniques and goals of different types of meditation. What are the benefits of MBSR? MBSR can have many benefits, which include helping you to:  Develop self-awareness. This refers to knowing and understanding yourself.  Learn skills and attitudes that help you to participate in your own health care.  Learn new ways to care for yourself.  Be more accepting about how things are, and let things go.  Be less judgmental and approach things with an open mind.  Be patient with yourself and trust yourself more. MBSR has also been shown to:  Reduce negative emotions, such as depression and anxiety.  Improve memory and focus.  Change how you sense and approach pain.  Boost your body's ability to fight infections.  Help you connect better with other people.  Improve your sense of well-being. Follow these instructions at home:   Find a local in-person or online MBSR program.  Set aside some time regularly for mindfulness practice.  Find a mindfulness practice that works best for you. This  may include one or more of the following: ? Meditation. Meditation involves focusing your mind on a certain thought or activity. ? Breathing awareness exercises. These help you to stay present by focusing on your breath. ? Body scan. For this practice, you lie down and pay attention to each part of your body from head to toe. You can identify tension and soreness and intentionally relax parts of your body. ? Yoga. Yoga involves stretching and breathing, and it can improve your ability to move and be flexible. It can also provide an experience of testing your body's limits, which can help you release stress. ? Mindful eating. This way of eating involves focusing on the taste, texture, color, and smell of each bite of food. Because this slows down eating and helps you feel full sooner, it can be an important part of a weight-loss plan.  Find a podcast or recording that provides guidance for breathing awareness, body scan, or meditation exercises. You can listen to these any time when you have a free moment to rest without distractions.  Follow your treatment plan as told by your health care provider. This may include taking regular medicines and making changes to your diet or lifestyle as recommended. How to practice mindfulness To do a basic awareness exercise:  Find a comfortable place to sit.  Pay attention to the present moment. Observe your thoughts, feelings, and surroundings just as they are.  Avoid placing judgment on yourself, your feelings, or your surroundings. Make note of any judgment that comes up, and let it go.  Your mind may wander, and that is okay. Make note of when your thoughts drift, and  return your attention to the present moment. To do basic mindfulness meditation:  Find a comfortable place to sit. This may include a stable chair or a firm floor cushion. ? Sit upright with your back straight. Let your arms fall next to your side with your hands resting on your legs. ? If  sitting in a chair, rest your feet flat on the floor. ? If sitting on a cushion, cross your legs in front of you.  Keep your head in a neutral position with your chin dropped slightly. Relax your jaw and rest the tip of your tongue on the roof of your mouth. Drop your gaze to the floor. You can close your eyes if you like.  Breathe normally and pay attention to your breath. Feel the air moving in and out of your nose. Feel your belly expanding and relaxing with each breath.  Your mind may wander, and that is okay. Make note of when your thoughts drift, and return your attention to your breath.  Avoid placing judgment on yourself, your feelings, or your surroundings. Make note of any judgment or feelings that come up, let them go, and bring your attention back to your breath.  When you are ready, lift your gaze or open your eyes. Pay attention to how your body feels after the meditation. Where to find more information You can find more information about MBSR from:  Your health care provider.  Community-based meditation centers or programs.  Programs offered near you. Summary  Mindfulness-based stress reduction (MBSR) is a program that teaches you how to intentionally pay attention to the present moment. It is used with other treatments to help you cope better with daily stress, emotions, and pain.  MBSR focuses on developing self-awareness, which allows you to respond to life stress without judgment or negative emotions.  MBSR programs may involve learning different mindfulness practices, such as breathing exercises, meditation, yoga, body scan, or mindful eating. Find a mindfulness practice that works best for you, and set aside time for it on a regular basis. This information is not intended to replace advice given to you by your health care provider. Make sure you discuss any questions you have with your health care provider. Document Released: 07/24/2016 Document Revised: 02/27/2017  Document Reviewed: 07/24/2016 Elsevier Patient Education  [place].  Insomnia Insomnia is a sleep disorder that makes it difficult to fall asleep or stay asleep. Insomnia can cause fatigue, low energy, difficulty concentrating, mood swings, and poor performance at work or school. There are three different ways to classify insomnia:  Difficulty falling asleep.  Difficulty staying asleep.  Waking up too early in the morning. Any type of insomnia can be long-term (chronic) or short-term (acute). Both are common. Short-term insomnia usually lasts for three months or less. Chronic insomnia occurs at least three times a week for longer than three months. What are the causes? Insomnia may be caused by another condition, situation, or substance, such as:  Anxiety.  Certain medicines.  Gastroesophageal reflux disease (GERD) or other gastrointestinal conditions.  Asthma or other breathing conditions.  Restless legs syndrome, sleep apnea, or other sleep disorders.  Chronic pain.  Menopause.  Stroke.  Abuse of alcohol, tobacco, or illegal drugs.  Mental health conditions, such as depression.  Caffeine.  Neurological disorders, such as Alzheimer's disease.  An overactive thyroid (hyperthyroidism). Sometimes, the cause of insomnia may not be known. What increases the risk? Risk factors for insomnia include:  Gender. Women are affected more often  than men.  Age. Insomnia is more common as you get older.  Stress.  Lack of exercise.  Irregular work schedule or working night shifts.  Traveling between different time zones.  Certain medical and mental health conditions. What are the signs or symptoms? If you have insomnia, the main symptom is having trouble falling asleep or having trouble staying asleep. This may lead to other symptoms, such as:  Feeling fatigued or having low energy.  Feeling nervous about going to sleep.  Not feeling rested in the  morning.  Having trouble concentrating.  Feeling irritable, anxious, or depressed. How is this diagnosed? This condition may be diagnosed based on:  Your symptoms and medical history. Your health care provider may ask about: ? Your sleep habits. ? Any medical conditions you have. ? Your mental health.  A physical exam. How is this treated? Treatment for insomnia depends on the cause. Treatment may focus on treating an underlying condition that is causing insomnia. Treatment may also include:  Medicines to help you sleep.  Counseling or therapy.  Lifestyle adjustments to help you sleep better. Follow these instructions at home: Eating and drinking   Limit or avoid alcohol, caffeinated beverages, and cigarettes, especially close to bedtime. These can disrupt your sleep.  Do not eat a large meal or eat spicy foods right before bedtime. This can lead to digestive discomfort that can make it hard for you to sleep. Sleep habits   Keep a sleep diary to help you and your health care provider figure out what could be causing your insomnia. Write down: ? When you sleep. ? When you wake up during the night. ? How well you sleep. ? How rested you feel the next day. ? Any side effects of medicines you are taking. ? What you eat and drink.  Make your bedroom a dark, comfortable place where it is easy to fall asleep. ? Put up shades or blackout curtains to block light from outside. ? Use a white noise machine to block noise. ? Keep the temperature cool.  Limit screen use before bedtime. This includes: ? Watching TV. ? Using your smartphone, tablet, or computer.  Stick to a routine that includes going to bed and waking up at the same times every day and night. This can help you fall asleep faster. Consider making a quiet activity, such as reading, part of your nighttime routine.  Try to avoid taking naps during the day so that you sleep better at night.  Get out of bed if you are  still awake after 15 minutes of trying to sleep. Keep the lights down, but try reading or doing a quiet activity. When you feel sleepy, go back to bed. General instructions  Take over-the-counter and prescription medicines only as told by your health care provider.  Exercise regularly, as told by your health care provider. Avoid exercise starting several hours before bedtime.  Use relaxation techniques to manage stress. Ask your health care provider to suggest some techniques that may work well for you. These may include: ? Breathing exercises. ? Routines to release muscle tension. ? Visualizing peaceful scenes.  Make sure that you drive carefully. Avoid driving if you feel very sleepy.  Keep all follow-up visits as told by your health care provider. This is important. Contact a health care provider if:  You are tired throughout the day.  You have trouble in your daily routine due to sleepiness.  You continue to have sleep problems, or your sleep problems  get worse. Get help right away if:  You have serious thoughts about hurting yourself or someone else. If you ever feel like you may hurt yourself or others, or have thoughts about taking your own life, get help right away. You can go to your nearest emergency department or call:  Your local emergency services (911 in the [place]  A suicide crisis helpline, such as the [place] at 940-674-0430. This is open 24 hours a day. Summary  Insomnia is a sleep disorder that makes it difficult to fall asleep or stay asleep.  Insomnia can be long-term (chronic) or short-term (acute).  Treatment for insomnia depends on the cause. Treatment may focus on treating an underlying condition that is causing insomnia.  Keep a sleep diary to help you and your health care provider figure out what could be causing your insomnia. This information is not intended to replace advice given to you by your health care provider.  Make sure you discuss any questions you have with your health care provider. Document Released: 03/14/2000 Document Revised: 02/27/2017 Document Reviewed: 12/25/2016 Elsevier Patient Education  2020 [employer].

## 2019-03-10 NOTE — Progress Notes (Signed)
telephone Note  I connected with Cay Schillings  on 03/10/19 at  3:00 PM EST by a telephone and verified that I am speaking with the correct person using two identifiers.  Location patient: home Location provider:work or home office Persons participating in the virtual visit: patient, provider  I discussed the limitations of evaluation and management by telemedicine and the availability of in person appointments. The patient expressed understanding and agreed to proceed.   HPI: 1. barretts taking aciphex 20 mg qd  2. Mechanical fall 12/11/18 with almost loc hitting back of head on asphalt while playing with grandkids area on back of head was bleeding for days. Since then had h/a at top of head mild to moderate daily and balance issues denies blurry vision. H/a is worse today 3-4/10 he is drinking water daily. H/a does not radiate and not bothered by light or sound. This would be 3rd concussion  He has tried Tyleno.  3. Chronic insomnia tried trazadone 50 mg w/o relief declines addictive options He sleeps 3-4 hrs at at time but wakes up during the night  ROS: See pertinent positives and negatives per HPI.  Past Medical History:  Diagnosis Date  . 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 Depression    remote h/o suicide ideation   . GERD (gastroesophageal reflux disease)   . Hypertension   . Sleep apnea     Past Surgical History:  Procedure Laterality Date  . COLONOSCOPY WITH PROPOFOL N/A 04/01/2018   Procedure: COLONOSCOPY WITH PROPOFOL;  Surgeon: Lollie Sails, MD;  Location: [facility] ENDOSCOPY;  Service: Endoscopy;  Laterality: N/A;  . ESOPHAGOGASTRODUODENOSCOPY N/A 04/01/2018   Procedure: ESOPHAGOGASTRODUODENOSCOPY (EGD);  Surgeon: Lollie Sails, MD;  Location: [facility] ENDOSCOPY;  Service: Endoscopy;  Laterality: N/A;  . [place]  . VASECTOMY      Family History  Problem Relation Age of Onset  . Stroke Father    . Heart attack Father        h/o cig smoker   . Prostate cancer Father   . Heart disease Father   . Dementia Mother   . 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  amLODipine (NORVASC) 5 MG tablet, Take 1 tablet (5 mg total) by mouth daily., Disp: 90 tablet, Rfl: 1 .  cholecalciferol (VITAMIN D3) 10 MCG (400 UNIT) TABS tablet, Take 400 Units by mouth., Disp: , Rfl:  .  CITRUS BERGAMOT PO, Take 1 tablet by mouth daily., Disp: , Rfl:  .  hydrocortisone 2.5 % cream, Apply 1 application topically 2 (two) times daily as needed. Inner thighs, Disp: 60 g, Rfl: 12 .  miconazole (MICATIN) 2 % cream, Apply 1 application topically 2 (two) times daily. Thighs prn, Disp: 60 g, Rfl: 12 .  RABEprazole (ACIPHEX) 20 MG tablet, Take 20 mg by mouth daily., Disp: , Rfl:  .  tamsulosin (FLOMAX) 0.4 MG CAPS capsule, Take 1 capsule (0.4 mg total) by mouth daily., Disp: 90 capsule, Rfl: 3 .  atorvastatin (LIPITOR) 20 MG tablet, Take 1 tablet (20 mg total) by mouth every other day., Disp: 45 tablet, Rfl: 1 .  traZODone (DESYREL) 100 MG tablet, Take 1 tablet (100 mg total) by mouth at bedtime as  needed for sleep., Disp: 30 tablet, Rfl: 0 .  vitamin C (ASCORBIC ACID) 500 MG tablet, Take 500 mg by mouth daily., Disp: , Rfl:   EXAM:  VITALS per patient if applicable:  GENERAL: alert, oriented, appears well and in no acute distress  HEENT: atraumatic, conjunttiva clear, no obvious abnormalities on inspection of external nose and ears  PSYCH/NEURO: pleasant and cooperative, no obvious depression or anxiety, speech and thought processing grossly intact  ASSESSMENT AND PLAN:  Discussed the following assessment and plan:  Fall, subsequent encounter, abnormal gait- Plan: MR Brain Wo Contrast Chronic post-traumatic headache, not intractable - Plan:  MR Brain Wo Contrast -if continues f/u neurology Dr. Manuella Ghazi  Obstructive sleep apnea not on cpap did not like the machine  Insomnia, unspecified type - Plan: traZODone (DESYREL) 100 MG tablet increase from 50 mg   Barrett's esophagus without dysplasia - Plan: RABEprazole (ACIPHEX) 20 MG tablet  HM flu shot high dose our clinic or pharmacy; will get at pharmacy  utd prevnar  Disc and mailed info pna 23, Tdap ? When had and shingrix 1/2  PSA with h/o BPH had 3.5 09/20/18  -will need year f/u urology Dr. Erlene Quan last seen 09/27/28 cont flomax rec  appt 09/2019   EGD 04/01/18 + barretts will need f/u in 3 years -will CC [facility] Dr. Gustavo Lah to see if pt should be on PPI prev. Omeprazole caused diarrhea   Colonoscopy 04/01/18 serrated polyp negative pathology [facility]   Skin as of 03/10/19 no issues   [facility] in [place] saw 10/2018   Cards Dr. Rockey Situ consider in future last seen 01/2018 no current issues as of 03/20/19   -we discussed possible serious and likely etiologies, options for evaluation and workup, limitations of telemedicine visit vs in person visit, treatment, treatment risks and precautions. Pt prefers to treat via telemedicine empirically rather then risking or undertaking an in person visit at this moment. Patient agrees to seek prompt in person care if worsening, new symptoms arise, or if is not improving with treatment.   I discussed the assessment and treatment plan with the patient. The patient was provided an opportunity to ask questions and all were answered. The patient agreed with the plan and demonstrated an understanding of the instructions.   The patient was advised to call back or seek an in-person evaluation if the symptoms worsen or if the condition fails to improve as anticipated.  Time spent 20 minutes  Delorise Jackson, MD

## 2019-03-11 ENCOUNTER — Ambulatory Visit
Admission: RE | Admit: 2019-03-11 | Discharge: 2019-03-11 | Disposition: A | Discharge status: Home / Self Care | Payer: PPO | Source: Ambulatory Visit | Attending: Internal Medicine | Admitting: Internal Medicine

## 2019-03-11 ENCOUNTER — Other Ambulatory Visit: Payer: Self-pay

## 2019-03-11 DIAGNOSIS — W19XXXD Unspecified fall, subsequent encounter: Secondary | ICD-10-CM | POA: Insufficient documentation

## 2019-03-11 DIAGNOSIS — G44329 Chronic post-traumatic headache, not intractable: Secondary | ICD-10-CM | POA: Diagnosis not present

## 2019-03-11 DIAGNOSIS — R269 Unspecified abnormalities of gait and mobility: Secondary | ICD-10-CM | POA: Insufficient documentation

## 2019-03-11 DIAGNOSIS — R519 Headache, unspecified: Secondary | ICD-10-CM | POA: Diagnosis not present

## 2019-03-31 ENCOUNTER — Encounter: Payer: Self-pay | Admitting: Internal Medicine

## 2019-04-15 ENCOUNTER — Telehealth: Payer: Self-pay | Admitting: Internal Medicine

## 2019-04-15 DIAGNOSIS — G4700 Insomnia, unspecified: Secondary | ICD-10-CM

## 2019-04-15 MED ORDER — TRAZODONE HCL 100 MG PO TABS: 100.0000 mg | ORAL_TABLET | Freq: Every evening | ORAL | 0 refills | Status: DC | PRN

## 2019-04-15 NOTE — Telephone Encounter (Signed)
Pt is out of traZODone (DESYREL) 100 MG tablet and needs it sent to [facility] in [place] asked him to call

## 2019-04-17 ENCOUNTER — Other Ambulatory Visit: Payer: Self-pay | Admitting: Internal Medicine

## 2019-04-17 DIAGNOSIS — G4700 Insomnia, unspecified: Secondary | ICD-10-CM

## 2019-04-17 MED ORDER — TRAZODONE HCL 100 MG PO TABS: 100.0000 mg | ORAL_TABLET | Freq: Every evening | ORAL | 3 refills | Status: DC | PRN

## 2019-05-18 ENCOUNTER — Telehealth: Payer: Self-pay | Admitting: Internal Medicine

## 2019-05-18 NOTE — Telephone Encounter (Signed)
Left message for patient to call back and schedule Medicare Annual Wellness Visit (AWV) either virtually or audio only.  No hx of AWV; please schedule at anytime with Denisa O'Brien-Blaney at [facility] [place]   

## 2019-05-19 ENCOUNTER — Telehealth: Payer: Self-pay | Admitting: Internal Medicine

## 2019-05-19 NOTE — Telephone Encounter (Signed)
  No acute or reversible finding. Mild age related volume loss. Minimal small vessel change of the hemispheric white matter. Tiny old cortical infarctions at the vertex bilaterally.   FYI old strokes MRI 03/11/19  Does he need further w/u holter if so please schedule f/u?   [place]

## 2019-05-25 NOTE — Telephone Encounter (Signed)
Overdue for visit Needs appt any provider to go over MRI

## 2019-05-26 NOTE — Telephone Encounter (Signed)
Attempted to schedule.  LMOV to call office.  ° °

## 2019-06-02 NOTE — Telephone Encounter (Signed)
Attempted to schedule . Lmov to call office.  ?

## 2019-06-06 NOTE — Telephone Encounter (Signed)
Attempted to schedule.  LMOV to call office.  ° °

## 2019-06-07 ENCOUNTER — Other Ambulatory Visit: Payer: Self-pay

## 2019-06-09 ENCOUNTER — Ambulatory Visit (INDEPENDENT_AMBULATORY_CARE_PROVIDER_SITE_OTHER): Payer: PPO | Admitting: Internal Medicine

## 2019-06-09 ENCOUNTER — Encounter: Payer: Self-pay | Admitting: Internal Medicine

## 2019-06-09 ENCOUNTER — Other Ambulatory Visit: Payer: Self-pay

## 2019-06-09 VITALS — BP 138/80 | HR 74 | Temp 98.2°F | Ht 70.0 in | Wt 185.0 lb

## 2019-06-09 DIAGNOSIS — R42 Dizziness and giddiness: Secondary | ICD-10-CM | POA: Diagnosis not present

## 2019-06-09 DIAGNOSIS — R269 Unspecified abnormalities of gait and mobility: Secondary | ICD-10-CM

## 2019-06-09 DIAGNOSIS — I10 Essential (primary) hypertension: Secondary | ICD-10-CM | POA: Diagnosis not present

## 2019-06-09 DIAGNOSIS — R6889 Other general symptoms and signs: Secondary | ICD-10-CM | POA: Diagnosis not present

## 2019-06-09 DIAGNOSIS — Z87898 Personal history of other specified conditions: Secondary | ICD-10-CM

## 2019-06-09 DIAGNOSIS — E611 Iron deficiency: Secondary | ICD-10-CM

## 2019-06-09 DIAGNOSIS — Z20822 Contact with and (suspected) exposure to covid-19: Secondary | ICD-10-CM

## 2019-06-09 DIAGNOSIS — Z8673 Personal history of transient ischemic attack (TIA), and cerebral infarction without residual deficits: Secondary | ICD-10-CM

## 2019-06-09 DIAGNOSIS — L304 Erythema intertrigo: Secondary | ICD-10-CM

## 2019-06-09 DIAGNOSIS — Z87820 Personal history of traumatic brain injury: Secondary | ICD-10-CM

## 2019-06-09 DIAGNOSIS — R972 Elevated prostate specific antigen [PSA]: Secondary | ICD-10-CM

## 2019-06-09 DIAGNOSIS — Z1329 Encounter for screening for other suspected endocrine disorder: Secondary | ICD-10-CM

## 2019-06-09 DIAGNOSIS — Z1389 Encounter for screening for other disorder: Secondary | ICD-10-CM

## 2019-06-09 DIAGNOSIS — Z0000 Encounter for general adult medical examination without abnormal findings: Secondary | ICD-10-CM

## 2019-06-09 MED ORDER — MICONAZOLE NITRATE 2 % EX CREA: 1.0000 "application " | TOPICAL_CREAM | Freq: Two times a day (BID) | CUTANEOUS | 12 refills | Status: DC

## 2019-06-09 NOTE — Patient Instructions (Addendum)
Dr. Tami Ribas and Dr. Manuella Ghazi   COVID-19 Vaccine Information can be found at: [email or web address].co.uk For questions related to vaccine distribution or appointments, please email [email or web address] or call [phone].    COVID 19  [phone] in [place]   [phone] in [place]  [phone] in [place] news on website   cetaphil or cerave lotion or cream  Concussion, Adult  A concussion is a brain injury from a hard, direct hit (trauma) to the head or body. This direct hit causes the brain to shake quickly back and forth inside the skull. This can damage brain cells and cause chemical changes in the brain. A concussion may also be known as a mild traumatic brain injury (TBI). Concussions are usually not life-threatening, but the effects of a concussion can be serious. If you have a concussion, you should be very careful to avoid having a second concussion. What are the causes? This condition is caused by:  A direct hit to your head, such as: ? Running into another player during a game. ? Being hit in a fight. ? Hitting your head on a hard surface.  Sudden movement of your body that causes your brain to move back and forth inside the skull, such as in a car crash. What are the signs or symptoms? The signs of a concussion can be hard to notice. Early on, they may be missed by you, family members, and health care providers. You may look fine on the outside but may act or feel differently. Symptoms are usually temporary and most often improve in 7-10 days. Some symptoms appear right away, but other symptoms may not show up for hours or days. If your symptoms last longer than normal, you may have post-concussion syndrome. Every head injury is different. Physical symptoms  Headaches. This can include a feeling of pressure in the head or migraine-like symptoms.  Tiredness (fatigue).  Dizziness.  Problems with  coordination or balance.  Vision or hearing problems.  Sensitivity to light or noise.  Nausea or vomiting.  Changes in eating or sleeping patterns.  Numbness or tingling.  Seizure. Mental and emotional symptoms  Memory problems.  Trouble concentrating, organizing, or making decisions.  Slowness in thinking, acting or reacting, speaking, or reading.  Irritability or mood changes.  Anxiety or depression. How is this diagnosed? This condition is diagnosed based on:  Your symptoms.  A description of your injury. You may also have tests, including:  Imaging tests, such as a CT scan or MRI.  Neuropsychological tests. These measure your thinking, understanding, learning, and remembering abilities. How is this treated? Treatment for this condition includes:  Stopping sports or activity if you are injured. If you hit your head or show signs of concussion: ? Do not return to sports or activities the same day. ? Get checked by a health care provider before you return to your activities.  Physical and mental rest and careful observation, usually at home. Gradually return to your normal activities.  Medicines to help with symptoms such as headaches, nausea, or difficulty sleeping. ? Avoid taking opioid pain medicine while recovering from a concussion.  Avoiding alcohol and drugs. These may slow your recovery and can put you at risk of further injury.  Referral to a concussion clinic or rehabilitation center. Recovery from a concussion can take time. How fast you recover depends on many factors. Return to activities only when:  Your symptoms are completely gone.  Your health care provider says that  it is safe. Follow these instructions at home: Activity  Limit activities that require a lot of thought or concentration, such as: ? Doing homework or job-related work. ? Watching TV. ? Working on the computer or phone. ? Playing memory games and puzzles.  Rest. Rest helps  your brain heal. Make sure you: ? Get plenty of sleep. Most adults should get 7-9 hours of sleep each night. ? Rest during the day. Take naps or rest breaks when you feel tired.  Avoid physical activity like exercise until your health care provider says it is safe. Stop any activity that worsens symptoms.  Do not do high-risk activities that could cause a second concussion, such as riding a bike or playing sports.  Ask your health care provider when you can return to your normal activities, such as school, work, athletics, and driving. Your ability to react may be slower after a brain injury. Never do these activities if you are dizzy. Your health care provider will likely give you a plan for gradually returning to activities. General instructions   Take over-the-counter and prescription medicines only as told by your health care provider. Some medicines, such as blood thinners (anticoagulants) and aspirin, may increase the risk for complications, such as bleeding.  Do not drink alcohol until your health care provider says you can.  Watch your symptoms and tell others around you to do the same. Complications sometimes occur after a concussion. Older adults with a brain injury may have a higher risk of serious complications.  Tell your work Freight forwarder, teachers, Government social research officer, school counselor, coach, or Product/process development scientist about your injury, symptoms, and restrictions.  Keep all follow-up visits as told by your health care provider. This is important. How is this prevented? Avoiding another brain injury is very important. In rare cases, another injury can lead to permanent brain damage, brain swelling, or death. The risk of this is greatest during the first 7-10 days after a head injury. Avoid injuries by:  Stopping activities that could lead to a second concussion, such as contact or recreational sports, until your health care provider says it is okay.  Taking these actions once you have returned  to sports or activities: ? Avoiding plays or moves that can cause you to crash into another person. This is how most concussions occur. ? Following the rules and being respectful of other players. Do not engage in violent or illegal plays.  Getting regular exercise that includes strength and balance training.  Wearing a properly fitting helmet during sports, biking, or other activities. Helmets can help protect you from serious skull and brain injuries, but they do not protect you from a concussion. Even when wearing a helmet, you should avoid being hit in the head. Contact a health care provider if:  Your symptoms get worse or they do not improve.  You have new symptoms.  You have another injury. Get help right away if:  You have severe or worsening headaches.  You have weakness or numbness in any part of your body.  You are confused.  Your coordination gets worse.  You vomit repeatedly.  You are sleepier than normal.  Your speech is slurred.  You cannot recognize people or places.  You have a seizure.  It is difficult to wake you up.  You have unusual behavior changes.  You have changes in your vision.  You lose consciousness. Summary  A concussion is a brain injury that results from a hard, direct hit (trauma) to your  head or body.  You may have imaging tests and neuropsychological tests to diagnose a concussion.  Treatment for this condition includes physical and mental rest and careful observation.  Ask your health care provider when you can return to your normal activities, such as school, work, athletics, and driving.  Get help right away if you have a severe headache, weakness on one side of the body, seizures, behavior changes, changes in vision, or if you are confused or sleepier than normal. This information is not intended to replace advice given to you by your health care provider. Make sure you discuss any questions you have with your health care  provider. Document Revised: 11/05/2017 Document Reviewed: 11/05/2017 Elsevier Patient Education  [employer].  Dizziness Dizziness is a common problem. It is a feeling of unsteadiness or light-headedness. You may feel like you are about to faint. Dizziness can lead to injury if you stumble or fall. Anyone can become dizzy, but dizziness is more common in older adults. This condition can be caused by a number of things, including medicines, dehydration, or illness. Follow these instructions at home: Eating and drinking  Drink enough fluid to keep your urine clear or pale yellow. This helps to keep you from becoming dehydrated. Try to drink more clear fluids, such as water.  Do not drink alcohol.  Limit your caffeine intake if told to do so by your health care provider. Check ingredients and nutrition facts to see if a food or beverage contains caffeine.  Limit your salt (sodium) intake if told to do so by your health care provider. Check ingredients and nutrition facts to see if a food or beverage contains sodium. Activity  Avoid making quick movements. ? Rise slowly from chairs and steady yourself until you feel okay. ? In the morning, first sit up on the side of the bed. When you feel okay, stand slowly while you hold onto something until you know that your balance is fine.  If you need to stand in one place for a long time, move your legs often. Tighten and relax the muscles in your legs while you are standing.  Do not drive or use heavy machinery if you feel dizzy.  Avoid bending down if you feel dizzy. Place items in your home so that they are easy for you to reach without leaning over. Lifestyle  Do not use any products that contain nicotine or tobacco, such as cigarettes and e-cigarettes. If you need help quitting, ask your health care provider.  Try to reduce your stress level by using methods such as yoga or meditation. Talk with your health care provider if you need  help to manage your stress. General instructions  Watch your dizziness for any changes.  Take over-the-counter and prescription medicines only as told by your health care provider. Talk with your health care provider if you think that your dizziness is caused by a medicine that you are taking.  Tell a friend or a family member that you are feeling dizzy. If he or she notices any changes in your behavior, have this person call your health care provider.  Keep all follow-up visits as told by your health care provider. This is important. Contact a health care provider if:  Your dizziness does not go away.  Your dizziness or light-headedness gets worse.  You feel nauseous.  You have reduced hearing.  You have new symptoms.  You are unsteady on your feet or you feel like the room is spinning.  Get help right away if:  You vomit or have diarrhea and are unable to eat or drink anything.  You have problems talking, walking, swallowing, or using your arms, hands, or legs.  You feel generally weak.  You are not thinking clearly or you have trouble forming sentences. It may take a friend or family member to notice this.  You have chest pain, abdominal pain, shortness of breath, or sweating.  Your vision changes.  You have any bleeding.  You have a severe headache.  You have neck pain or a stiff neck.  You have a fever. These symptoms may represent a serious problem that is an emergency. Do not wait to see if the symptoms will go away. Get medical help right away. Call your local emergency services (911 in the [place] Do not drive yourself to the hospital. Summary  Dizziness is a feeling of unsteadiness or light-headedness. This condition can be caused by a number of things, including medicines, dehydration, or illness.  Anyone can become dizzy, but dizziness is more common in older adults.  Drink enough fluid to keep your urine clear or pale yellow. Do not drink  alcohol.  Avoid making quick movements if you feel dizzy. Monitor your dizziness for any changes. This information is not intended to replace advice given to you by your health care provider. Make sure you discuss any questions you have with your health care provider. Document Revised: 03/20/2017 Document Reviewed: 04/19/2016 Elsevier Patient Education  2020 [employer].
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
## 2019-06-10 ENCOUNTER — Telehealth: Payer: Self-pay | Admitting: Internal Medicine

## 2019-06-10 NOTE — Telephone Encounter (Signed)
-----   Message from Delorise Jackson, MD sent at 06/09/2019  6:12 PM EST ----- Check to see if had flu shot 2020/2021 thankstSM

## 2019-06-10 NOTE — Telephone Encounter (Signed)
-----   Message from Delorise Jackson, MD sent at 06/09/2019  6:12 PM EST ----- Call pt he will need 2nd shingrix before 09/01/19 at pharmacy if not had

## 2019-06-10 NOTE — Telephone Encounter (Signed)
Left message to return call 

## 2019-06-13 NOTE — Telephone Encounter (Signed)
Left message to return call 

## 2019-06-14 NOTE — Telephone Encounter (Signed)
We haven't been able to reach this patient .  Mailing letter and closing encounter.

## 2019-06-16 ENCOUNTER — Other Ambulatory Visit (INDEPENDENT_AMBULATORY_CARE_PROVIDER_SITE_OTHER): Payer: PPO

## 2019-06-16 ENCOUNTER — Other Ambulatory Visit: Payer: Self-pay

## 2019-06-16 DIAGNOSIS — I10 Essential (primary) hypertension: Secondary | ICD-10-CM | POA: Diagnosis not present

## 2019-06-16 DIAGNOSIS — E611 Iron deficiency: Secondary | ICD-10-CM | POA: Diagnosis not present

## 2019-06-16 DIAGNOSIS — R6889 Other general symptoms and signs: Secondary | ICD-10-CM

## 2019-06-16 LAB — IBC + FERRITIN
Ferritin: 33.6 ng/mL (ref 22.0–322.0)
Iron: 160 ug/dL (ref 42–165)
Saturation Ratios: 52.9 % — ABNORMAL HIGH (ref 20.0–50.0)
Transferrin: 216 mg/dL (ref 212.0–360.0)

## 2019-06-16 LAB — COMPREHENSIVE METABOLIC PANEL
ALT: 17 U/L (ref 0–53)
AST: 20 U/L (ref 0–37)
Albumin: 4.2 g/dL (ref 3.5–5.2)
Alkaline Phosphatase: 75 U/L (ref 39–117)
BUN: 18 mg/dL (ref 6–23)
CO2: 31 mEq/L (ref 19–32)
Calcium: 9.6 mg/dL (ref 8.4–10.5)
Chloride: 104 mEq/L (ref 96–112)
Creatinine, Ser: 1.09 mg/dL (ref 0.40–1.50)
GFR: 67.2 mL/min (ref >60.00)
Glucose, Bld: 94 mg/dL (ref 70–99)
Potassium: 4.9 mEq/L (ref 3.5–5.1)
Sodium: 138 mEq/L (ref 135–145)
Total Bilirubin: 0.8 mg/dL (ref 0.2–1.2)
Total Protein: 6.9 g/dL (ref 6.0–8.3)

## 2019-06-16 LAB — CBC WITH DIFFERENTIAL/PLATELET
Basophils Absolute: 0 10*3/uL (ref 0.0–0.1)
Basophils Relative: 0.5 % (ref 0.0–3.0)
Eosinophils Absolute: 0.1 10*3/uL (ref 0.0–0.7)
Eosinophils Relative: 1.4 % (ref 0.0–5.0)
HCT: 45.6 % (ref 39.0–52.0)
Hemoglobin: 15.9 g/dL (ref 13.0–17.0)
Lymphocytes Relative: 25.7 % (ref 12.0–46.0)
Lymphs Abs: 1.5 10*3/uL (ref 0.7–4.0)
MCHC: 34.9 g/dL (ref 30.0–36.0)
MCV: 94.1 fl (ref 78.0–100.0)
Monocytes Absolute: 0.4 10*3/uL (ref 0.1–1.0)
Monocytes Relative: 7.5 % (ref 3.0–12.0)
Neutro Abs: 3.8 10*3/uL (ref 1.4–7.7)
Neutrophils Relative %: 64.9 % (ref 43.0–77.0)
Platelets: 230 10*3/uL (ref 150.0–400.0)
RBC: 4.85 Mil/uL (ref 4.22–5.81)
RDW: 13 % (ref 11.5–15.5)
WBC: 5.9 10*3/uL (ref 4.0–10.5)

## 2019-06-16 LAB — TSH: TSH: 3.49 u[IU]/mL (ref 0.35–4.50)

## 2019-06-16 NOTE — Addendum Note (Signed)
Addended by: Tor Netters I on: 06/16/2019 11:07 AM   Modules accepted: Orders

## 2019-06-16 NOTE — Addendum Note (Signed)
Addended by: Tor Netters I on: 06/16/2019 11:09 AM   Modules accepted: Orders

## 2019-06-16 NOTE — Addendum Note (Signed)
Addended by: Tor Netters I on: 06/16/2019 11:10 AM   Modules accepted: Orders

## 2019-06-16 NOTE — Telephone Encounter (Signed)
Pt has not had a flu shot and updated vaccination record with last shingrix shot

## 2019-06-21 ENCOUNTER — Other Ambulatory Visit: Payer: Self-pay | Admitting: Internal Medicine

## 2019-06-21 DIAGNOSIS — E7800 Pure hypercholesterolemia, unspecified: Secondary | ICD-10-CM

## 2019-06-21 MED ORDER — ATORVASTATIN CALCIUM 20 MG PO TABS: 20.0000 mg | ORAL_TABLET | ORAL | 1 refills | Status: DC

## 2019-06-22 ENCOUNTER — Other Ambulatory Visit: Payer: PPO
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
## 2019-06-28 ENCOUNTER — Other Ambulatory Visit: Payer: Self-pay

## 2019-06-28 ENCOUNTER — Encounter: Payer: Self-pay | Admitting: Cardiovascular Disease

## 2019-06-28 ENCOUNTER — Ambulatory Visit (INDEPENDENT_AMBULATORY_CARE_PROVIDER_SITE_OTHER): Payer: PPO | Admitting: Cardiovascular Disease

## 2019-06-28 VITALS — BP 136/88 | HR 70 | Ht 71.0 in | Wt 183.2 lb

## 2019-06-28 DIAGNOSIS — R079 Chest pain, unspecified: Secondary | ICD-10-CM

## 2019-06-28 DIAGNOSIS — I10 Essential (primary) hypertension: Secondary | ICD-10-CM | POA: Diagnosis not present

## 2019-06-28 DIAGNOSIS — G4733 Obstructive sleep apnea (adult) (pediatric): Secondary | ICD-10-CM | POA: Diagnosis not present

## 2019-06-28 DIAGNOSIS — R55 Syncope and collapse: Secondary | ICD-10-CM | POA: Diagnosis not present

## 2019-06-28 NOTE — Patient Instructions (Addendum)

## 2019-06-29 DIAGNOSIS — R42 Dizziness and giddiness: Secondary | ICD-10-CM | POA: Diagnosis not present

## 2019-07-14 DIAGNOSIS — L821 Other seborrheic keratosis: Secondary | ICD-10-CM | POA: Diagnosis not present

## 2019-07-14 DIAGNOSIS — D2261 Melanocytic nevi of right upper limb, including shoulder: Secondary | ICD-10-CM | POA: Diagnosis not present

## 2019-07-14 DIAGNOSIS — X32XXXA Exposure to sunlight, initial encounter: Secondary | ICD-10-CM | POA: Diagnosis not present

## 2019-07-14 DIAGNOSIS — L570 Actinic keratosis: Secondary | ICD-10-CM | POA: Diagnosis not present

## 2019-07-14 DIAGNOSIS — B360 Pityriasis versicolor: Secondary | ICD-10-CM | POA: Diagnosis not present

## 2019-07-14 DIAGNOSIS — D2262 Melanocytic nevi of left upper limb, including shoulder: Secondary | ICD-10-CM | POA: Diagnosis not present

## 2019-07-14 DIAGNOSIS — D225 Melanocytic nevi of trunk: Secondary | ICD-10-CM | POA: Diagnosis not present

## 2019-07-15 ENCOUNTER — Other Ambulatory Visit: Payer: Self-pay | Admitting: Internal Medicine

## 2019-07-15 ENCOUNTER — Telehealth: Payer: Self-pay | Admitting: Internal Medicine

## 2019-07-15 DIAGNOSIS — K2270 Barrett's esophagus without dysplasia: Secondary | ICD-10-CM

## 2019-07-15 MED ORDER — RABEPRAZOLE SODIUM 20 MG PO TBEC: 20.0000 mg | DELAYED_RELEASE_TABLET | Freq: Every day | ORAL | 3 refills | Status: DC

## 2019-07-15 NOTE — Telephone Encounter (Signed)
Pt needs a refill on RABEprazole (ACIPHEX) 20 MG tablet sent to [place] [facility]  He said that his GI Doctor has retired

## 2019-07-20 DIAGNOSIS — E519 Thiamine deficiency, unspecified: Secondary | ICD-10-CM | POA: Diagnosis not present

## 2019-07-20 DIAGNOSIS — R4189 Other symptoms and signs involving cognitive functions and awareness: Secondary | ICD-10-CM | POA: Diagnosis not present

## 2019-07-20 DIAGNOSIS — E538 Deficiency of other specified B group vitamins: Secondary | ICD-10-CM | POA: Diagnosis not present

## 2019-07-20 DIAGNOSIS — R42 Dizziness and giddiness: Secondary | ICD-10-CM | POA: Diagnosis not present

## 2019-07-20 DIAGNOSIS — R2689 Other abnormalities of gait and mobility: Secondary | ICD-10-CM | POA: Diagnosis not present

## 2019-07-20 DIAGNOSIS — Z8673 Personal history of transient ischemic attack (TIA), and cerebral infarction without residual deficits: Secondary | ICD-10-CM | POA: Diagnosis not present

## 2019-07-21 DIAGNOSIS — R42 Dizziness and giddiness: Secondary | ICD-10-CM | POA: Diagnosis not present

## 2019-07-25 DIAGNOSIS — R42 Dizziness and giddiness: Secondary | ICD-10-CM | POA: Diagnosis not present

## 2019-08-25 DIAGNOSIS — R4182 Altered mental status, unspecified: Secondary | ICD-10-CM | POA: Diagnosis not present

## 2019-09-29 ENCOUNTER — Ambulatory Visit: Payer: PPO | Admitting: Urology

## 2019-10-01 NOTE — Progress Notes (Signed)
10/04/2019 1:39 PM   [name] [name] 1950-07-02 [id number]  Referring provider: Arnetha Courser, MD No address on file Chief Complaint  Patient presents with  . Elevated PSA    HPI: [name] [name] is a 69 y.o. male with a personal history of elevated PSA and BPH returns today for a 1 year follow-up.  Most recent PSA was 3.5 on 09/20/2018.  He returns today for repeat prostate cancer screening.  Has not had a PSA in the interim.  He believes he father may have had prostate cancer in his 22 s but died of other causes.    He is drinking more water.  He believes this actually helps with his urinary symptoms and he is a lot more frequency and bladder irritation if he cuts back.  He remains on Flomax.  He has been taking this for several years with excellent control of his urinary symptoms.  IPSS as below.   PSA Trend: 05/22/2016: 2.5.   05/12/2017: 3.9  06/16/2017: 4.2 09/29/2017: 3.8 09/20/2018: 3.5  10/04/2019: Pending   IPSS    Row Name 10/04/19 1300         International Prostate Symptom Score   How often have you had the sensation of not emptying your bladder? Not at All     How often have you had to urinate less than every two hours? Not at All     How often have you found you stopped and started again several times when you urinated? Not at All     How often have you found it difficult to postpone urination? Not at All     How often have you had a weak urinary stream? Less than 1 in 5 times     How often have you had to strain to start urination? Not at All     How many times did you typically get up at night to urinate? 1 Time     Total IPSS Score 2       Quality of Life due to urinary symptoms   If you were to spend the rest of your life with your urinary condition just the way it is now how would you feel about that? Delighted            Score:  1-7 Mild 8-19 Moderate 20-35 Severe    PMH: Past Medical History:  Diagnosis Date  . Blind     partially in right eye with scarring and some scarring in left eye but not blind   . Depression    remote h/o suicide ideation   . GERD (gastroesophageal reflux disease)   . Hypertension   . Sleep apnea     Surgical History: Past Surgical History:  Procedure Laterality Date  . COLONOSCOPY WITH PROPOFOL N/A 04/01/2018   Procedure: COLONOSCOPY WITH PROPOFOL;  Surgeon: Lollie Sails, MD;  Location: [facility] ENDOSCOPY;  Service: Endoscopy;  Laterality: N/A;  . ESOPHAGOGASTRODUODENOSCOPY N/A 04/01/2018   Procedure: ESOPHAGOGASTRODUODENOSCOPY (EGD);  Surgeon: Lollie Sails, MD;  Location: [facility] ENDOSCOPY;  Service: Endoscopy;  Laterality: N/A;  . [place]  . VASECTOMY      Home Medications:  Allergies as of 10/04/2019      Reactions   Omeprazole Diarrhea      Medication List       Accurate as of October 04, 2019  1:39 PM. If you have any questions, ask your nurse or doctor.        STOP taking  these medications   amLODipine 5 MG tablet Commonly known as: NORVASC Stopped by: Hollice Espy, MD   atorvastatin 20 MG tablet Commonly known as: LIPITOR Stopped by: Hollice Espy, MD   hydrocortisone 2.5 % cream Stopped by: Hollice Espy, MD   miconazole 2 % cream Commonly known as: Micatin Stopped by: Hollice Espy, MD   traZODone 100 MG tablet Commonly known as: DESYREL Stopped by: Hollice Espy, MD     TAKE these medications   b complex vitamins tablet Take 1 tablet by mouth daily.   cholecalciferol 10 MCG (400 UNIT) Tabs tablet Commonly known as: VITAMIN D3 Take 400 Units by mouth.   CITRUS BERGAMOT PO Take 1 tablet by mouth daily.   GARLIC OIL PO Take 2,423 mg by mouth daily.   Hawthorn Berries 565 MG Caps Take 1 capsule by mouth daily.   RABEprazole 20 MG tablet Commonly known as: ACIPHEX Take 1 tablet (20 mg total) by mouth daily. 30 min before breakfast or dinner   tamsulosin 0.4 MG Caps capsule Commonly known as: FLOMAX Take 1 capsule  (0.4 mg total) by mouth daily.   vitamin C 500 MG tablet Commonly known as: ASCORBIC ACID Take 500 mg by mouth daily.       Allergies:  Allergies  Allergen Reactions  . Omeprazole Diarrhea    Family History: Family History  Problem Relation Age of Onset  . Stroke Father   . Heart attack Father        h/o cig smoker   . Prostate cancer Father   . Heart disease Father   . Dementia Mother   . Heart attack Paternal Grandmother     Social History:  reports that he has never smoked. He has never used smokeless tobacco. He reports current alcohol use. He reports that he does not use drugs.   Physical Exam: BP (!) 168/91   Pulse (!) 59   Ht 6' (1.829 m)   Wt 185 lb (83.9 kg)   BMI 25.09 kg/m   Constitutional:  Alert and oriented, No acute distress. HEENT: [place] AT, moist mucus membranes.  Trachea midline, no masses. Cardiovascular: No clubbing, cyanosis, or edema. Respiratory: Normal respiratory effort, no increased work of breathing. Rectal: External hemorrhoid, normal sphincter tone, 50 g prostate, no nodules, non-tender Skin: No rashes, bruises or suspicious lesions. Neurologic: Grossly intact, no focal deficits, moving all 4 extremities. Psychiatric: Normal mood and affect.  Laboratory Data:  Lab Results  Component Value Date   CREATININE 1.09 06/16/2019      Assessment & Plan:    1. Elevated PSA PSA is pending; if normal patient can follow up with his PCP. He prefers to follow-up with urology annually for this  2. BPH with obstruction/lower urinary tract symptoms Symptoms stable on Flomax, content with this medication Discussed alternatives to pharmacotherapy including outlet procedure/surgery, not interested at this time  F/u 1 year PSA/ [facility] [address], [place], [place] [address] [phone]  I, Selena Batten, am acting as a scribe for Dr. Hollice Espy.  I have reviewed the above  documentation for accuracy and completeness, and I agree with the above.   Hollice Espy, MD

## 2019-10-04 ENCOUNTER — Other Ambulatory Visit: Payer: Self-pay

## 2019-10-04 ENCOUNTER — Ambulatory Visit: Payer: PPO | Admitting: Urology

## 2019-10-04 VITALS — BP 168/91 | HR 59 | Ht 72.0 in | Wt 185.0 lb

## 2019-10-04 DIAGNOSIS — R972 Elevated prostate specific antigen [PSA]: Secondary | ICD-10-CM

## 2019-10-04 DIAGNOSIS — H02889 Meibomian gland dysfunction of unspecified eye, unspecified eyelid: Secondary | ICD-10-CM | POA: Diagnosis not present

## 2019-10-04 DIAGNOSIS — H2513 Age-related nuclear cataract, bilateral: Secondary | ICD-10-CM | POA: Diagnosis not present

## 2019-10-04 DIAGNOSIS — H02886 Meibomian gland dysfunction of left eye, unspecified eyelid: Secondary | ICD-10-CM | POA: Diagnosis not present

## 2019-10-04 DIAGNOSIS — H31003 Unspecified chorioretinal scars, bilateral: Secondary | ICD-10-CM | POA: Diagnosis not present

## 2019-10-04 MED ORDER — TAMSULOSIN HCL 0.4 MG PO CAPS: 0.4000 mg | ORAL_CAPSULE | Freq: Every day | ORAL | 3 refills | Status: DC

## 2019-10-05 ENCOUNTER — Telehealth: Payer: Self-pay

## 2019-10-05 LAB — PSA: Prostate Specific Ag, Serum: 5 ng/mL — ABNORMAL HIGH (ref 0.0–4.0)

## 2019-10-05 NOTE — Telephone Encounter (Signed)
-----   Message from Hollice Espy, MD sent at 10/05/2019 10:46 AM EDT ----- PSA is up to 5.0 this year.  Lets check again in about 4 months and see if it comes right back down like it did in the past.  Please schedule lab visit only in 4 months, will let him know once we get these results with the next steps are.  If he does not hear back from [place] within a few days of getting his lab, please have him contact us.  Hollice Espy, MD

## 2019-10-05 NOTE — Telephone Encounter (Signed)
Patient aware of results and appointment. Scheduled appointment for 02/07/2020 for PSA lab only

## 2019-11-30 ENCOUNTER — Telehealth: Payer: Self-pay | Admitting: Internal Medicine

## 2019-11-30 NOTE — Telephone Encounter (Signed)
Left message for patient to call back and schedule Medicare Annual Wellness Visit (AWV)  ° °This should be a telephone visit only=30 minutes. ° °No hx of AWV; please schedule at anytime with Denisa O'Brien-Blaney at [place] ° ° °

## 2020-02-07 ENCOUNTER — Other Ambulatory Visit: Payer: Self-pay

## 2020-04-12 ENCOUNTER — Telehealth: Payer: Self-pay | Admitting: Internal Medicine

## 2020-04-12 NOTE — Telephone Encounter (Signed)
Left message for patient to call back and schedule Medicare Annual Wellness Visit (AWV)   This should be a virtual visit only=30 minutes.  No hx of AWV; please schedule at anytime with Denisa O'Brien-Blaney at [place]   

## 2020-06-08 ENCOUNTER — Telehealth: Payer: Self-pay

## 2020-06-08 NOTE — Addendum Note (Signed)
Addended by: Orland Mustard on: 06/08/2020 05:18 PM   Modules accepted: Orders

## 2020-06-08 NOTE — Telephone Encounter (Signed)
Pt has yearly f/u on 06/15/20 and would like fasting labs prior to appt

## 2020-06-08 NOTE — Telephone Encounter (Signed)
Please place orders and I will get patient scheduled

## 2020-06-08 NOTE — Telephone Encounter (Signed)
Please sch fasting labs early week of 3/14 with same day of wife

## 2020-06-11 NOTE — Telephone Encounter (Signed)
Notify both husband and wife please my patients [name] [name] as well  Due to orders being placed after initial request for labs they will have to notify lab they would like these as well orders in  Also financially responsible if insurance does not cover cost and I dont know the cost of this test

## 2020-06-11 NOTE — Telephone Encounter (Signed)
Patients wife informed and verbalized understanding

## 2020-06-11 NOTE — Telephone Encounter (Signed)
Pt rescheduled lab and yearly appt for April. He would like to have covid antibodies test at same time as labs. Please place order

## 2020-06-11 NOTE — Addendum Note (Signed)
Addended by: Orland Mustard on: 06/11/2020 11:31 AM   Modules accepted: Orders

## 2020-06-11 NOTE — Telephone Encounter (Signed)
Okay to place order? °

## 2020-06-11 NOTE — Telephone Encounter (Signed)
Left message to return call 

## 2020-06-11 NOTE — Telephone Encounter (Signed)
LVM to schedule labs prior to appt on 06/15/20

## 2020-06-11 NOTE — Addendum Note (Signed)
Addended by: Orland Mustard on: 06/11/2020 11:23 AM   Modules accepted: Orders

## 2020-06-15 ENCOUNTER — Ambulatory Visit: Payer: PPO | Admitting: Internal Medicine

## 2020-06-18 ENCOUNTER — Ambulatory Visit (INDEPENDENT_AMBULATORY_CARE_PROVIDER_SITE_OTHER): Payer: PPO

## 2020-06-18 VITALS — Ht 72.0 in | Wt 185.0 lb

## 2020-06-18 DIAGNOSIS — Z0000 Encounter for general adult medical examination without abnormal findings: Secondary | ICD-10-CM

## 2020-06-18 NOTE — Patient Instructions (Addendum)
[name] , Thank you for taking time to come for your Medicare Wellness Visit. I appreciate your ongoing commitment to your health goals. Please review the following plan we discussed and let me know if I can assist you in the future.   These are the goals we discussed: Goals    . Follow up with Primary Care Provider     As needed       This is a list of the screening recommended for you and due dates:  Health Maintenance  Topic Date Due  . Tetanus Vaccine  04/14/2026  . Colon Cancer Screening  04/01/2028  .  Hepatitis C: One time screening is recommended by Center for Disease Control  (CDC) for  adults born from [birth year] through 1965.   Completed  . HPV Vaccine  Aged Out  . Flu Shot  Discontinued  . COVID-19 Vaccine  Discontinued  . Pneumonia vaccines  Discontinued    Immunizations Immunization History  Administered Date(s) Administered  . Influenza Split 01/01/2015  . Influenza,inj,Quad PF,6+ Mos 12/21/2017  . Pneumococcal Conjugate-13 12/21/2017  . Zoster Recombinat (Shingrix) 03/03/2019, 05/11/2019   Keep all routine maintenance appointments.   Next scheduled lab 07/17/20 @ 8:45  Cpe 07/19/20 @ 2:30  Advanced directives: will update record when complete.  Conditions/risks identified: none new.  Follow up in one year for your annual wellness visit.   Preventive Care 83 Years and Older, Male Preventive care refers to lifestyle choices and visits with your health care provider that can promote health and wellness. What does preventive care include?  A yearly physical exam. This is also called an annual well check.  Dental exams once or twice a year.  Routine eye exams. Ask your health care provider how often you should have your eyes checked.  Personal lifestyle choices, including:  Daily care of your teeth and gums.  Regular physical activity.  Eating a healthy diet.  Avoiding tobacco and drug use.  Limiting alcohol use.  Practicing safe sex.  Taking  low doses of aspirin every day.  Taking vitamin and mineral supplements as recommended by your health care provider. What happens during an annual well check? The services and screenings done by your health care provider during your annual well check will depend on your age, overall health, lifestyle risk factors, and family history of disease. Counseling  Your health care provider may ask you questions about your:  Alcohol use.  Tobacco use.  Drug use.  Emotional well-being.  Home and relationship well-being.  Sexual activity.  Eating habits.  History of falls.  Memory and ability to understand (cognition).  Work and work Statistician. Screening  You may have the following tests or measurements:  Height, weight, and BMI.  Blood pressure.  Lipid and cholesterol levels. These may be checked every 5 years, or more frequently if you are over 64 years old.  Skin check.  Lung cancer screening. You may have this screening every year starting at age 62 if you have a 30-pack-year history of smoking and currently smoke or have quit within the past 15 years.  Fecal occult blood test (FOBT) of the stool. You may have this test every year starting at age 68.  Flexible sigmoidoscopy or colonoscopy. You may have a sigmoidoscopy every 5 years or a colonoscopy every 10 years starting at age 75.  Prostate cancer screening. Recommendations will vary depending on your family history and other risks.  Hepatitis C blood test.  Hepatitis B blood test.  Sexually transmitted disease (STD) testing.  Diabetes screening. This is done by checking your blood sugar (glucose) after you have not eaten for a while (fasting). You may have this done every 1-3 years.  Abdominal aortic aneurysm (AAA) screening. You may need this if you are a current or former smoker.  Osteoporosis. You may be screened starting at age 72 if you are at high risk. Talk with your health care provider about your test  results, treatment options, and if necessary, the need for more tests. Vaccines  Your health care provider may recommend certain vaccines, such as:  Influenza vaccine. This is recommended every year.  Tetanus, diphtheria, and acellular pertussis (Tdap, Td) vaccine. You may need a Td booster every 10 years.  Zoster vaccine. You may need this after age 79.  Pneumococcal 13-valent conjugate (PCV13) vaccine. One dose is recommended after age 50.  Pneumococcal polysaccharide (PPSV23) vaccine. One dose is recommended after age 74. Talk to your health care provider about which screenings and vaccines you need and how often you need them. This information is not intended to replace advice given to you by your health care provider. Make sure you discuss any questions you have with your health care provider. Document Released: 04/13/2015 Document Revised: 12/05/2015 Document Reviewed: 01/16/2015 Elsevier Interactive Patient Education  2017 [place] Prevention in the Home Falls can cause injuries. They can happen to people of all ages. There are many things you can do to make your home safe and to help prevent falls. What can I do on the outside of my home?  Regularly fix the edges of walkways and driveways and fix any cracks.  Remove anything that might make you trip as you walk through a door, such as a raised step or threshold.  Trim any bushes or trees on the path to your home.  Use bright outdoor lighting.  Clear any walking paths of anything that might make someone trip, such as rocks or tools.  Regularly check to see if handrails are loose or broken. Make sure that both sides of any steps have handrails.  Any raised decks and porches should have guardrails on the edges.  Have any leaves, snow, or ice cleared regularly.  Use sand or salt on walking paths during winter.  Clean up any spills in your garage right away. This includes oil or grease spills. What can I do in  the bathroom?  Use night lights.  Install grab bars by the toilet and in the tub and shower. Do not use towel bars as grab bars.  Use non-skid mats or decals in the tub or shower.  If you need to sit down in the shower, use a plastic, non-slip stool.  Keep the floor dry. Clean up any water that spills on the floor as soon as it happens.  Remove soap buildup in the tub or shower regularly.  Attach bath mats securely with double-sided non-slip rug tape.  Do not have throw rugs and other things on the floor that can make you trip. What can I do in the bedroom?  Use night lights.  Make sure that you have a light by your bed that is easy to reach.  Do not use any sheets or blankets that are too big for your bed. They should not hang down onto the floor.  Have a firm chair that has side arms. You can use this for support while you get dressed.  Do not have throw rugs and other  things on the floor that can make you trip. What can I do in the kitchen?  Clean up any spills right away.  Avoid walking on wet floors.  Keep items that you use a lot in easy-to-reach places.  If you need to reach something above you, use a strong step stool that has a grab bar.  Keep electrical cords out of the way.  Do not use floor polish or wax that makes floors slippery. If you must use wax, use non-skid floor wax.  Do not have throw rugs and other things on the floor that can make you trip. What can I do with my stairs?  Do not leave any items on the stairs.  Make sure that there are handrails on both sides of the stairs and use them. Fix handrails that are broken or loose. Make sure that handrails are as long as the stairways.  Check any carpeting to make sure that it is firmly attached to the stairs. Fix any carpet that is loose or worn.  Avoid having throw rugs at the top or bottom of the stairs. If you do have throw rugs, attach them to the floor with carpet tape.  Make sure that you  have a light switch at the top of the stairs and the bottom of the stairs. If you do not have them, ask someone to add them for you. What else can I do to help prevent falls?  Wear shoes that:  Do not have high heels.  Have rubber bottoms.  Are comfortable and fit you well.  Are closed at the toe. Do not wear sandals.  If you use a stepladder:  Make sure that it is fully opened. Do not climb a closed stepladder.  Make sure that both sides of the stepladder are locked into place.  Ask someone to hold it for you, if possible.  Clearly mark and make sure that you can see:  Any grab bars or handrails.  First and last steps.  Where the edge of each step is.  Use tools that help you move around (mobility aids) if they are needed. These include:  Canes.  Walkers.  Scooters.  Crutches.  Turn on the lights when you go into a dark area. Replace any light bulbs as soon as they burn out.  Set up your furniture so you have a clear path. Avoid moving your furniture around.  If any of your floors are uneven, fix them.  If there are any pets around you, be aware of where they are.  Review your medicines with your doctor. Some medicines can make you feel dizzy. This can increase your chance of falling. Ask your doctor what other things that you can do to help prevent falls. This information is not intended to replace advice given to you by your health care provider. Make sure you discuss any questions you have with your health care provider. Document Released: 01/11/2009 Document Revised: 08/23/2015 Document Reviewed: 04/21/2014 Elsevier Interactive Patient Education  2017 [employer].

## 2020-06-18 NOTE — Progress Notes (Addendum)
Subjective:   [name] [name] is a 70 y.o. male who presents for an Initial Medicare Annual Wellness Visit.  Review of Systems    No ROS.  Medicare Wellness Virtual Visit.    Cardiac Risk Factors include: advanced age (>25men, >52 women);male gender     Objective:    Today's Vitals   06/18/20 1303  Weight: 185 lb (83.9 kg)  Height: 6' (1.829 m)   Body mass index is 25.09 kg/m.  Advanced Directives 06/18/2020 04/01/2018  Does Patient Have a Medical Advance Directive? No No  Would patient like information on creating a medical advance directive? No - Patient declined -    Current Medications (verified) Outpatient Encounter Medications as of 06/18/2020  Medication Sig  . b complex vitamins tablet Take 1 tablet by mouth daily.  . cholecalciferol (VITAMIN D3) 10 MCG (400 UNIT) TABS tablet Take 400 Units by mouth.  Marland Kitchen CITRUS BERGAMOT PO Take 1 tablet by mouth daily.  Marland Kitchen GARLIC OIL PO Take 1,610 mg by mouth daily.  Marland Kitchen [place] Berries 565 MG CAPS Take 1 capsule by mouth daily.  . RABEprazole (ACIPHEX) 20 MG tablet Take 1 tablet (20 mg total) by mouth daily. 30 min before breakfast or dinner  . tamsulosin (FLOMAX) 0.4 MG CAPS capsule Take 1 capsule (0.4 mg total) by mouth daily.  . vitamin C (ASCORBIC ACID) 500 MG tablet Take 500 mg by mouth daily.   No facility-administered encounter medications on file as of 06/18/2020.    Allergies (verified) Omeprazole   History: Past Medical History:  Diagnosis Date  . 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 Depression    remote h/o suicide ideation   . GERD (gastroesophageal reflux disease)   . Hypertension   . Sleep apnea    Past Surgical History:  Procedure Laterality Date  . COLONOSCOPY WITH PROPOFOL N/A 04/01/2018   Procedure: COLONOSCOPY WITH PROPOFOL;  Surgeon: Lollie Sails, MD;  Location: [facility] ENDOSCOPY;  Service: Endoscopy;  Laterality: N/A;  .  ESOPHAGOGASTRODUODENOSCOPY N/A 04/01/2018   Procedure: ESOPHAGOGASTRODUODENOSCOPY (EGD);  Surgeon: Lollie Sails, MD;  Location: [facility] ENDOSCOPY;  Service: Endoscopy;  Laterality: N/A;  . [place]  . VASECTOMY     Family History  Problem Relation Age of Onset  . Stroke Father   . Heart attack Father        h/o cig smoker   . Prostate cancer Father   . Heart disease Father   . Dementia Mother   . Heart attack Paternal Grandmother    Social History   Socioeconomic History  . Marital status: Married    Spouse name: Judie Grieve  . Number of children: 2  . Years of education: 28  . Highest education level: Associate degree: academic program  Occupational History  . Occupation: retired  Tobacco Use  . Smoking status: Never Smoker  . Smokeless tobacco: Never Used  Vaping Use  . Vaping Use: Never used  Substance and Sexual Activity  . Alcohol use: Yes    Alcohol/week: 0.0 standard drinks  . Drug use: No  . Sexual activity: Yes    Partners: Female  Other Topics Concern  . 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 Difficulty of Paying Living Expenses: Not hard at all  Food Insecurity: No Food Insecurity  . Worried About Charity fundraiser in the Last Year: Never true  . Ran Out of Food in the Last Year: Never true  Transportation Needs: No Transportation Needs  . Lack of Transportation (Medical): No  . Lack of Transportation (Non-Medical): No  Physical Activity: Sufficiently Active  . Days of Exercise per Week: 3 days  . Minutes of Exercise per Session: 60 min  Stress: No Stress Concern Present  . Feeling of Stress : Not at all  Social Connections: Socially Integrated  . Frequency of Communication with Friends and Family: Three times a week  . Frequency of Social  Gatherings with Friends and Family: Three times a week  . Attends Religious Services: More than 4 times per year  . Active Member of Clubs or Organizations: Yes  . Attends Archivist Meetings: More than 4 times per year  . Marital Status: Married    Tobacco Counseling Counseling given: Not Answered   Clinical Intake:  Pre-visit preparation completed: Yes        Diabetes: No  How often do you need to have someone help you when you read instructions, pamphlets, or other written materials from your doctor or pharmacy?: 1 - Never  Interpreter Needed?: No    Activities of Daily Living In your present state of health, do you have any difficulty performing the following activities: 06/18/2020  Hearing? N  Vision? N  Difficulty concentrating or making decisions? N  Walking or climbing stairs? N  Dressing or bathing? N  Doing errands, shopping? N  Preparing Food and eating ? N  Using the Toilet? N  In the past six months, have you accidently leaked urine? N  Do you have problems with loss of bowel control? N  Managing your Medications? N  Managing your Finances? N  Housekeeping or managing your Housekeeping? N  Some recent data might be hidden    Patient Care Team: McLean-Scocuzza, Nino Glow, MD as PCP - General (Internal Medicine) Lada, Satira Anis, MD (Family Medicine) Hollice Espy, MD as Consulting Physician (Urology) Lollie Sails, MD (Inactive) as Consulting Physician (Gastroenterology)  Indicate any recent Medical Services you may have received from other than [facility] providers in the past year (date may be approximate).     Assessment:   This is a routine wellness examination for [name] [name].  I connected with [name] [name] today by telephone and verified that I am speaking with the correct person using two identifiers. Location patient: home Location provider: work Persons participating in the virtual visit: patient, Marine scientist.    I discussed the limitations, risks,  security and privacy concerns of performing an evaluation and management service by telephone and the availability of in person appointments. The patient expressed understanding and verbally consented to this telephonic visit.    Interactive audio and video telecommunications were attempted between this provider and patient, however failed, due to patient having technical difficulties OR patient did not have access to video capability.  We continued and completed visit with audio only.  Some vital signs may be absent or patient reported.   Hearing/Vision screen  Hearing Screening   125Hz  250Hz  500Hz  1000Hz  2000Hz  3000Hz  4000Hz  6000Hz  8000Hz   Right ear:           Left ear:           Comments: Patient is able  to hear conversational tones without difficulty.  No issues reported.   Vision Screening Comments: Followed by Dr. Ellin Mayhew Visual acuity not assessed, virtual visit.  They have seen their ophthalmologist in the last 12 months.     Dietary issues and exercise activities discussed: Current Exercise Habits: Home exercise routine, Type of exercise: walking, Time (Minutes): 60, Frequency (Times/Week): 3, Weekly Exercise (Minutes/Week): 180, Intensity: Mild  Regular diet Good water intake  Goals    . Follow up with Primary Care Provider     As needed      Depression Screen PHQ 2/9 Scores 06/18/2020 06/09/2019 12/29/2018 08/24/2018 02/22/2018 01/19/2018 01/05/2018  PHQ - 2 Score 0 0 0 0 0 0 0  PHQ- 9 Score - - - 0 0 - 6    Fall Risk Fall Risk  06/18/2020 06/09/2019 12/29/2018 08/24/2018 02/22/2018  Falls in the past year? 0 1 0 0 0  Number falls in past yr: 0 0 - - 0  Injury with Fall? 0 1 - - -  Risk for fall due to : - History of fall(s);Impaired vision - - -  Follow up Falls evaluation completed Falls evaluation completed - - -    FALL RISK PREVENTION PERTAINING TO THE HOME: Handrails in use when climbing stairs? Yes Home free of loose throw rugs in walkways, pet beds, electrical  cords, etc? Yes  Adequate lighting in your home to reduce risk of falls? Yes   ASSISTIVE DEVICES UTILIZED TO PREVENT FALLS: Use of a cane, walker or w/c? No   TIMED UP AND GO: Was the test performed? No . Virtual visit.   Cognitive Function:  Patient is alert and oriented x3.  Denies difficulty focusing, making decisions, memory loss.  Manages own finances.  MMSE/6CIT deferred. Normal by direct communication/observation.        Immunizations Immunization History  Administered Date(s) Administered  . Influenza Split 01/01/2015  . Influenza,inj,Quad PF,6+ Mos 12/21/2017  . Pneumococcal Conjugate-13 12/21/2017  . Tdap 04/14/2016  . Zoster Recombinat (Shingrix) 03/03/2019, 05/11/2019   Vaccines- Covid, Influenza, PNA- discontinued per patient preference.   Health Maintenance Health Maintenance  Topic Date Due  . TETANUS/TDAP  04/14/2026  . COLONOSCOPY (Pts 45-62yrs Insurance coverage will need to be confirmed)  04/01/2028  . Hepatitis C Screening  Completed  . HPV VACCINES  Aged Out  . INFLUENZA VACCINE  Discontinued  . COVID-19 Vaccine  Discontinued  . PNA vac Low Risk Adult  Discontinued   Colorectal cancer screening: Type of screening: Colonoscopy. Completed 04/01/18. Repeat every 10 years  Lung Cancer Screening: (Low Dose CT Chest recommended if Age 28-80 years, 30 pack-year currently smoking OR have quit w/in 15years.) does not qualify.   Vision Screening: Recommended annual ophthalmology exams for early detection of glaucoma and other disorders of the eye. Is the patient up to date with their annual eye exam?  Yes  Who is the provider or what is the name of the office in which the patient attends annual eye exams? Dr. Ellin Mayhew.  Dental Screening: Recommended annual dental exams for proper oral hygiene. Visits every 6 months.   Community Resource Referral / Chronic Care Management: CRR required this visit?  No   CCM required this visit?  No      Plan:   Keep  all routine maintenance appointments.   Next scheduled lab 07/17/20 @ 8:45  Cpe 07/19/20 @ 2:30  I have personally reviewed and noted the following in the patient's chart:   . Medical and  social history . Use of alcohol, tobacco or illicit drugs  . Current medications and supplements . Functional ability and status . Nutritional status . Physical activity . Advanced directives . List of other physicians . Hospitalizations, surgeries, and ER visits in previous 12 months . Vitals . Screenings to include cognitive, depression, and falls . Referrals and appointments  In addition, I have reviewed and discussed with patient certain preventive protocols, quality metrics, and best practice recommendations. A written personalized care plan for preventive services as well as general preventive health recommendations were provided to patient via mychart.     Varney Biles, LPN   [id number]     Agree with plan. Mable Paris, NP

## 2020-07-16 DIAGNOSIS — D2262 Melanocytic nevi of left upper limb, including shoulder: Secondary | ICD-10-CM | POA: Diagnosis not present

## 2020-07-16 DIAGNOSIS — X32XXXA Exposure to sunlight, initial encounter: Secondary | ICD-10-CM | POA: Diagnosis not present

## 2020-07-16 DIAGNOSIS — D2271 Melanocytic nevi of right lower limb, including hip: Secondary | ICD-10-CM | POA: Diagnosis not present

## 2020-07-16 DIAGNOSIS — D2272 Melanocytic nevi of left lower limb, including hip: Secondary | ICD-10-CM | POA: Diagnosis not present

## 2020-07-16 DIAGNOSIS — D225 Melanocytic nevi of trunk: Secondary | ICD-10-CM | POA: Diagnosis not present

## 2020-07-16 DIAGNOSIS — L821 Other seborrheic keratosis: Secondary | ICD-10-CM | POA: Diagnosis not present

## 2020-07-16 DIAGNOSIS — L570 Actinic keratosis: Secondary | ICD-10-CM | POA: Diagnosis not present

## 2020-07-16 DIAGNOSIS — L820 Inflamed seborrheic keratosis: Secondary | ICD-10-CM | POA: Diagnosis not present

## 2020-07-16 DIAGNOSIS — D2261 Melanocytic nevi of right upper limb, including shoulder: Secondary | ICD-10-CM | POA: Diagnosis not present

## 2020-07-17 ENCOUNTER — Other Ambulatory Visit (INDEPENDENT_AMBULATORY_CARE_PROVIDER_SITE_OTHER): Payer: PPO

## 2020-07-17 ENCOUNTER — Other Ambulatory Visit: Payer: Self-pay

## 2020-07-17 DIAGNOSIS — Z1389 Encounter for screening for other disorder: Secondary | ICD-10-CM | POA: Diagnosis not present

## 2020-07-17 DIAGNOSIS — Z1329 Encounter for screening for other suspected endocrine disorder: Secondary | ICD-10-CM | POA: Diagnosis not present

## 2020-07-17 DIAGNOSIS — R972 Elevated prostate specific antigen [PSA]: Secondary | ICD-10-CM

## 2020-07-17 DIAGNOSIS — I10 Essential (primary) hypertension: Secondary | ICD-10-CM | POA: Diagnosis not present

## 2020-07-17 DIAGNOSIS — Z20822 Contact with and (suspected) exposure to covid-19: Secondary | ICD-10-CM | POA: Diagnosis not present

## 2020-07-17 LAB — CBC WITH DIFFERENTIAL/PLATELET
Basophils Absolute: 0 10*3/uL (ref 0.0–0.1)
Basophils Relative: 0.7 % (ref 0.0–3.0)
Eosinophils Absolute: 0.1 10*3/uL (ref 0.0–0.7)
Eosinophils Relative: 1.8 % (ref 0.0–5.0)
HCT: 43 % (ref 39.0–52.0)
Hemoglobin: 14.8 g/dL (ref 13.0–17.0)
Lymphocytes Relative: 26.7 % (ref 12.0–46.0)
Lymphs Abs: 1.2 10*3/uL (ref 0.7–4.0)
MCHC: 34.5 g/dL (ref 30.0–36.0)
MCV: 91.5 fl (ref 78.0–100.0)
Monocytes Absolute: 0.3 10*3/uL (ref 0.1–1.0)
Monocytes Relative: 7.8 % (ref 3.0–12.0)
Neutro Abs: 2.8 10*3/uL (ref 1.4–7.7)
Neutrophils Relative %: 63 % (ref 43.0–77.0)
Platelets: 228 10*3/uL (ref 150.0–400.0)
RBC: 4.71 Mil/uL (ref 4.22–5.81)
RDW: 13.3 % (ref 11.5–15.5)
WBC: 4.5 10*3/uL (ref 4.0–10.5)

## 2020-07-17 LAB — COMPREHENSIVE METABOLIC PANEL
ALT: 11 U/L (ref 0–53)
AST: 15 U/L (ref 0–37)
Albumin: 4 g/dL (ref 3.5–5.2)
Alkaline Phosphatase: 73 U/L (ref 39–117)
BUN: 17 mg/dL (ref 6–23)
CO2: 29 mEq/L (ref 19–32)
Calcium: 9.3 mg/dL (ref 8.4–10.5)
Chloride: 103 mEq/L (ref 96–112)
Creatinine, Ser: 1.04 mg/dL (ref 0.40–1.50)
GFR: 73.3 mL/min (ref >60.00)
Glucose, Bld: 91 mg/dL (ref 70–99)
Potassium: 4.5 mEq/L (ref 3.5–5.1)
Sodium: 138 mEq/L (ref 135–145)
Total Bilirubin: 0.8 mg/dL (ref 0.2–1.2)
Total Protein: 6.6 g/dL (ref 6.0–8.3)

## 2020-07-17 LAB — LIPID PANEL
Cholesterol: 187 mg/dL (ref 0–200)
HDL: 42.4 mg/dL (ref >39.00)
LDL Cholesterol: 118 mg/dL — ABNORMAL HIGH (ref 0–99)
NonHDL: 144.11
Total CHOL/HDL Ratio: 4
Triglycerides: 130 mg/dL (ref 0.0–149.0)
VLDL: 26 mg/dL (ref 0.0–40.0)

## 2020-07-17 LAB — TSH: TSH: 4.41 u[IU]/mL (ref 0.35–4.50)

## 2020-07-18 LAB — URINALYSIS, ROUTINE W REFLEX MICROSCOPIC
Bilirubin, UA: NEGATIVE
Glucose, UA: NEGATIVE
Ketones, UA: NEGATIVE
Leukocytes,UA: NEGATIVE
Nitrite, UA: NEGATIVE
Protein,UA: NEGATIVE
RBC, UA: NEGATIVE
Specific Gravity, UA: 1.016 (ref 1.005–1.030)
Urobilinogen, Ur: 0.2 mg/dL (ref 0.2–1.0)
pH, UA: 6 (ref 5.0–7.5)

## 2020-07-19 ENCOUNTER — Encounter: Payer: Self-pay | Admitting: Internal Medicine

## 2020-07-19 ENCOUNTER — Other Ambulatory Visit: Payer: Self-pay

## 2020-07-19 ENCOUNTER — Telehealth: Payer: Self-pay

## 2020-07-19 ENCOUNTER — Ambulatory Visit (INDEPENDENT_AMBULATORY_CARE_PROVIDER_SITE_OTHER): Payer: PPO | Admitting: Internal Medicine

## 2020-07-19 VITALS — BP 130/84 | HR 54 | Ht 69.29 in | Wt 178.2 lb

## 2020-07-19 DIAGNOSIS — E785 Hyperlipidemia, unspecified: Secondary | ICD-10-CM | POA: Diagnosis not present

## 2020-07-19 DIAGNOSIS — Z0000 Encounter for general adult medical examination without abnormal findings: Secondary | ICD-10-CM | POA: Diagnosis not present

## 2020-07-19 DIAGNOSIS — K2270 Barrett's esophagus without dysplasia: Secondary | ICD-10-CM

## 2020-07-19 DIAGNOSIS — F5104 Psychophysiologic insomnia: Secondary | ICD-10-CM | POA: Diagnosis not present

## 2020-07-19 DIAGNOSIS — Z8673 Personal history of transient ischemic attack (TIA), and cerebral infarction without residual deficits: Secondary | ICD-10-CM | POA: Diagnosis not present

## 2020-07-19 LAB — PSA TOTAL+% FREE (SERIAL)
PSA, Free Pct: 20 %
PSA, Free: 1 ng/mL
Prostate Specific Ag, Serum: 5 ng/mL — ABNORMAL HIGH (ref 0.0–4.0)

## 2020-07-19 LAB — SARS-COV-2 SEMI-QUANTITATIVE TOTAL ANTIBODY, SPIKE
SARS-CoV-2 Semi-Quant Total Ab: 19.1 U/mL (ref <0.8)
SARS-CoV-2 Spike Ab Interp: POSITIVE

## 2020-07-19 MED ORDER — RABEPRAZOLE SODIUM 20 MG PO TBEC: 20.0000 mg | DELAYED_RELEASE_TABLET | Freq: Every day | ORAL | 3 refills | Status: DC

## 2020-07-19 MED ORDER — PRAVASTATIN SODIUM 10 MG PO TABS: 10.0000 mg | ORAL_TABLET | Freq: Every day | ORAL | 3 refills | Status: DC

## 2020-07-19 NOTE — Telephone Encounter (Signed)
-----   Message from Hollice Espy, MD sent at 07/19/2020  8:15 AM EDT ----- Please let this patient know that I received his PSA by his PCP.  It stable from last repeat still markedly elevated.  Lets keep his follow-up in July and repeat his PSA just prior to this as scheduled.  Hollice Espy, MD

## 2020-07-19 NOTE — Patient Instructions (Addendum)
Stress relax tranquil sleep amazon or whole foods  Or melatonin 5 mg or L theanine 100-200 mg at night  Sleep time tea/chamamile tea   Zolpidem Tablets What is this medicine? ZOLPIDEM (zole PI dem) is used to treat insomnia. This medicine helps you to fall asleep and sleep through the night. This medicine may be used for other purposes; ask your health care provider or pharmacist if you have questions. COMMON BRAND NAME(S): Ambien What should I tell my health care provider before I take this medicine? They need to know if you have any of these conditions:  depression  history of drug abuse or addiction  if you often drink alcohol  liver disease  lung or breathing disease  myasthenia gravis  sleep apnea  sleep-walking, driving, eating or other activity while not fully awake after taking a sleep medicine  suicidal thoughts, plans, or attempt; a previous suicide attempt by you or a family member  an unusual or allergic reaction to zolpidem, other medicines, foods, dyes, or preservatives  pregnant or trying to get pregnant  breast-feeding How should I use this medicine? Take this medicine by mouth with a glass of water. Follow the directions on the prescription label. It is better to take this medicine on an empty stomach and only when you are ready for bed. Do not take your medicine more often than directed. If you have been taking this medicine for several weeks and suddenly stop taking it, you may get unpleasant withdrawal symptoms. Your doctor or health care professional may want to gradually reduce the dose. Do not stop taking this medicine on your own. Always follow your doctor or health care professional's advice. A special MedGuide will be given to you by the pharmacist with each prescription and refill. Be sure to read this information carefully each time. Talk to your pediatrician regarding the use of this medicine in children. Special care may be needed. Overdosage: If  you think you have taken too much of this medicine contact a poison control center or emergency room at once. NOTE: This medicine is only for you. Do not share this medicine with others. What if I miss a dose? This does not apply. This medication should only be taken immediately before going to sleep. Do not take double or extra doses. What may interact with this medicine?  alcohol  antihistamines for allergy, cough and cold  certain medicines for anxiety or sleep  certain medicines for depression, like amitriptyline, fluoxetine, sertraline  certain medicines for fungal infections like ketoconazole and itraconazole  certain medicines for seizures like phenobarbital, primidone  ciprofloxacin  dietary supplements for sleep, like valerian or kava kava  general anesthetics like halothane, isoflurane, methoxyflurane, propofol  local anesthetics like lidocaine, pramoxine, tetracaine  medicines that relax muscles for surgery  narcotic medicines for pain  phenothiazines like chlorpromazine, mesoridazine, prochlorperazine, thioridazine  rifampin This list may not describe all possible interactions. Give your health care provider a list of all the medicines, herbs, non-prescription drugs, or dietary supplements you use. Also tell them if you smoke, drink alcohol, or use illegal drugs. Some items may interact with your medicine. What should I watch for while using this medicine? Visit your doctor or health care professional for regular checks on your progress. Keep a regular sleep schedule by going to bed at about the same time each night. Avoid caffeine-containing drinks in the evening hours. When sleep medicines are used every night for more than a few weeks, they may stop working.  Talk to your doctor if you still have trouble sleeping. After taking this medicine, you may get up out of bed and do an activity that you do not know you are doing. The next morning, you may have no memory of  this. Activities include driving a car ("sleep-driving"), making and eating food, talking on the phone, sexual activity, and sleep-walking. Serious injuries have occurred. Stop the medicine and call your doctor right away if you find out you have done any of these activities. Do not take this medicine if you have used alcohol that evening. Do not take it if you have taken another medicine for sleep. The risk of doing these sleep-related activities is higher. Wait for at least 8 hours after you take a dose before driving or doing other activities that require full mental alertness. Do not take this medicine unless you are able to stay in bed for a full night (7 to 8 hours) before you must be active again. You may have a decrease in mental alertness the day after use, even if you feel that you are fully awake. Tell your doctor if you will need to perform activities requiring full alertness, such as driving, the next day. Do not stand or sit up quickly after taking this medicine, especially if you are an older patient. This reduces the risk of dizzy or fainting spells. If you or your family notice any changes in your behavior, such as new or worsening depression, thoughts of harming yourself, anxiety, other unusual or disturbing thoughts, or memory loss, call your doctor right away. After you stop taking this medicine, you may have trouble falling asleep. This is called rebound insomnia. This problem usually goes away on its own after 1 or 2 nights. What side effects may I notice from receiving this medicine? Side effects that you should report to your doctor or health care professional as soon as possible:  allergic reactions like skin rash, itching or hives, swelling of the face, lips, or tongue  breathing problems  changes in vision  confusion  depressed mood or other changes in moods or emotions  feeling faint or lightheaded, falls  hallucinations  loss of balance or coordination  loss of  memory  numbness or tingling of the tongue  restlessness, excitability, or feelings of anxiety or agitation  signs and symptoms of liver injury like dark yellow or brown urine; general ill feeling or flu-like symptoms; light-colored stools; loss of appetite; nausea; right upper belly pain; unusually weak or tired; yellowing of the eyes or skin  suicidal thoughts  unusual activities while not fully awake like driving, eating, making phone calls, or sexual activity Side effects that usually do not require medical attention (report to your doctor or health care professional if they continue or are bothersome):  dizziness  drowsiness the day after you take this medicine  headache This list may not describe all possible side effects. Call your doctor for medical advice about side effects. You may report side effects to FDA at 1-800-FDA-1088. Where should I keep my medicine? Keep out of the reach of children. This medicine can be abused. Keep your medicine in a safe place to protect it from theft. Do not share this medicine with anyone. Selling or giving away this medicine is dangerous and against the law. This medicine may cause accidental overdose and death if taken by other adults, children, or pets. Mix any unused medicine with a substance like cat litter or coffee grounds. Then throw the medicine  away in a sealed container like a sealed bag or a coffee can with a lid. Do not use the medicine after the expiration date. Store at room temperature between 20 and 25 degrees C (68 and 77 degrees F). NOTE: This sheet is a summary. It may not cover all possible information. If you have questions about this medicine, talk to your doctor, pharmacist, or health care provider.  2021 Elsevier/Gold Standard (2020-03-09 09:54:34)  Eszopiclone tablets What is this medicine? ESZOPICLONE (es ZOE pi clone) is used to treat insomnia. This medicine helps you to fall asleep and sleep through the night. This  medicine may be used for other purposes; ask your health care provider or pharmacist if you have questions. COMMON BRAND NAME(S): Lunesta What should I tell my health care provider before I take this medicine? They need to know if you have any of these conditions:  depression  history of a drug or alcohol abuse problem  liver disease  lung or breathing disease  sleep-walking, driving, eating or other activity while not fully awake after taking a sleep medicine  suicidal thoughts  an unusual or allergic reaction to eszopiclone, other medicines, foods, dyes, or preservatives  pregnant or trying to get pregnant  breast-feeding How should I use this medicine? Take this medicine by mouth with a glass of water. Follow the directions on the prescription label. It is better to take this medicine on an empty stomach and only when you are ready for bed. Do not take your medicine more often than directed. If you have been taking this medicine for several weeks and suddenly stop taking it, you may get unpleasant withdrawal symptoms. Your doctor or health care professional may want to gradually reduce the dose. Do not stop taking this medicine on your own. Always follow your doctor or health care professional's advice. Talk to your pediatrician regarding the use of this medicine in children. Special care may be needed. Overdosage: If you think you have taken too much of this medicine contact a poison control center or emergency room at once. NOTE: This medicine is only for you. Do not share this medicine with others. What if I miss a dose? This does not apply. This medicine should only be taken immediately before going to sleep. Do not take double or extra doses. What may interact with this medicine?  herbal medicines like kava kava, melatonin, [place]'s wort and valerian  lorazepam  medicines for fungal infections like ketoconazole, fluconazole, or itraconazole  olanzapine This list may  not describe all possible interactions. Give your health care provider a list of all the medicines, herbs, non-prescription drugs, or dietary supplements you use. Also tell them if you smoke, drink alcohol, or use illegal drugs. Some items may interact with your medicine. What should I watch for while using this medicine? Visit your doctor or health care professional for regular checks on your progress. Keep a regular sleep schedule by going to bed at about the same time nightly. Avoid caffeine-containing drinks in the evening hours, as caffeine can cause trouble with falling asleep. Talk to your doctor if you still have trouble sleeping. After taking this medicine, you may get up out of bed and do an activity that you do not know you are doing. The next morning, you may have no memory of this. Activities include driving a car ("sleep-driving"), making and eating food, talking on the phone, sexual activity, and sleep-walking. Serious injuries have occurred. Stop the medicine and call your doctor  right away if you find out you have done any of these activities. Do not take this medicine if you have used alcohol that evening. Do not take it if you have taken another medicine for sleep. The risk of doing these sleep-related activities is higher. Do not take this medicine unless you are able to stay in bed for a full night (7 to 8 hours) before you must be active again. You may have a decrease in mental alertness the day after use, even if you feel that you are fully awake. Tell your doctor if you will need to perform activities requiring full alertness, such as driving, the next day. Do not stand or sit up quickly after taking this medicine, especially if you are an older patient. This reduces the risk of dizzy or fainting spells. If you or your family notice any changes in your behavior, such as new or worsening depression, thoughts of harming yourself, anxiety, other unusual or disturbing thoughts, or memory  loss, call your doctor right away. After you stop taking this medicine, you may have trouble falling asleep. This is called rebound insomnia. This problem usually goes away on its own after 1 or 2 nights. What side effects may I notice from receiving this medicine? Side effects that you should report to your doctor or health care professional as soon as possible:  allergic reactions like skin rash, itching or hives, swelling of the face, lips, or tongue  changes in vision  confusion  depressed mood  feeling faint or lightheaded, falls  hallucinations  problems with balance, speaking, walking  restlessness, excitability, or feelings of agitation  unusual activities while not fully awake like driving, eating, making phone calls Side effects that usually do not require medical attention (report to your doctor or health care professional if they continue or are bothersome):  dizziness, or daytime drowsiness, sometimes called a hangover effect  headache This list may not describe all possible side effects. Call your doctor for medical advice about side effects. You may report side effects to FDA at 1-800-FDA-1088. Where should I keep my medicine? Keep out of the reach of children. This medicine can be abused. Keep your medicine in a safe place to protect it from theft. Do not share this medicine with anyone. Selling or giving away this medicine is dangerous and against the law. This medicine may cause accidental overdose and death if taken by other adults, children, or pets. Mix any unused medicine with a substance like cat litter or coffee grounds. Then throw the medicine away in a sealed container like a sealed bag or a coffee can with a lid. Do not use the medicine after the expiration date. Store at room temperature between 15 and 30 degrees C (59 and 86 degrees F). NOTE: This sheet is a summary. It may not cover all possible information. If you have questions about this medicine, talk  to your doctor, pharmacist, or health care provider.  2021 Elsevier/Gold Standard (2017-09-11 11:57:05)  Insomnia Insomnia is a sleep disorder that makes it difficult to fall asleep or stay asleep. Insomnia can cause fatigue, low energy, difficulty concentrating, mood swings, and poor performance at work or school. There are three different ways to classify insomnia:  Difficulty falling asleep.  Difficulty staying asleep.  Waking up too early in the morning. Any type of insomnia can be long-term (chronic) or short-term (acute). Both are common. Short-term insomnia usually lasts for three months or less. Chronic insomnia occurs at least three times  a week for longer than three months. What are the causes? Insomnia may be caused by another condition, situation, or substance, such as:  Anxiety.  Certain medicines.  Gastroesophageal reflux disease (GERD) or other gastrointestinal conditions.  Asthma or other breathing conditions.  Restless legs syndrome, sleep apnea, or other sleep disorders.  Chronic pain.  Menopause.  Stroke.  Abuse of alcohol, tobacco, or illegal drugs.  Mental health conditions, such as depression.  Caffeine.  Neurological disorders, such as Alzheimer's disease.  An overactive thyroid (hyperthyroidism). Sometimes, the cause of insomnia may not be known. What increases the risk? Risk factors for insomnia include:  Gender. Women are affected more often than men.  Age. Insomnia is more common as you get older.  Stress.  Lack of exercise.  Irregular work schedule or working night shifts.  Traveling between different time zones.  Certain medical and mental health conditions. What are the signs or symptoms? If you have insomnia, the main symptom is having trouble falling asleep or having trouble staying asleep. This may lead to other symptoms, such as:  Feeling fatigued or having low energy.  Feeling nervous about going to sleep.  Not  feeling rested in the morning.  Having trouble concentrating.  Feeling irritable, anxious, or depressed. How is this diagnosed? This condition may be diagnosed based on:  Your symptoms and medical history. Your health care provider may ask about: ? Your sleep habits. ? Any medical conditions you have. ? Your mental health.  A physical exam. How is this treated? Treatment for insomnia depends on the cause. Treatment may focus on treating an underlying condition that is causing insomnia. Treatment may also include:  Medicines to help you sleep.  Counseling or therapy.  Lifestyle adjustments to help you sleep better. Follow these instructions at home: Eating and drinking  Limit or avoid alcohol, caffeinated beverages, and cigarettes, especially close to bedtime. These can disrupt your sleep.  Do not eat a large meal or eat spicy foods right before bedtime. This can lead to digestive discomfort that can make it hard for you to sleep.   Sleep habits  Keep a sleep diary to help you and your health care provider figure out what could be causing your insomnia. Write down: ? When you sleep. ? When you wake up during the night. ? How well you sleep. ? How rested you feel the next day. ? Any side effects of medicines you are taking. ? What you eat and drink.  Make your bedroom a dark, comfortable place where it is easy to fall asleep. ? Put up shades or blackout curtains to block light from outside. ? Use a white noise machine to block noise. ? Keep the temperature cool.  Limit screen use before bedtime. This includes: ? Watching TV. ? Using your smartphone, tablet, or computer.  Stick to a routine that includes going to bed and waking up at the same times every day and night. This can help you fall asleep faster. Consider making a quiet activity, such as reading, part of your nighttime routine.  Try to avoid taking naps during the day so that you sleep better at night.  Get  out of bed if you are still awake after 15 minutes of trying to sleep. Keep the lights down, but try reading or doing a quiet activity. When you feel sleepy, go back to bed.   General instructions  Take over-the-counter and prescription medicines only as told by your health care provider.  Exercise regularly,  as told by your health care provider. Avoid exercise starting several hours before bedtime.  Use relaxation techniques to manage stress. Ask your health care provider to suggest some techniques that may work well for you. These may include: ? Breathing exercises. ? Routines to release muscle tension. ? Visualizing peaceful scenes.  Make sure that you drive carefully. Avoid driving if you feel very sleepy.  Keep all follow-up visits as told by your health care provider. This is important. Contact a health care provider if:  You are tired throughout the day.  You have trouble in your daily routine due to sleepiness.  You continue to have sleep problems, or your sleep problems get worse. Get help right away if:  You have serious thoughts about hurting yourself or someone else. If you ever feel like you may hurt yourself or others, or have thoughts about taking your own life, get help right away. You can go to your nearest emergency department or call:  Your local emergency services (911 in the [place]  A suicide crisis helpline, such as the [facility] at (440)838-8758. This is open 24 hours a day. Summary  Insomnia is a sleep disorder that makes it difficult to fall asleep or stay asleep.  Insomnia can be long-term (chronic) or short-term (acute).  Treatment for insomnia depends on the cause. Treatment may focus on treating an underlying condition that is causing insomnia.  Keep a sleep diary to help you and your health care provider figure out what could be causing your insomnia. This information is not intended to replace advice given to you by  your health care provider. Make sure you discuss any questions you have with your health care provider. Document Revised: 01/26/2020 Document Reviewed: 01/26/2020 Elsevier Patient Education  2021 [employer].  Pneumococcal Polysaccharide Vaccine (PPSV23): What You Need to Know 1. Why get vaccinated? Pneumococcal polysaccharide vaccine (PPSV23) can prevent pneumococcal disease. Pneumococcal disease refers to any illness caused by pneumococcal bacteria. These bacteria can cause many types of illnesses, including pneumonia, which is an infection of the lungs. Pneumococcal bacteria are one of the most common causes of pneumonia. Besides pneumonia, pneumococcal bacteria can also cause:  Ear infections  Sinus infections  Meningitis (infection of the tissue covering the brain and spinal cord)  Bacteremia (bloodstream infection) Anyone can get pneumococcal disease, but children under 30 years of age, people with certain medical conditions, adults 33 years or older, and cigarette smokers are at the highest risk. Most pneumococcal infections are mild. However, some can result in long-term problems, such as brain damage or hearing loss. Meningitis, bacteremia, and pneumonia caused by pneumococcal disease can be fatal. 2. PPSV23 PPSV23 protects against 23 types of bacteria that cause pneumococcal disease. PPSV23 is recommended for:  All adults 106 years or older,  Anyone 2 years or older with certain medical conditions that can lead to an increased risk for pneumococcal disease. Most people need only one dose of PPSV23. A second dose of PPSV23, and another type of pneumococcal vaccine called PCV13, are recommended for certain high-risk groups. Your health care provider can give you more information. People 65 years or older should get a dose of PPSV23 even if they have already gotten one or more doses of the vaccine before they turned 67. 3. Talk with your health care provider Tell your vaccine  provider if the person getting the vaccine:  Has had an allergic reaction after a previous dose of PPSV23, or has any severe, life-threatening allergies.  In some cases, your health care provider may decide to postpone PPSV23 vaccination to a future visit. People with minor illnesses, such as a cold, may be vaccinated. People who are moderately or severely ill should usually wait until they recover before getting PPSV23. Your health care provider can give you more information. 4. Risks of a vaccine reaction  Redness or pain where the shot is given, feeling tired, fever, or muscle aches can happen after PPSV23. People sometimes faint after medical procedures, including vaccination. Tell your provider if you feel dizzy or have vision changes or ringing in the ears. As with any medicine, there is a very remote chance of a vaccine causing a severe allergic reaction, other serious injury, or death. 5. What if there is a serious problem? An allergic reaction could occur after the vaccinated person leaves the clinic. If you see signs of a severe allergic reaction (hives, swelling of the face and throat, difficulty breathing, a fast heartbeat, dizziness, or weakness), call 9-1-1 and get the person to the nearest hospital. For other signs that concern you, call your health care provider. Adverse reactions should be reported to the Vaccine Adverse Event Reporting System (VAERS). Your health care provider will usually file this report, or you can do it yourself. Visit the VAERS website at www.vaers.SamedayNews.es or call 250-533-5563. VAERS is only for reporting reactions, and VAERS staff do not give medical advice. 6. How can I learn more?  Ask your health care provider.  Call your local or state health department.  Contact the Centers for Disease Control and Prevention (CDC): ? Call 512-209-6215 (1-800-CDC-INFO) or ? Visit CDC's website at http://hunter.com/ Vaccine Information Statement PPSV23 Vaccine  (01/27/2018) This information is not intended to replace advice given to you by your health care provider. Make sure you discuss any questions you have with your health care provider. Document Revised: 11/18/2019 Document Reviewed: 11/18/2019 Elsevier Patient Education  2021 [place].  High Cholesterol  High cholesterol is a condition in which the blood has high levels of a white, waxy substance similar to fat (cholesterol). The liver makes all the cholesterol that the body needs. The human body needs small amounts of cholesterol to help build cells. A person gets extra or excess cholesterol from the food that he or she eats. The blood carries cholesterol from the liver to the rest of the body. If you have high cholesterol, deposits (plaques) may build up on the walls of your arteries. Arteries are the blood vessels that carry blood away from your heart. These plaques make the arteries narrow and stiff. Cholesterol plaques increase your risk for heart attack and stroke. Work with your health care provider to keep your cholesterol levels in a healthy range. What increases the risk? The following factors may make you more likely to develop this condition:  Eating foods that are high in animal fat (saturated fat) or cholesterol.  Being overweight.  Not getting enough exercise.  A family history of high cholesterol (familial hypercholesterolemia).  Use of tobacco products.  Having diabetes. What are the signs or symptoms? There are no symptoms of this condition. How is this diagnosed? This condition may be diagnosed based on the results of a blood test.  If you are older than 70 years of age, your health care provider may check your cholesterol levels every 4-6 years.  You may be checked more often if you have high cholesterol or other risk factors for heart disease. The blood test for cholesterol measures:  "Bad"  cholesterol, or LDL cholesterol. This is the main type of  cholesterol that causes heart disease. The desired level is less than 100 mg/dL.  "Good" cholesterol, or HDL cholesterol. HDL helps protect against heart disease by cleaning the arteries and carrying the LDL to the liver for processing. The desired level for HDL is 60 mg/dL or higher.  Triglycerides. These are fats that your body can store or burn for energy. The desired level is less than 150 mg/dL.  Total cholesterol. This measures the total amount of cholesterol in your blood and includes LDL, HDL, and triglycerides. The desired level is less than 200 mg/dL. How is this treated? This condition may be treated with:  Diet changes. You may be asked to eat foods that have more fiber and less saturated fats or added sugar.  Lifestyle changes. These may include regular exercise, maintaining a healthy weight, and quitting use of tobacco products.  Medicines. These are given when diet and lifestyle changes have not worked. You may be prescribed a statin medicine to help lower your cholesterol levels. Follow these instructions at home: Eating and drinking  Eat a healthy, balanced diet. This diet includes: ? Daily servings of a variety of fresh, frozen, or canned fruits and vegetables. ? Daily servings of whole grain foods that are rich in fiber. ? Foods that are low in saturated fats and trans fats. These include poultry and fish without skin, lean cuts of meat, and low-fat dairy products. ? A variety of fish, especially oily fish that contain omega-3 fatty acids. Aim to eat fish at least 2 times a week.  Avoid foods and drinks that have added sugar.  Use healthy cooking methods, such as roasting, grilling, broiling, baking, poaching, steaming, and stir-frying. Do not fry your food except for stir-frying.   Lifestyle  Get regular exercise. Aim to exercise for a total of 150 minutes a week. Increase your activity level by doing activities such as gardening, walking, and taking the stairs.  Do  not use any products that contain nicotine or tobacco, such as cigarettes, e-cigarettes, and chewing tobacco. If you need help quitting, ask your health care provider.   General instructions  Take over-the-counter and prescription medicines only as told by your health care provider.  Keep all follow-up visits as told by your health care provider. This is important. Where to find more information  American Heart Association: www.heart.org  National Heart, Lung, and Blood Institute: https://wilson-eaton.com/ Contact a health care provider if:  You have trouble achieving or maintaining a healthy diet or weight.  You are starting an exercise program.  You are unable to stop smoking. Get help right away if:  You have chest pain.  You have trouble breathing.  You have any symptoms of a stroke. "BE FAST" is an easy way to remember the main warning signs of a stroke: ? B - Balance. Signs are dizziness, sudden trouble walking, or loss of balance. ? E - Eyes. Signs are trouble seeing or a sudden change in vision. ? F - Face. Signs are sudden weakness or numbness of the face, or the face or eyelid drooping on one side. ? A - Arms. Signs are weakness or numbness in an arm. This happens suddenly and usually on one side of the body. ? S - Speech. Signs are sudden trouble speaking, slurred speech, or trouble understanding what people say. ? T - Time. Time to call emergency services. Write down what time symptoms started.  You have other signs  of a stroke, such as: ? A sudden, severe headache with no known cause. ? Nausea or vomiting. ? Seizure. These symptoms may represent a serious problem that is an emergency. Do not wait to see if the symptoms will go away. Get medical help right away. Call your local emergency services (911 in the [place] Do not drive yourself to the hospital. Summary  Cholesterol plaques increase your risk for heart attack and stroke. Work with your health care provider to keep  your cholesterol levels in a healthy range.  Eat a healthy, balanced diet, get regular exercise, and maintain a healthy weight.  Do not use any products that contain nicotine or tobacco, such as cigarettes, e-cigarettes, and chewing tobacco.  Get help right away if you have any symptoms of a stroke. This information is not intended to replace advice given to you by your health care provider. Make sure you discuss any questions you have with your health care provider. Document Revised: 02/14/2019 Document Reviewed: 02/14/2019 Elsevier Patient Education  2021 [place].  Cholesterol Content in Foods Cholesterol is a waxy, fat-like substance that helps to carry fat in the blood. The body needs cholesterol in small amounts, but too much cholesterol can cause damage to the arteries and heart. Most people should eat less than 200 milligrams (mg) of cholesterol a day. Foods with cholesterol Cholesterol is found in animal-based foods, such as meat, seafood, and dairy. Generally, low-fat dairy and lean meats have less cholesterol than full-fat dairy and fatty meats. The milligrams of cholesterol per serving (mg per serving) of common cholesterol-containing foods are listed below. Meat and other proteins  Egg -- one large whole egg has 186 mg.  Veal shank -- 4 oz has 141 mg.  Lean ground [place] (93% lean) -- 4 oz has 118 mg.  Fat-trimmed lamb loin -- 4 oz has 106 mg.  Lean ground beef (90% lean) -- 4 oz has 100 mg.  Lobster -- 3.5 oz has 90 mg.  Pork loin chops -- 4 oz has 86 mg.  Canned salmon -- 3.5 oz has 83 mg.  Fat-trimmed beef top loin -- 4 oz has 78 mg.  Frankfurter -- 1 frank (3.5 oz) has 77 mg.  Crab -- 3.5 oz has 71 mg.  Roasted chicken without skin, white meat -- 4 oz has 66 mg.  Light bologna -- 2 oz has 45 mg.  Deli-cut [place] -- 2 oz has 31 mg.  Canned tuna -- 3.5 oz has 31 mg.  Berniece Salines -- 1 oz has 29 mg.  Oysters and mussels (raw) -- 3.5 oz has 25  mg.  Mackerel -- 1 oz has 22 mg.  Trout -- 1 oz has 20 mg.  Pork sausage -- 1 link (1 oz) has 17 mg.  Salmon -- 1 oz has 16 mg.  Tilapia -- 1 oz has 14 mg. Dairy  Soft-serve ice cream --  cup (4 oz) has 103 mg.  Whole-milk yogurt -- 1 cup (8 oz) has 29 mg.  Cheddar cheese -- 1 oz has 28 mg.  American cheese -- 1 oz has 28 mg.  Whole milk -- 1 cup (8 oz) has 23 mg.  2% milk -- 1 cup (8 oz) has 18 mg.  Cream cheese -- 1 tablespoon (Tbsp) has 15 mg.  Cottage cheese --  cup (4 oz) has 14 mg.  Low-fat (1%) milk -- 1 cup (8 oz) has 10 mg.  Sour cream -- 1 Tbsp has 8.5 mg.  Low-fat yogurt --  1 cup (8 oz) has 8 mg.  Nonfat Greek yogurt -- 1 cup (8 oz) has 7 mg.  Half-and-half cream -- 1 Tbsp has 5 mg. Fats and oils  Cod liver oil -- 1 tablespoon (Tbsp) has 82 mg.  Butter -- 1 Tbsp has 15 mg.  Lard -- 1 Tbsp has 14 mg.  Bacon grease -- 1 Tbsp has 14 mg.  Mayonnaise -- 1 Tbsp has 5-10 mg.  Margarine -- 1 Tbsp has 3-10 mg. Exact amounts of cholesterol in these foods may vary depending on specific ingredients and brands.   Foods without cholesterol Most plant-based foods do not have cholesterol unless you combine them with a food that has cholesterol. Foods without cholesterol include:  Grains and cereals.  Vegetables.  Fruits.  Vegetable oils, such as olive, canola, and sunflower oil.  Legumes, such as peas, beans, and lentils.  Nuts and seeds.  Egg whites.   Summary  The body needs cholesterol in small amounts, but too much cholesterol can cause damage to the arteries and heart.  Most people should eat less than 200 milligrams (mg) of cholesterol a day. This information is not intended to replace advice given to you by your health care provider. Make sure you discuss any questions you have with your health care provider. Document Revised: 08/08/2019 Document Reviewed: 08/08/2019 Elsevier Patient Education  2021 [place].  Pravastatin  Tablets What is this medicine? PRAVASTATIN (PRA va stat in) is known as a HMG-CoA reductase inhibitor or 'statin'. It lowers the level of cholesterol and triglycerides in the blood. This drug may also reduce the risk of heart attack, stroke, or other health problems in patients with risk factors for heart disease. Diet and lifestyle changes are often used with this drug. This medicine may be used for other purposes; ask your health care provider or pharmacist if you have questions. COMMON BRAND NAME(S): Pravachol What should I tell my health care provider before I take this medicine? They need to know if you have any of these conditions:  diabetes  if you often drink alcohol  history of stroke  kidney disease  liver disease  muscle aches or weakness  thyroid disease  an unusual or allergic reaction to pravastatin, other medicines, foods, dyes, or preservatives  pregnant or trying to get pregnant  breast-feeding How should I use this medicine? Take pravastatin tablets by mouth. Swallow the tablets with a drink of water. Pravastatin can be taken at anytime of the day, with or without food. Follow the directions on the prescription label. Take your doses at regular intervals. Do not take your medicine more often than directed. Talk to your pediatrician regarding the use of this medicine in children. Special care may be needed. Pravastatin has been used in children as young as 39 years of age. Overdosage: If you think you have taken too much of this medicine contact a poison control center or emergency room at once. NOTE: This medicine is only for you. Do not share this medicine with others. What if I miss a dose? If you miss a dose, take it as soon as you can. If it is almost time for your next dose, take only that dose. Do not take double or extra doses. What may interact with this medicine? This medicine may interact with the following  medications:  colchicine  cyclosporine  other medicines for high cholesterol  some antibiotics like azithromycin, clarithromycin, erythromycin, and telithromycin This list may not describe all possible interactions. Give your  health care provider a list of all the medicines, herbs, non-prescription drugs, or dietary supplements you use. Also tell them if you smoke, drink alcohol, or use illegal drugs. Some items may interact with your medicine. What should I watch for while using this medicine? Visit your doctor or health care professional for regular check-ups. You may need regular tests to make sure your liver is working properly. Your health care professional may tell you to stop taking this medicine if you develop muscle problems. If your muscle problems do not go away after stopping this medicine, contact your health care professional. Do not become pregnant while taking this medicine. Women should inform their health care professional if they wish to become pregnant or think they might be pregnant. There is a potential for serious side effects to an unborn child. Talk to your health care professional or pharmacist for more information. Do not breast-feed an infant while taking this medicine. This medicine may affect blood sugar levels. If you have diabetes, check with your doctor or health care professional before you change your diet or the dose of your diabetic medicine. If you are going to need surgery or other procedure, tell your doctor that you are using this medicine. This drug is only part of a total heart-health program. Your doctor or a dietician can suggest a low-cholesterol and low-fat diet to help. Avoid alcohol and smoking, and keep a proper exercise schedule. This medicine may cause a decrease in Co-Enzyme Q-10. You should make sure that you get enough Co-Enzyme Q-10 while you are taking this medicine. Discuss the foods you eat and the vitamins you take with your health care  professional. What side effects may I notice from receiving this medicine? Side effects that you should report to your doctor or health care professional as soon as possible:  allergic reactions like skin rash, itching or hives, swelling of the face, lips, or tongue  dark urine  fever  muscle pain, cramps, or weakness  redness, blistering, peeling or loosening of the skin, including inside the mouth  trouble passing urine or change in the amount of urine  unusually weak or tired  yellowing of the eyes or skin Side effects that usually do not require medical attention (report to your doctor or health care professional if they continue or are bothersome):  gas  headache  heartburn  indigestion  stomach pain This list may not describe all possible side effects. Call your doctor for medical advice about side effects. You may report side effects to FDA at 1-800-FDA-1088. Where should I keep my medicine? Keep out of the reach of children. Store at room temperature between 15 to 30 degrees C (59 to 86 degrees F). Protect from light. Keep container tightly closed. Throw away any unused medicine after the expiration date. NOTE: This sheet is a summary. It may not cover all possible information. If you have questions about this medicine, talk to your doctor, pharmacist, or health care provider.  2021 Elsevier/Gold Standard (2020-01-29 09:58:03)

## 2020-07-19 NOTE — Progress Notes (Signed)
Chief Complaint  Patient presents with  . Annual Exam   Annual with wife  1. Declines covid 19 vaccine and Abs + thinks had in 03/2020  2. hld disc statin he is agreeable to try with h/o prior stroke on brain imaging for prevention recurrent 3. GERD wants refill of aciphex 20 mg qd  4. C/o chronic insomnia tried otc melatonin w/o relief disc lunesta he and wife will think about this    Review of Systems  Constitutional: Positive for weight loss.       Down 7 lbs   HENT: Negative for hearing loss.   Eyes: Negative for blurred vision.  Respiratory: Negative for shortness of breath.   Cardiovascular: Negative for chest pain.  Gastrointestinal: Positive for heartburn. Negative for abdominal pain.  Musculoskeletal: Negative for falls and joint pain.  Skin: Negative for rash.  Neurological: Negative for headaches.  Psychiatric/Behavioral: The patient has insomnia.    Past Medical History:  Diagnosis Date  . 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 COVID-19    03/2020  . Depression    remote h/o suicide ideation   . GERD (gastroesophageal reflux disease)   . Hypertension   . Sleep apnea    Past Surgical History:  Procedure Laterality Date  . COLONOSCOPY WITH PROPOFOL N/A 04/01/2018   Procedure: COLONOSCOPY WITH PROPOFOL;  Surgeon: Lollie Sails, MD;  Location: [facility] ENDOSCOPY;  Service: Endoscopy;  Laterality: N/A;  . ESOPHAGOGASTRODUODENOSCOPY N/A 04/01/2018   Procedure: ESOPHAGOGASTRODUODENOSCOPY (EGD);  Surgeon: Lollie Sails, MD;  Location: [facility] ENDOSCOPY;  Service: Endoscopy;  Laterality: N/A;  . [place]  . VASECTOMY     Family History  Problem Relation Age of Onset  . Stroke Father   . Heart attack Father        h/o cig smoker   . Prostate cancer Father        ?  Marland Kitchen Heart disease Father   . Other Father        smoker  . Dementia Mother   . Stroke Mother        2s  . Heart attack Paternal  Grandmother    Social History   Socioeconomic History  . Marital status: Married    Spouse name: Judie Grieve  . Number of children: 2  . Years of education: 2  . Highest education level: Associate degree: academic program  Occupational History  . Occupation: retired  Tobacco Use  . Smoking status: Never Smoker  . Smokeless tobacco: Never Used  Vaping Use  . Vaping Use: Never used  Substance and Sexual Activity  . Alcohol use: Yes    Alcohol/week: 0.0 standard drinks  . Drug use: No  . Sexual activity: Yes    Partners: Female  Other Topics Concern  . 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 Difficulty of Paying Living Expenses: Not hard at all  Food Insecurity: No Food Insecurity  . Worried About Charity fundraiser in the Last Year: Never true  . Ran Out of Food in the Last Year: Never true  Transportation Needs: No Transportation Needs  . Lack of Transportation (Medical): No  .  Lack of Transportation (Non-Medical): No  Physical Activity: Sufficiently Active  . Days of Exercise per Week: 3 days  . Minutes of Exercise per Session: 60 min  Stress: No Stress Concern Present  . Feeling of Stress : Not at all  Social Connections: Socially Integrated  . Frequency of Communication with Friends and Family: Three times a week  . Frequency of Social Gatherings with Friends and Family: Three times a week  . Attends Religious Services: More than 4 times per year  . Active Member of Clubs or Organizations: Yes  . Attends Archivist Meetings: More than 4 times per year  . Marital Status: Married  Human resources officer Violence: Not At Risk  . Fear of Current or Ex-Partner: No  . Emotionally Abused: No  . Physically Abused: No  . Sexually Abused: No   Current Meds  Medication Sig   . aspirin EC 81 MG tablet Take 81 mg by mouth daily. Swallow whole.  . pravastatin (PRAVACHOL) 10 MG tablet Take 1 tablet (10 mg total) by mouth daily. After 6 pm  . tamsulosin (FLOMAX) 0.4 MG CAPS capsule Take 1 capsule (0.4 mg total) by mouth daily.  . [DISCONTINUED] RABEprazole (ACIPHEX) 20 MG tablet Take 1 tablet (20 mg total) by mouth daily. 30 min before breakfast or dinner   Allergies  Allergen Reactions  . Omeprazole Diarrhea  . Statins     Fatigue,muscle aches    Recent Results (from the past 2160 hour(s))  SARS-CoV-2 Semi-Quantitative Total Antibody, Spike     Status: None   Collection Time: 07/17/20  8:40 AM  Result Value Ref Range   SARS-CoV-2 Semi-Quant Total Ab 19.1 Negative<0.8 U/mL   SARS-CoV-2 Spike Ab Interp Positive     Comment: Antibodies against the SARS-CoV-2 spike protein receptor binding domain (RBD) were detected. It is yet undetermined what level of antibody to SARS-CoV-2 spike protein correlates to immunity against developing symptomatic SARS-CoV-2 disease. Studies are underway to measure the quantitative levels of specific SARS-CoV-2 antibodies following vaccination. Such studies will provide valuable insights into the correlation between protection from vaccination and antibody levels. Roche Elecsys Anti-SARS-CoV-2 S Effective June 25, 2020 [facility] expanded the reporting range**   of results for test 164090 SARS-Cov-2 Semi-Quantitative Total   Antibody, Spike. Results previously reported for this assay   were 0.8 - 2500 U/mL with higher values reported as >2500 U/mL.   With the addition of an automated dilution, we are now able to   report results 0.8 - 25000 U/mL with higher values reported as   >25000 U/mL. This change does not impact previou sly reported   results, it just increases the numerical values above 2500 U/mL   that we are able to report.   PSA Total+%Free (Serial)     Status: Abnormal   Collection Time: 07/17/20  8:40 AM  Result  Value Ref Range   Prostate Specific Ag, Serum 5.0 (H) 0.0 - 4.0 ng/mL    Comment: Roche ECLIA methodology. According to the American Urological Association, Serum PSA should decrease and remain at undetectable levels after radical prostatectomy. The AUA defines biochemical recurrence as an initial PSA value 0.2 ng/mL or greater followed by a subsequent confirmatory PSA value 0.2 ng/mL or greater. Values obtained with different assay methods or kits cannot be used interchangeably. Results cannot be interpreted as absolute evidence of the presence or absence of malignant disease.    PSA, Free 1.00 N/A ng/mL    Comment: Roche ECLIA methodology.   PSA,  Free Pct 20.0 %    Comment: The table below lists the probability of prostate cancer for men with non-suspicious DRE results and total PSA between 4 and 10 ng/mL, by patient age Ricci Barker, [place], [id number]).                   % Free PSA       50-64 yr        65-75 yr                   0.00-10.00%        56%             55%                  10.01-15.00%        24%             35%                  15.01-20.00%        17%             23%                  20.01-25.00%        10%             20%                       >25.00%         5%              9% Please note:  Catalona et al did not make specific               recommendations regarding the use of               percent free PSA for any other population               of men.   Urinalysis, Routine w reflex microscopic     Status: None   Collection Time: 07/17/20  8:40 AM  Result Value Ref Range   Specific Gravity, UA 1.016 1.005 - 1.030   pH, UA 6.0 5.0 - 7.5   Color, UA Yellow Yellow   Appearance Ur Clear Clear   Leukocytes,UA Negative Negative   Protein,UA Negative Negative/Trace   Glucose, UA Negative Negative   Ketones, UA Negative Negative   RBC, UA Negative Negative   Bilirubin, UA Negative Negative   Urobilinogen, Ur 0.2 0.2 - 1.0 mg/dL   Nitrite, UA Negative  Negative   Microscopic Examination Comment     Comment: Microscopic not indicated and not performed.  TSH     Status: None   Collection Time: 07/17/20  8:40 AM  Result Value Ref Range   TSH 4.41 0.35 - 4.50 uIU/mL  CBC w/Diff     Status: None   Collection Time: 07/17/20  8:40 AM  Result Value Ref Range   WBC 4.5 4.0 - 10.5 K/uL   RBC 4.71 4.22 - 5.81 Mil/uL   Hemoglobin 14.8 13.0 - 17.0 g/dL   HCT 43.0 39.0 - 52.0 %   MCV 91.5 78.0 - 100.0 fl   MCHC 34.5 30.0 - 36.0 g/dL   RDW 13.3 11.5 - 15.5 %   Platelets 228.0 150.0 - 400.0 K/uL   Neutrophils Relative % 63.0 43.0 - 77.0 %   Lymphocytes Relative 26.7 12.0 -  46.0 %   Monocytes Relative 7.8 3.0 - 12.0 %   Eosinophils Relative 1.8 0.0 - 5.0 %   Basophils Relative 0.7 0.0 - 3.0 %   Neutro Abs 2.8 1.4 - 7.7 K/uL   Lymphs Abs 1.2 0.7 - 4.0 K/uL   Monocytes Absolute 0.3 0.1 - 1.0 K/uL   Eosinophils Absolute 0.1 0.0 - 0.7 K/uL   Basophils Absolute 0.0 0.0 - 0.1 K/uL  Lipid panel     Status: Abnormal   Collection Time: 07/17/20  8:40 AM  Result Value Ref Range   Cholesterol 187 0 - 200 mg/dL    Comment: ATP III Classification       Desirable:  < 200 mg/dL               Borderline High:  200 - 239 mg/dL          High:  > = 240 mg/dL   Triglycerides 130.0 0.0 - 149.0 mg/dL    Comment: Normal:  <150 mg/dLBorderline High:  150 - 199 mg/dL   HDL 42.40 >39.00 mg/dL   VLDL 26.0 0.0 - 40.0 mg/dL   LDL Cholesterol 118 (H) 0 - 99 mg/dL   Total CHOL/HDL Ratio 4     Comment:                Men          Women1/2 Average Risk     3.4          3.3Average Risk          5.0          4.42X Average Risk          9.6          7.13X Average Risk          15.0          11.0                       NonHDL 144.11     Comment: NOTE:  Non-HDL goal should be 30 mg/dL higher than patient's LDL goal (i.e. LDL goal of < 70 mg/dL, would have non-HDL goal of < 100 mg/dL)  Comprehensive metabolic panel     Status: None   Collection Time: 07/17/20  8:40 AM  Result  Value Ref Range   Sodium 138 135 - 145 mEq/L   Potassium 4.5 3.5 - 5.1 mEq/L   Chloride 103 96 - 112 mEq/L   CO2 29 19 - 32 mEq/L   Glucose, Bld 91 70 - 99 mg/dL   BUN 17 6 - 23 mg/dL   Creatinine, Ser 1.04 0.40 - 1.50 mg/dL   Total Bilirubin 0.8 0.2 - 1.2 mg/dL   Alkaline Phosphatase 73 39 - 117 U/L   AST 15 0 - 37 U/L   ALT 11 0 - 53 U/L   Total Protein 6.6 6.0 - 8.3 g/dL   Albumin 4.0 3.5 - 5.2 g/dL   GFR 73.30 >60.00 mL/min    Comment: Calculated using the CKD-EPI Creatinine Equation (2021)   Calcium 9.3 8.4 - 10.5 mg/dL   Objective  Body mass index is 26.09 kg/m. Wt Readings from Last 3 Encounters:  07/19/20 178 lb 3.2 oz (80.8 kg)  06/18/20 185 lb (83.9 kg)  10/04/19 185 lb (83.9 kg)   Temp Readings from Last 3 Encounters:  06/09/19 98.2 F (36.8 C) (Temporal)  08/24/18 98 F (36.7 C) (Oral)  04/01/18 (!) 97  F (36.1 C) (Tympanic)   BP Readings from Last 3 Encounters:  07/19/20 130/84  10/04/19 (!) 168/91  06/28/19 136/88   Pulse Readings from Last 3 Encounters:  07/19/20 (!) 54  10/04/19 (!) 59  06/28/19 70    Physical Exam Vitals and nursing note reviewed.  Constitutional:      Appearance: Normal appearance. He is well-developed, well-groomed and overweight.  HENT:     Head: Normocephalic and atraumatic.  Cardiovascular:     Rate and Rhythm: Regular rhythm. Bradycardia present.     Heart sounds: Normal heart sounds.  Pulmonary:     Effort: Pulmonary effort is normal.     Breath sounds: Normal breath sounds.  Abdominal:     Tenderness: There is no abdominal tenderness.  Skin:    General: Skin is warm and dry.  Neurological:     General: No focal deficit present.     Mental Status: He is alert and oriented to person, place, and time. Mental status is at baseline.     Gait: Gait normal.  Psychiatric:        Attention and Perception: Attention and perception normal.        Mood and Affect: Mood and affect normal.        Speech: Speech normal.         Behavior: Behavior normal. Behavior is cooperative.        Thought Content: Thought content normal.        Cognition and Memory: Cognition and memory normal.        Judgment: Judgment normal.     Assessment  Plan  Annual physical exam flu shothigh dose not had 2021 utd prevnar consider pna 23  tdap utd hadand shingrix 2/2 2nd utd  covid vx declines  PSA with h/o BPH 5.0 07/17/20 f/u urology -will need year f/u urology Dr. Erlene Quan appt 10/09/20  EGD 04/01/18 + barretts will need f/u in 3 years -will CC [facility] Dr. Gustavo Lah to see if pt should be on PPI prev. Omeprazole caused diarrhea Prior response rec aciphex/rabeprazole or protonix more tolerated as omeprazole/lasoprazole had diarrhea   Colonoscopy1/2/20 serrated polyp negative pathology [facility]  Skinas of 07/16/20 week saw derm f/u yearly  ->dry disc cetaphil/cerave  [facility] [place] no changes x 2 yrs 07/20/20   Cards Dr. Rockey Situ consider in future last seen 01/2018 no current issues as of 03/20/19 or 06/09/19 but has persistent w/o sxs bradycardia  rec healthy diet and exercise   Hyperlipidemia, with h/o Stroke on imaging - Plan: pravastatin (PRAVACHOL) 10 MG tablet new qhs  Barrett's esophagus without dysplasia - Plan: RABEprazole (ACIPHEX) 20 MG tablet [facility] in future   Chronic insomnia Disc ambien lunesta and otc L theanine 100-200 mg qhs prn and magesium for sleep     Provider: Dr. Olivia Mackie McLean-Scocuzza-Internal Medicine

## 2020-07-19 NOTE — Telephone Encounter (Signed)
Pt aware and verbalized understanding.  

## 2020-07-23 ENCOUNTER — Encounter: Payer: Self-pay | Admitting: Internal Medicine

## 2020-07-23 DIAGNOSIS — Z0000 Encounter for general adult medical examination without abnormal findings: Secondary | ICD-10-CM | POA: Insufficient documentation

## 2020-08-28 ENCOUNTER — Other Ambulatory Visit: Payer: Self-pay | Admitting: Urology

## 2020-10-02 ENCOUNTER — Other Ambulatory Visit: Payer: Self-pay

## 2020-10-02 DIAGNOSIS — R972 Elevated prostate specific antigen [PSA]: Secondary | ICD-10-CM

## 2020-10-03 ENCOUNTER — Other Ambulatory Visit: Payer: Self-pay

## 2020-10-03 ENCOUNTER — Other Ambulatory Visit: Payer: PPO

## 2020-10-03 DIAGNOSIS — R972 Elevated prostate specific antigen [PSA]: Secondary | ICD-10-CM | POA: Diagnosis not present

## 2020-10-04 DIAGNOSIS — H2513 Age-related nuclear cataract, bilateral: Secondary | ICD-10-CM | POA: Diagnosis not present

## 2020-10-04 DIAGNOSIS — H31003 Unspecified chorioretinal scars, bilateral: Secondary | ICD-10-CM | POA: Diagnosis not present

## 2020-10-04 DIAGNOSIS — R519 Headache, unspecified: Secondary | ICD-10-CM | POA: Diagnosis not present

## 2020-10-04 DIAGNOSIS — H02886 Meibomian gland dysfunction of left eye, unspecified eyelid: Secondary | ICD-10-CM | POA: Diagnosis not present

## 2020-10-04 DIAGNOSIS — H02889 Meibomian gland dysfunction of unspecified eye, unspecified eyelid: Secondary | ICD-10-CM | POA: Diagnosis not present

## 2020-10-04 LAB — PSA: Prostate Specific Ag, Serum: 5.1 ng/mL — ABNORMAL HIGH (ref 0.0–4.0)

## 2020-10-09 ENCOUNTER — Other Ambulatory Visit: Payer: Self-pay

## 2020-10-09 ENCOUNTER — Encounter: Payer: Self-pay | Admitting: Urology

## 2020-10-09 ENCOUNTER — Ambulatory Visit: Payer: PPO | Admitting: Urology

## 2020-10-09 VITALS — BP 200/120 | HR 59 | Ht 69.29 in | Wt 178.0 lb

## 2020-10-09 DIAGNOSIS — R351 Nocturia: Secondary | ICD-10-CM | POA: Diagnosis not present

## 2020-10-09 DIAGNOSIS — R972 Elevated prostate specific antigen [PSA]: Secondary | ICD-10-CM | POA: Diagnosis not present

## 2020-10-09 DIAGNOSIS — N401 Enlarged prostate with lower urinary tract symptoms: Secondary | ICD-10-CM | POA: Diagnosis not present

## 2020-10-09 NOTE — Progress Notes (Signed)
10/09/2020 1:32 PM   [name] [name] 08-03-1950 [id number]  Referring provider: McLean-Scocuzza, Nino Glow, MD [place],  [place] [address]  Chief Complaint  Patient presents with   Elevated PSA    HPI: 70 year old male with a personal history of BPH and elevated PSA returns for routine annual follow-up.  Today, reports his urinary symptoms are under excellent control.  He is on Flomax only.  He reports that he used to get up several times at night to urinate after 3 AM but this has since subsided.  No issues with Flomax.  IPSS as below.  In terms of PSA, PSA trend as below.  He does have a family history of prostate cancer as previously outlined.  He reports that in the past, he has seen his PSA trend downwards when he hydrates.  This last time, he reports that he was dehydrated.   PSA Trend: 05/22/2016: 2.5.   05/12/2017: 3.9  06/16/2017: 4.2 09/29/2017: 3.8 09/20/2018: 3.5  10/04/2019: 5.0 10/03/2020 5.1    IPSS     Row Name 10/09/20 1300         International Prostate Symptom Score   How often have you had the sensation of not emptying your bladder? Less than 1 in 5     How often have you had to urinate less than every two hours? Less than 1 in 5 times     How often have you found you stopped and started again several times when you urinated? Not at All     How often have you found it difficult to postpone urination? Less than 1 in 5 times     How often have you had a weak urinary stream? Less than 1 in 5 times     How often have you had to strain to start urination? Not at All     How many times did you typically get up at night to urinate? 1 Time     Total IPSS Score 5           Quality of Life due to urinary symptoms     If you were to spend the rest of your life with your urinary condition just the way it is now how would you feel about that? 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 and some scarring in left eye but not blind    COVID-19    03/2020   Depression    remote h/o suicide ideation    GERD (gastroesophageal reflux disease)    Hypertension    Sleep apnea     Surgical History: Past Surgical History:  Procedure Laterality Date   COLONOSCOPY WITH PROPOFOL N/A 04/01/2018   Procedure: COLONOSCOPY WITH PROPOFOL;  Surgeon: Lollie Sails, MD;  [facility] ENDOSCOPY;  Service: Endoscopy;  Laterality: N/A;   ESOPHAGOGASTRODUODENOSCOPY N/A 04/01/2018   Procedure: ESOPHAGOGASTRODUODENOSCOPY (EGD);  Surgeon: Lollie Sails, MD;  Location: [facility] ENDOSCOPY;  Service: Endoscopy;  Laterality: N/A;   HEMORROIDECTOMY  1980   VASECTOMY      Home Medications:  Allergies as of 10/09/2020       Reactions   Omeprazole Diarrhea   Statins    Fatigue,muscle aches        Medication List  Accurate as of October 09, 2020  1:32 PM. If you have any questions, ask your nurse or doctor.          STOP taking these medications    aspirin EC 81 MG tablet Stopped by: Hollice Espy, MD   b complex vitamins tablet Stopped by: Hollice Espy, MD   cholecalciferol 10 MCG (400 UNIT) Tabs tablet Commonly known as: VITAMIN D3 Stopped by: Hollice Espy, MD   CITRUS BERGAMOT PO Stopped by: Hollice Espy, MD   GARLIC OIL PO Stopped by: Hollice Espy, MD       TAKE these medications    Hawthorn Berries 565 MG Caps Take 1 capsule by mouth daily.   pravastatin 10 MG tablet Commonly known as: PRAVACHOL Take 1 tablet (10 mg total) by mouth daily. After 6 pm   RABEprazole 20 MG tablet Commonly known as: ACIPHEX Take 1 tablet (20 mg total) by mouth daily. 30 min before breakfast or dinner   tamsulosin 0.4 MG Caps capsule Commonly known as: FLOMAX TAKE 1 CAPSULE BY MOUTH ONCE DAILY   vitamin C 500 MG tablet Commonly known as: ASCORBIC ACID Take 500 mg by mouth daily.         Allergies:  Allergies  Allergen Reactions   Omeprazole Diarrhea   Statins     Fatigue,muscle aches     Family History: Family History  Problem Relation Age of Onset   Stroke Father    Heart attack Father        h/o cig smoker    Prostate cancer Father        ?   Heart disease Father    Other Father        smoker   Dementia Mother    Stroke Mother        32s   Heart attack Paternal Grandmother     Social History:  reports that he has never smoked. He has never used smokeless tobacco. He reports current alcohol use. He reports that he does not use drugs.   Physical Exam: BP (!) 200/120   Pulse (!) 59   Ht 5' 9.29" (1.76 m)   Wt 178 lb (80.7 kg)   BMI 26.07 kg/m   Constitutional:  Alert and oriented, No acute distress. HEENT:  AT, moist mucus membranes.  Trachea midline, no masses. Cardiovascular: No clubbing, cyanosis, or edema. Respiratory: Normal respiratory effort, no increased work of breathing. Rectal: Normal sphincter tone.  External hemorrhoids appreciated large rubbery prostate, at least 50 cc with rubbery lateral lobes, no nodules. Skin: No rashes, bruises or suspicious lesions. Neurologic: Grossly intact, no focal deficits, moving all 4 extremities. Psychiatric: Normal mood and affect.  Laboratory Data: As above   Assessment & Plan:    1. Elevated PSA Lengthy discussion today about elevated PSA, overall trend is upwards but has been in the past year  He does have significant prostamegaly and exam which is somewhat reassuring  We discussed options of prostate biopsy versus MRI versus any flow surveillance.  At this time, he like to avoid biopsy if at all possible especially given stability.  He may be interested in prostate MRI but would prefer to just have his PSA rechecked in 6 months and then consider further diagnostic intervention thereafter depending on results.  Will plan for PSA only in 6 months with closely thereafter. - PSA;  Future  2. Benign prostatic hyperplasia with nocturia Symptoms stable/well-controlled on Flomax, will continue this medication  Follow-up in 6 months for  PSA only, 1 year PSA/DRE/IPSS/PVR  Hollice Espy, MD  [place] [address] [place], [place] [address] [phone]

## 2021-01-18 ENCOUNTER — Other Ambulatory Visit: Payer: Self-pay

## 2021-01-18 ENCOUNTER — Encounter: Payer: Self-pay | Admitting: Internal Medicine

## 2021-01-18 ENCOUNTER — Ambulatory Visit (INDEPENDENT_AMBULATORY_CARE_PROVIDER_SITE_OTHER): Payer: PPO | Admitting: Internal Medicine

## 2021-01-18 VITALS — BP 210/100 | HR 83 | Temp 97.8°F | Ht 69.29 in | Wt 178.4 lb

## 2021-01-18 DIAGNOSIS — E785 Hyperlipidemia, unspecified: Secondary | ICD-10-CM | POA: Diagnosis not present

## 2021-01-18 DIAGNOSIS — R972 Elevated prostate specific antigen [PSA]: Secondary | ICD-10-CM

## 2021-01-18 DIAGNOSIS — R030 Elevated blood-pressure reading, without diagnosis of hypertension: Secondary | ICD-10-CM

## 2021-01-18 DIAGNOSIS — I10 Essential (primary) hypertension: Secondary | ICD-10-CM | POA: Diagnosis not present

## 2021-01-18 MED ORDER — AMLODIPINE BESYLATE 5 MG PO TABS
5.0000 mg | ORAL_TABLET | Freq: Every day | ORAL | 3 refills | Status: DC
Start: 2021-01-18 — End: 2021-02-28

## 2021-01-18 NOTE — Patient Instructions (Addendum)
Goal <130/<80 blood Restart norvasc 5 daily in the am for blood pressure    Consider pneumonia 23 vaccine   Amlodipine Tablets What is this medication? AMLODIPINE (am LOE di peen) treats high blood pressure and prevents chest pain (angina). It works by relaxing the blood vessels, which helps decrease the amount of work your heart has to do. It belongs to a group of medications called calcium channel blockers. This medicine may be used for other purposes; ask your health care provider or pharmacist if you have questions. COMMON BRAND NAME(S): Norvasc What should I tell my care team before I take this medication? They need to know if you have any of these conditions: Heart disease Liver disease An unusual or allergic reaction to amlodipine, other medications, foods, dyes, or preservatives Pregnant or trying to get pregnant Breast-feeding How should I use this medication? Take this medication by mouth. Take it as directed on the prescription label at the same time every day. You can take it with or without food. If it upsets your stomach, take it with food. Keep taking it unless your care team tells you to stop. Talk to your care team about the use of this medication in children. While it may be prescribed for children as young as 6 for selected conditions, precautions do apply. Overdosage: If you think you have taken too much of this medicine contact a poison control center or emergency room at once. NOTE: This medicine is only for you. Do not share this medicine with others. What if I miss a dose? If you miss a dose, take it as soon as you can. If it is almost time for your next dose, take only that dose. Do not take double or extra doses. What may interact with this medication? Clarithromycin Cyclosporine Diltiazem Itraconazole Simvastatin Tacrolimus This list may not describe all possible interactions. Give your health care provider a list of all the medicines, herbs,  non-prescription drugs, or dietary supplements you use. Also tell them if you smoke, drink alcohol, or use illegal drugs. Some items may interact with your medicine. What should I watch for while using this medication? Visit your health care provider for regular checks on your progress. Check your blood pressure as directed. Ask your health care provider what your blood pressure should be. Also, find out when you should contact him or her. Do not treat yourself for coughs, colds, or pain while you are using this medication without asking your health care provider for advice. Some medications may increase your blood pressure. You may get drowsy or dizzy. Do not drive, use machinery, or do anything that needs mental alertness until you know how this medication affects you. Do not stand up or sit up quickly, especially if you are an older patient. This reduces the risk of dizzy or fainting spells. Alcohol can make you more drowsy and dizzy. Avoid alcoholic drinks. What side effects may I notice from receiving this medication? Side effects that you should report to your care team as soon as possible: Allergic reactions-skin rash, itching, hives, swelling of the face, lips, tongue, or throat Heart attack-pain or tightness in the chest, shoulders, arms, or jaw, nausea, shortness of breath, cold or clammy skin, feeling faint or lightheaded Low blood pressure-dizziness, feeling faint or lightheaded, blurry vision Side effects that usually do not require medical attention (report these to your care team if they continue or are bothersome): Facial flushing, redness Heart palpitations-rapid, pounding, or irregular heartbeat Nausea Stomach pain Swelling of  the ankles, hands, or feet This list may not describe all possible side effects. Call your doctor for medical advice about side effects. You may report side effects to FDA at 1-800-FDA-1088. Where should I keep my medication? Keep out of the reach of  children and pets. Store at room temperature between 20 and 25 degrees C (68 and 77 degrees F). Protect from light and moisture. Keep the container tightly closed. Get rid of any unused medication after the expiration date. To get rid of medications that are no longer needed or have expired: Take the medication to a medication take-back program. Check with your pharmacy or law enforcement to find a location. If you cannot return the medication, check the label or package insert to see if the medication should be thrown out in the garbage or flushed down the toilet. If you are not sure, ask your health care provider. If it is safe to put in the trash, empty the medication out of the container. Mix the medication with cat litter, dirt, coffee grounds, or other unwanted substance. Seal the mixture in a bag or container. Put it in the trash. NOTE: This sheet is a summary. It may not cover all possible information. If you have questions about this medicine, talk to your doctor, pharmacist, or health care provider.  2022 Elsevier/Gold Standard (2020-02-11 14:59:47)  Pneumococcal Polysaccharide Vaccine (PPSV23): What You Need to Know 1. Why get vaccinated? Pneumococcal polysaccharide vaccine (PPSV23) can prevent pneumococcal disease. Pneumococcal disease refers to any illness caused by pneumococcal bacteria. These bacteria can cause many types of illnesses, including pneumonia, which is an infection of the lungs. Pneumococcal bacteria are one of the most common causes of pneumonia. Besides pneumonia, pneumococcal bacteria can also cause: Ear infections Sinus infections Meningitis (infection of the tissue covering the brain and spinal cord) Bacteremia (bloodstream infection) Anyone can get pneumococcal disease, but children under 1 years of age, people with certain medical conditions, adults 16 years or older, and cigarette smokers are at the highest risk. Most pneumococcal infections are mild. However,  some can result in long-term problems, such as brain damage or hearing loss. Meningitis, bacteremia, and pneumonia caused by pneumococcal disease can be fatal. 2. PPSV23 PPSV23 protects against 23 types of bacteria that cause pneumococcal disease. PPSV23 is recommended for: All adults 72 years or older, Anyone 2 years or older with certain medical conditions that can lead to an increased risk for pneumococcal disease. Most people need only one dose of PPSV23. A second dose of PPSV23, and another type of pneumococcal vaccine called PCV13, are recommended for certain high-risk groups. Your health care provider can give you more information. People 65 years or older should get a dose of PPSV23 even if they have already gotten one or more doses of the vaccine before they turned 45. 3. Talk with your health care provider Tell your vaccine provider if the person getting the vaccine: Has had an allergic reaction after a previous dose of PPSV23, or has any severe, life-threatening allergies. In some cases, your health care provider may decide to postpone PPSV23 vaccination to a future visit. People with minor illnesses, such as a cold, may be vaccinated. People who are moderately or severely ill should usually wait until they recover before getting PPSV23. Your health care provider can give you more information. 4. Risks of a vaccine reaction Redness or pain where the shot is given, feeling tired, fever, or muscle aches can happen after PPSV23. People sometimes faint after medical procedures,  including vaccination. Tell your provider if you feel dizzy or have vision changes or ringing in the ears. As with any medicine, there is a very remote chance of a vaccine causing a severe allergic reaction, other serious injury, or death. 5. What if there is a serious problem? An allergic reaction could occur after the vaccinated person leaves the clinic. If you see signs of a severe allergic reaction (hives,  swelling of the face and throat, difficulty breathing, a fast heartbeat, dizziness, or weakness), call 9-1-1 and get the person to the nearest hospital. For other signs that concern you, call your health care provider. Adverse reactions should be reported to the Vaccine Adverse Event Reporting System (VAERS). Your health care provider will usually file this report, or you can do it yourself. Visit the VAERS website at www.vaers.SamedayNews.es or call (314)769-7208. VAERS is only for reporting reactions, and VAERS staff do not give medical advice. 6. How can I learn more? Ask your health care provider. Call your local or state health department. Contact the Centers for Disease Control and Prevention (CDC): Call 845-542-5508 (1-800-CDC-INFO) or Visit CDC's website at http://hunter.com/ Vaccine Information Statement PPSV23 Vaccine (01/27/2018) This information is not intended to replace advice given to you by your health care provider. Make sure you discuss any questions you have with your health care provider. Document Revised: 11/18/2019 Document Reviewed: 11/18/2019 Elsevier Patient Education  [place].  Hypertension, Adult High blood pressure (hypertension) is when the force of blood pumping through the arteries is too strong. The arteries are the blood vessels that carry blood from the heart throughout the body. Hypertension forces the heart to work harder to pump blood and may cause arteries to become narrow or stiff. Untreated or uncontrolled hypertension can cause a heart attack, heart failure, a stroke, kidney disease, and other problems. A blood pressure reading consists of a higher number over a lower number. Ideally, your blood pressure should be below 120/80. The first ("top") number is called the systolic pressure. It is a measure of the pressure in your arteries as your heart beats. The second ("bottom") number is called the diastolic pressure. It is a measure of the pressure in  your arteries as the heart relaxes. What are the causes? The exact cause of this condition is not known. There are some conditions that result in or are related to high blood pressure. What increases the risk? Some risk factors for high blood pressure are under your control. The following factors may make you more likely to develop this condition: Smoking. Having type 2 diabetes mellitus, high cholesterol, or both. Not getting enough exercise or physical activity. Being overweight. Having too much fat, sugar, calories, or salt (sodium) in your diet. Drinking too much alcohol. Some risk factors for high blood pressure may be difficult or impossible to change. Some of these factors include: Having chronic kidney disease. Having a family history of high blood pressure. Age. Risk increases with age. Race. You may be at higher risk if you are African American. Gender. Men are at higher risk than women before age 64. After age 52, women are at higher risk than men. Having obstructive sleep apnea. Stress. What are the signs or symptoms? High blood pressure may not cause symptoms. Very high blood pressure (hypertensive crisis) may cause: Headache. Anxiety. Shortness of breath. Nosebleed. Nausea and vomiting. Vision changes. Severe chest pain. Seizures. How is this diagnosed? This condition is diagnosed by measuring your blood pressure while you are seated, with your  arm resting on a flat surface, your legs uncrossed, and your feet flat on the floor. The cuff of the blood pressure monitor will be placed directly against the skin of your upper arm at the level of your heart. It should be measured at least twice using the same arm. Certain conditions can cause a difference in blood pressure between your right and left arms. Certain factors can cause blood pressure readings to be lower or higher than normal for a short period of time: When your blood pressure is higher when you are in a health  care provider's office than when you are at home, this is called white coat hypertension. Most people with this condition do not need medicines. When your blood pressure is higher at home than when you are in a health care provider's office, this is called masked hypertension. Most people with this condition may need medicines to control blood pressure. If you have a high blood pressure reading during one visit or you have normal blood pressure with other risk factors, you may be asked to: Return on a different day to have your blood pressure checked again. Monitor your blood pressure at home for 1 week or longer. If you are diagnosed with hypertension, you may have other blood or imaging tests to help your health care provider understand your overall risk for other conditions. How is this treated? This condition is treated by making healthy lifestyle changes, such as eating healthy foods, exercising more, and reducing your alcohol intake. Your health care provider may prescribe medicine if lifestyle changes are not enough to get your blood pressure under control, and if: Your systolic blood pressure is above 130. Your diastolic blood pressure is above 80. Your personal target blood pressure may vary depending on your medical conditions, your age, and other factors. Follow these instructions at home: Eating and drinking  Eat a diet that is high in fiber and potassium, and low in sodium, added sugar, and fat. An example eating plan is called the DASH (Dietary Approaches to Stop Hypertension) diet. To eat this way: Eat plenty of fresh fruits and vegetables. Try to fill one half of your plate at each meal with fruits and vegetables. Eat whole grains, such as whole-wheat pasta, brown rice, or whole-grain bread. Fill about one fourth of your plate with whole grains. Eat or drink low-fat dairy products, such as skim milk or low-fat yogurt. Avoid fatty cuts of meat, processed or cured meats, and poultry  with skin. Fill about one fourth of your plate with lean proteins, such as fish, chicken without skin, beans, eggs, or tofu. Avoid pre-made and processed foods. These tend to be higher in sodium, added sugar, and fat. Reduce your daily sodium intake. Most people with hypertension should eat less than 1,500 mg of sodium a day. Do not drink alcohol if: Your health care provider tells you not to drink. You are pregnant, may be pregnant, or are planning to become pregnant. If you drink alcohol: Limit how much you use to: 0-1 drink a day for women. 0-2 drinks a day for men. Be aware of how much alcohol is in your drink. In the [place] one drink equals one 12 oz bottle of beer (355 mL), one 5 oz glass of wine (148 mL), or one 1 oz glass of hard liquor (44 mL). Lifestyle  Work with your health care provider to maintain a healthy body weight or to lose weight. Ask what an ideal weight is for you. Get  at least 30 minutes of exercise most days of the week. Activities may include walking, swimming, or biking. Include exercise to strengthen your muscles (resistance exercise), such as Pilates or lifting weights, as part of your weekly exercise routine. Try to do these types of exercises for 30 minutes at least 3 days a week. Do not use any products that contain nicotine or tobacco, such as cigarettes, e-cigarettes, and chewing tobacco. If you need help quitting, ask your health care provider. Monitor your blood pressure at home as told by your health care provider. Keep all follow-up visits as told by your health care provider. This is important. Medicines Take over-the-counter and prescription medicines only as told by your health care provider. Follow directions carefully. Blood pressure medicines must be taken as prescribed. Do not skip doses of blood pressure medicine. Doing this puts you at risk for problems and can make the medicine less effective. Ask your health care provider about side effects or  reactions to medicines that you should watch for. Contact a health care provider if you: Think you are having a reaction to a medicine you are taking. Have headaches that keep coming back (recurring). Feel dizzy. Have swelling in your ankles. Have trouble with your vision. Get help right away if you: Develop a severe headache or confusion. Have unusual weakness or numbness. Feel faint. Have severe pain in your chest or abdomen. Vomit repeatedly. Have trouble breathing. Summary Hypertension is when the force of blood pumping through your arteries is too strong. If this condition is not controlled, it may put you at risk for serious complications. Your personal target blood pressure may vary depending on your medical conditions, your age, and other factors. For most people, a normal blood pressure is less than 120/80. Hypertension is treated with lifestyle changes, medicines, or a combination of both. Lifestyle changes include losing weight, eating a healthy, low-sodium diet, exercising more, and limiting alcohol. This information is not intended to replace advice given to you by your health care provider. Make sure you discuss any questions you have with your health care provider. Document Revised: 11/25/2017 Document Reviewed: 11/25/2017 Elsevier Patient Education  2022 [place] Eating Plan DASH stands for Dietary Approaches to Stop Hypertension. The DASH eating plan is a healthy eating plan that has been shown to: Reduce high blood pressure (hypertension). Reduce your risk for type 2 diabetes, heart disease, and stroke. Help with weight loss. What are tips for following this plan? Reading food labels Check food labels for the amount of salt (sodium) per serving. Choose foods with less than 5 percent of the Daily Value of sodium. Generally, foods with less than 300 milligrams (mg) of sodium per serving fit into this eating plan. To find whole grains, look for the word  "whole" as the first word in the ingredient list. Shopping Buy products labeled as "low-sodium" or "no salt added." Buy fresh foods. Avoid canned foods and pre-made or frozen meals. Cooking Avoid adding salt when cooking. Use salt-free seasonings or herbs instead of table salt or sea salt. Check with your health care provider or pharmacist before using salt substitutes. Do not fry foods. Cook foods using healthy methods such as baking, boiling, grilling, roasting, and broiling instead. Cook with heart-healthy oils, such as olive, canola, avocado, soybean, or sunflower oil. Meal planning  Eat a balanced diet that includes: 4 or more servings of fruits and 4 or more servings of vegetables each day. Try to fill one-half of your plate  with fruits and vegetables. 6-8 servings of whole grains each day. Less than 6 oz (170 g) of lean meat, poultry, or fish each day. A 3-oz (85-g) serving of meat is about the same size as a deck of cards. One egg equals 1 oz (28 g). 2-3 servings of low-fat dairy each day. One serving is 1 cup (237 mL). 1 serving of nuts, seeds, or beans 5 times each week. 2-3 servings of heart-healthy fats. Healthy fats called omega-3 fatty acids are found in foods such as walnuts, flaxseeds, fortified milks, and eggs. These fats are also found in cold-water fish, such as sardines, salmon, and mackerel. Limit how much you eat of: Canned or prepackaged foods. Food that is high in trans fat, such as some fried foods. Food that is high in saturated fat, such as fatty meat. Desserts and other sweets, sugary drinks, and other foods with added sugar. Full-fat dairy products. Do not salt foods before eating. Do not eat more than 4 egg yolks a week. Try to eat at least 2 vegetarian meals a week. Eat more home-cooked food and less restaurant, buffet, and fast food. Lifestyle When eating at a restaurant, ask that your food be prepared with less salt or no salt, if possible. If you drink  alcohol: Limit how much you use to: 0-1 drink a day for women who are not pregnant. 0-2 drinks a day for men. Be aware of how much alcohol is in your drink. In the [place] one drink equals one 12 oz bottle of beer (355 mL), one 5 oz glass of wine (148 mL), or one 1 oz glass of hard liquor (44 mL). General information Avoid eating more than 2,300 mg of salt a day. If you have hypertension, you may need to reduce your sodium intake to 1,500 mg a day. Work with your health care provider to maintain a healthy body weight or to lose weight. Ask what an ideal weight is for you. Get at least 30 minutes of exercise that causes your heart to beat faster (aerobic exercise) most days of the week. Activities may include walking, swimming, or biking. Work with your health care provider or dietitian to adjust your eating plan to your individual calorie needs. What foods should I eat? Fruits All fresh, dried, or frozen fruit. Canned fruit in natural juice (without added sugar). Vegetables Fresh or frozen vegetables (raw, steamed, roasted, or grilled). Low-sodium or reduced-sodium tomato and vegetable juice. Low-sodium or reduced-sodium tomato sauce and tomato paste. Low-sodium or reduced-sodium canned vegetables. Grains Whole-grain or whole-wheat bread. Whole-grain or whole-wheat pasta. Brown rice. [name]. Bulgur. Whole-grain and low-sodium cereals. Pita bread. Low-fat, low-sodium crackers. Whole-wheat flour tortillas. Meats and other proteins Skinless chicken or [place]. Ground chicken or [place]. Pork with fat trimmed off. Fish and seafood. Egg whites. Dried beans, peas, or lentils. Unsalted nuts, nut butters, and seeds. Unsalted canned beans. Lean cuts of beef with fat trimmed off. Low-sodium, lean precooked or cured meat, such as sausages or meat loaves. Dairy Low-fat (1%) or fat-free (skim) milk. Reduced-fat, low-fat, or fat-free cheeses. Nonfat, low-sodium ricotta or cottage cheese. Low-fat or  nonfat yogurt. Low-fat, low-sodium cheese. Fats and oils Soft margarine without trans fats. Vegetable oil. Reduced-fat, low-fat, or light mayonnaise and salad dressings (reduced-sodium). Canola, safflower, olive, avocado, soybean, and sunflower oils. Avocado. Seasonings and condiments Herbs. Spices. Seasoning mixes without salt. Other foods Unsalted popcorn and pretzels. Fat-free sweets. The items listed above may not be a complete list of foods and beverages  you can eat. Contact a dietitian for more information. What foods should I avoid? Fruits Canned fruit in a light or heavy syrup. Fried fruit. Fruit in cream or butter sauce. Vegetables Creamed or fried vegetables. Vegetables in a cheese sauce. Regular canned vegetables (not low-sodium or reduced-sodium). Regular canned tomato sauce and paste (not low-sodium or reduced-sodium). Regular tomato and vegetable juice (not low-sodium or reduced-sodium). Angie Fava. Olives. Grains Baked goods made with fat, such as croissants, muffins, or some breads. Dry pasta or rice meal packs. Meats and other proteins Fatty cuts of meat. Ribs. Fried meat. Berniece Salines. Bologna, salami, and other precooked or cured meats, such as sausages or meat loaves. Fat from the back of a pig (fatback). Bratwurst. Salted nuts and seeds. Canned beans with added salt. Canned or smoked fish. Whole eggs or egg yolks. Chicken or [place] with skin. Dairy Whole or 2% milk, cream, and half-and-half. Whole or full-fat cream cheese. Whole-fat or sweetened yogurt. Full-fat cheese. Nondairy creamers. Whipped toppings. Processed cheese and cheese spreads. Fats and oils Butter. Stick margarine. Lard. Shortening. Ghee. Bacon fat. Tropical oils, such as coconut, palm kernel, or palm oil. Seasonings and condiments Onion salt, garlic salt, seasoned salt, table salt, and sea salt. Worcestershire sauce. Tartar sauce. Barbecue sauce. Teriyaki sauce. Soy sauce, including reduced-sodium. Steak sauce.  Canned and packaged gravies. Fish sauce. Oyster sauce. Cocktail sauce. Store-bought horseradish. Ketchup. Mustard. Meat flavorings and tenderizers. Bouillon cubes. Hot sauces. Pre-made or packaged marinades. Pre-made or packaged taco seasonings. Relishes. Regular salad dressings. Other foods Salted popcorn and pretzels. The items listed above may not be a complete list of foods and beverages you should avoid. Contact a dietitian for more information. Where to find more information National Heart, Lung, and Blood Institute: https://wilson-eaton.com/ American Heart Association: www.heart.org Academy of Nutrition and Dietetics: www.eatright.[place]: www.kidney.org Summary The DASH eating plan is a healthy eating plan that has been shown to reduce high blood pressure (hypertension). It may also reduce your risk for type 2 diabetes, heart disease, and stroke. When on the DASH eating plan, aim to eat more fresh fruits and vegetables, whole grains, lean proteins, low-fat dairy, and heart-healthy fats. With the DASH eating plan, you should limit salt (sodium) intake to 2,300 mg a day. If you have hypertension, you may need to reduce your sodium intake to 1,500 mg a day. Work with your health care provider or dietitian to adjust your eating plan to your individual calorie needs. This information is not intended to replace advice given to you by your health care provider. Make sure you discuss any questions you have with your health care provider. Document Revised: 02/18/2019 Document Reviewed: 02/18/2019 Elsevier Patient Education  2022 [employer].

## 2021-01-18 NOTE — Progress Notes (Signed)
Patient blood pressure elevated as he was waiting in the lobby during a scene with another disorderly and aggressive Patient. Other Patient was having encounter with [facility]. Patient denies any symptoms of high blood pressure such as lightheadedness, headache, blurred vision, etc. Patient states he is fine just worked up.   Was 210/100 at 11:28 am then 178/94 at 11:37 am.

## 2021-01-28 NOTE — Progress Notes (Signed)
Chief Complaint  Patient presents with   Follow-up   F/u  1. Elevated BP with h/o HTN (w/o sx's) not on meds in the past on norvasc agreeable to go back on this medication  Nervous/anxious today as another patient in lobby was acting out and he was the only person in the lobby 2. Elevated PSA 5.1 f/u urology disc with pt consider prostate MRI if elevated with labs 03/2021    Review of Systems  Constitutional:  Negative for weight loss.  HENT:  Negative for hearing loss.   Eyes:  Negative for blurred vision.  Respiratory:  Negative for shortness of breath.   Cardiovascular:  Negative for chest pain.  Gastrointestinal:  Negative for abdominal pain.  Musculoskeletal:  Negative for falls and joint pain.  Skin:  Negative for rash.  Neurological:  Negative for headaches.  Psychiatric/Behavioral:  Negative for depression.   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 and some scarring in left eye but not blind    COVID-19    03/2020   Depression    remote h/o suicide ideation    GERD (gastroesophageal reflux disease)    Hypertension    Sleep apnea    Past Surgical History:  Procedure Laterality Date   COLONOSCOPY WITH PROPOFOL N/A 04/01/2018   Procedure: COLONOSCOPY WITH PROPOFOL;  Surgeon: Lollie Sails, MD;  Location: [facility] ENDOSCOPY;  Service: Endoscopy;  Laterality: N/A;   ESOPHAGOGASTRODUODENOSCOPY N/A 04/01/2018   Procedure: ESOPHAGOGASTRODUODENOSCOPY (EGD);  Surgeon: Lollie Sails, MD;  Location: [facility] ENDOSCOPY;  Service: Endoscopy;  Laterality: N/A;   HEMORROIDECTOMY  1980   VASECTOMY     Family History  Problem Relation Age of Onset   Stroke Father    Heart attack Father        h/o cig smoker    Prostate cancer Father        ?   Heart disease Father    Other Father        smoker   Dementia Mother    Stroke Mother        49s   Heart attack Paternal Grandmother    Social History   Socioeconomic History   Marital  status: Married    Spouse name: Theatre manager   Number of children: 2   Years of education: 12   Highest education level: Associate degree: academic program  Occupational History   Occupation: retired  Tobacco Use   Smoking status: Never   Smokeless tobacco: Never  Vaping Use   Vaping Use: Never used  Substance and Sexual Activity   Alcohol use: Yes    Alcohol/week: 0.0 standard drinks   Drug use: No   Sexual activity: Yes    Partners: Female  Other Topics Concern   Not on file  Social History Narrative   DPR wife Adeeb Konecny    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, radio    Never smoker    Wears eye glasses   Social Determinants of Radio broadcast assistant Strain: Low Risk    Difficulty of Paying Living Expenses: Not hard at all  Food Insecurity: No Food Insecurity   Worried About Charity fundraiser in the Last Year: Never true   Arboriculturist in the Last Year: Never true  Transportation Needs: No Transportation Needs   Lack of Transportation (Medical): No   Lack of Transportation (Non-Medical): No  Physical Activity:  Sufficiently Active   Days of Exercise per Week: 3 days   Minutes of Exercise per Session: 60 min  Stress: No Stress Concern Present   Feeling of Stress : Not at all  Social Connections: Socially Integrated   Frequency of Communication with Friends and Family: Three times a week   Frequency of Social Gatherings with Friends and Family: Three times a week   Attends Religious Services: More than 4 times per year   Active Member of Clubs or Organizations: Yes   Attends Music therapist: More than 4 times per year   Marital Status: Married  Human resources officer Violence: Not At Risk   Fear of Current or Ex-Partner: No   Emotionally Abused: No   Physically Abused: No   Sexually Abused: No   Current Meds  Medication Sig   amLODipine (NORVASC) 5 MG tablet Take 1 tablet (5 mg total) by mouth daily.   pravastatin (PRAVACHOL) 10 MG  tablet Take 1 tablet (10 mg total) by mouth daily. After 6 pm   RABEprazole (ACIPHEX) 20 MG tablet Take 1 tablet (20 mg total) by mouth daily. 30 min before breakfast or dinner   tamsulosin (FLOMAX) 0.4 MG CAPS capsule TAKE 1 CAPSULE BY MOUTH ONCE DAILY   Allergies  Allergen Reactions   Omeprazole Diarrhea   Statins     Fatigue,muscle aches    No results found for this or any previous visit (from the past 2160 hour(s)). Objective  Body mass index is 26.13 kg/m. Wt Readings from Last 3 Encounters:  01/18/21 178 lb 6.4 oz (80.9 kg)  10/09/20 178 lb (80.7 kg)  07/19/20 178 lb 3.2 oz (80.8 kg)   Temp Readings from Last 3 Encounters:  01/18/21 97.8 F (36.6 C) (Oral)  06/09/19 98.2 F (36.8 C) (Temporal)  08/24/18 98 F (36.7 C) (Oral)   BP Readings from Last 3 Encounters:  01/18/21 (!) 210/100  10/09/20 (!) 200/120  07/19/20 130/84   Pulse Readings from Last 3 Encounters:  01/18/21 83  10/09/20 (!) 59  07/19/20 (!) 54    Physical Exam Vitals and nursing note reviewed.  Constitutional:      Appearance: Normal appearance. He is well-developed and well-groomed.  HENT:     Head: Normocephalic and atraumatic.  Eyes:     Conjunctiva/sclera: Conjunctivae normal.     Pupils: Pupils are equal, round, and reactive to light.  Cardiovascular:     Rate and Rhythm: Normal rate and regular rhythm.     Heart sounds: Normal heart sounds. No murmur heard. Skin:    General: Skin is warm and dry.  Neurological:     General: No focal deficit present.     Mental Status: He is alert and oriented to person, place, and time. Mental status is at baseline.     Sensory: Sensation is intact.     Motor: Motor function is intact.     Coordination: Coordination is intact.     Gait: Gait is intact. Gait normal.  Psychiatric:        Attention and Perception: Attention and perception normal.        Mood and Affect: Mood and affect normal.        Speech: Speech normal.        Behavior:  Behavior normal. Behavior is cooperative.        Thought Content: Thought content normal.        Cognition and Memory: Cognition and memory normal.  Judgment: Judgment normal.    Assessment  Plan  Hypertension, uncontrolled goal <130/<80- Plan: amLODipine (NORVASC) 5 MG tablet resume was on this years ago not taking now Variable BP over years needs treatment   Elevated PSA 5.1 10/03/20  Lab visit urology 03/2021 disc with pt consider prostate mRI if elevated again   HM flu shot high dose not had 2022  utd prevnar consider pna 23  tdap utd 04/14/16 had and shingrix 2/2 2nd utd  covid vx declines   PSA with h/o BPH 5.0 07/17/20 f/u urology PSA 10/03/20 5.1  -->f/u urology Dr. Erlene Quan appt 03/2021 disc with pt consider MRI prostate to further w/u elevated PSA   EGD 04/01/18 + barretts will need f/u in 3 years -prev CC'ed [facility] Dr. Gustavo Lah to see if pt should be on PPI prev. Omeprazole caused diarrhea Prior response rec aciphex/rabeprazole or protonix more tolerated as omeprazole/lasoprazole had diarrhea    Colonoscopy 04/01/18 serrated polyp negative pathology [facility]    Skin as of 07/16/20 week saw derm f/u yearly  ->dry disc cetaphil/cerave   [facility] [place] no changes x 2 yrs 07/20/20    Cards Dr. Rockey Situ consider in future last seen 01/2018 no current issues as of 03/20/19 or 06/09/19 but has persistent w/o sxs bradycardia   rec healthy diet and exercise     Provider: Dr. Olivia Mackie McLean-Scocuzza-[facility]

## 2021-02-04 NOTE — Progress Notes (Signed)
Due to wife recently getting out of the hospital follow up rescheduled.   Fasting labs scheduled 11/23 and follow up 12/1.

## 2021-02-07 ENCOUNTER — Ambulatory Visit: Payer: PPO | Admitting: Internal Medicine

## 2021-02-20 ENCOUNTER — Other Ambulatory Visit (INDEPENDENT_AMBULATORY_CARE_PROVIDER_SITE_OTHER): Payer: PPO

## 2021-02-20 ENCOUNTER — Other Ambulatory Visit: Payer: Self-pay

## 2021-02-20 DIAGNOSIS — R972 Elevated prostate specific antigen [PSA]: Secondary | ICD-10-CM

## 2021-02-20 DIAGNOSIS — E785 Hyperlipidemia, unspecified: Secondary | ICD-10-CM | POA: Diagnosis not present

## 2021-02-20 DIAGNOSIS — I10 Essential (primary) hypertension: Secondary | ICD-10-CM

## 2021-02-20 LAB — COMPREHENSIVE METABOLIC PANEL
ALT: 13 U/L (ref 0–53)
AST: 19 U/L (ref 0–37)
Albumin: 4.4 g/dL (ref 3.5–5.2)
Alkaline Phosphatase: 68 U/L (ref 39–117)
BUN: 18 mg/dL (ref 6–23)
CO2: 30 mEq/L (ref 19–32)
Calcium: 9.4 mg/dL (ref 8.4–10.5)
Chloride: 104 mEq/L (ref 96–112)
Creatinine, Ser: 1.03 mg/dL (ref 0.40–1.50)
GFR: 73.84 mL/min (ref >60.00)
Glucose, Bld: 93 mg/dL (ref 70–99)
Potassium: 4.4 mEq/L (ref 3.5–5.1)
Sodium: 140 mEq/L (ref 135–145)
Total Bilirubin: 0.7 mg/dL (ref 0.2–1.2)
Total Protein: 7 g/dL (ref 6.0–8.3)

## 2021-02-20 LAB — LIPID PANEL
Cholesterol: 163 mg/dL (ref 0–200)
HDL: 43.6 mg/dL (ref >39.00)
LDL Cholesterol: 101 mg/dL — ABNORMAL HIGH (ref 0–99)
NonHDL: 119.62
Total CHOL/HDL Ratio: 4
Triglycerides: 92 mg/dL (ref 0.0–149.0)
VLDL: 18.4 mg/dL (ref 0.0–40.0)

## 2021-02-20 LAB — CBC WITH DIFFERENTIAL/PLATELET
Basophils Absolute: 0 10*3/uL (ref 0.0–0.1)
Basophils Relative: 0.5 % (ref 0.0–3.0)
Eosinophils Absolute: 0.1 10*3/uL (ref 0.0–0.7)
Eosinophils Relative: 1.6 % (ref 0.0–5.0)
HCT: 43.5 % (ref 39.0–52.0)
Hemoglobin: 15.1 g/dL (ref 13.0–17.0)
Lymphocytes Relative: 30.7 % (ref 12.0–46.0)
Lymphs Abs: 1.3 10*3/uL (ref 0.7–4.0)
MCHC: 34.7 g/dL (ref 30.0–36.0)
MCV: 93.9 fl (ref 78.0–100.0)
Monocytes Absolute: 0.3 10*3/uL (ref 0.1–1.0)
Monocytes Relative: 7 % (ref 3.0–12.0)
Neutro Abs: 2.5 10*3/uL (ref 1.4–7.7)
Neutrophils Relative %: 60.2 % (ref 43.0–77.0)
Platelets: 219 10*3/uL (ref 150.0–400.0)
RBC: 4.64 Mil/uL (ref 4.22–5.81)
RDW: 12.8 % (ref 11.5–15.5)
WBC: 4.2 10*3/uL (ref 4.0–10.5)

## 2021-02-25 NOTE — Progress Notes (Signed)
Cholesterol improved continue pravastatin cholesterol medication + healthy diet and exercise Liver kidneys and blood cts normal

## 2021-02-28 ENCOUNTER — Ambulatory Visit (INDEPENDENT_AMBULATORY_CARE_PROVIDER_SITE_OTHER): Payer: PPO | Admitting: Internal Medicine

## 2021-02-28 ENCOUNTER — Other Ambulatory Visit: Payer: Self-pay

## 2021-02-28 ENCOUNTER — Encounter: Payer: Self-pay | Admitting: Internal Medicine

## 2021-02-28 VITALS — BP 146/90 | HR 61 | Temp 97.1°F | Ht 69.29 in | Wt 178.0 lb

## 2021-02-28 DIAGNOSIS — I10 Essential (primary) hypertension: Secondary | ICD-10-CM | POA: Diagnosis not present

## 2021-02-28 DIAGNOSIS — E785 Hyperlipidemia, unspecified: Secondary | ICD-10-CM | POA: Diagnosis not present

## 2021-02-28 MED ORDER — PRAVASTATIN SODIUM 20 MG PO TABS: 20.0000 mg | ORAL_TABLET | Freq: Every day | ORAL | 3 refills | Status: DC

## 2021-02-28 MED ORDER — AMLODIPINE BESYLATE 10 MG PO TABS: 10.0000 mg | ORAL_TABLET | Freq: Every day | ORAL | 3 refills | Status: DC

## 2021-02-28 NOTE — Patient Instructions (Addendum)
If PSA still elevated 04/11/21 I rec MRI prostate   Pravachol cholesterol medication take 2-10 mg =20 mg or 1 20 mg pill  Goal Blood pressure <130/<80  Increase norvasc 5 mg to 10 mg log blood pressure and let me know in 2 weeks  Call back with list of BP readings or send my chart message   Cholesterol Content in Foods Cholesterol is a waxy, fat-like substance that helps to carry fat in the blood. The body needs cholesterol in small amounts, but too much cholesterol can cause damage to the arteries and heart. What foods have cholesterol? Cholesterol is found in animal-based foods, such as meat, seafood, and dairy. Generally, low-fat dairy and lean meats have less cholesterol than full-fat dairy and fatty meats. The milligrams of cholesterol per serving (mg per serving) of common cholesterol-containing foods are listed below. Meats and other proteins Egg -- one large whole egg has 186 mg. Veal shank -- 4 oz (113 g) has 141 mg. Lean ground [place] (93% lean) -- 4 oz (113 g) has 118 mg. Fat-trimmed lamb loin -- 4 oz (113 g) has 106 mg. Lean ground beef (90% lean) -- 4 oz (113 g) has 100 mg. Lobster -- 3.5 oz (99 g) has 90 mg. Pork loin chops -- 4 oz (113 g) has 86 mg. Canned salmon -- 3.5 oz (99 g) has 83 mg. Fat-trimmed beef top loin -- 4 oz (113 g) has 78 mg. Frankfurter -- 1 frank (3.5 oz or 99 g) has 77 mg. Crab -- 3.5 oz (99 g) has 71 mg. Roasted chicken without skin, white meat -- 4 oz (113 g) has 66 mg. Light bologna -- 2 oz (57 g) has 45 mg. Deli-cut [place] -- 2 oz (57 g) has 31 mg. Canned tuna -- 3.5 oz (99 g) has 31 mg. Berniece Salines -- 1 oz (28 g) has 29 mg. Oysters and mussels (raw) -- 3.5 oz (99 g) has 25 mg. Mackerel -- 1 oz (28 g) has 22 mg. Trout -- 1 oz (28 g) has 20 mg. Pork sausage -- 1 link (1 oz or 28 g) has 17 mg. Salmon -- 1 oz (28 g) has 16 mg. Tilapia -- 1 oz (28 g) has 14 mg. Dairy Soft-serve ice cream --  cup (4 oz or 86 g) has 103 mg. Whole-milk yogurt -- 1 cup  (8 oz or 245 g) has 29 mg. Cheddar cheese -- 1 oz (28 g) has 28 mg. American cheese -- 1 oz (28 g) has 28 mg. Whole milk -- 1 cup (8 oz or 250 mL) has 23 mg. 2% milk -- 1 cup (8 oz or 250 mL) has 18 mg. Cream cheese -- 1 tablespoon (Tbsp) (14.5 g) has 15 mg. Cottage cheese --  cup (4 oz or 113 g) has 14 mg. Low-fat (1%) milk -- 1 cup (8 oz or 250 mL) has 10 mg. Sour cream -- 1 Tbsp (12 g) has 8.5 mg. Low-fat yogurt -- 1 cup (8 oz or 245 g) has 8 mg. Nonfat Greek yogurt -- 1 cup (8 oz or 228 g) has 7 mg. Half-and-half cream -- 1 Tbsp (15 mL) has 5 mg. Fats and oils Cod liver oil -- 1 tablespoon (Tbsp) (13.6 g) has 82 mg. Butter -- 1 Tbsp (14 g) has 15 mg. Lard -- 1 Tbsp (12.8 g) has 14 mg. Bacon grease -- 1 Tbsp (12.9 g) has 14 mg. Mayonnaise -- 1 Tbsp (13.8 g) has 5-10 mg. Margarine -- 1 Tbsp (14  g) has 3-10 mg. The items listed above may not be a complete list of foods with cholesterol. Exact amounts of cholesterol in these foods may vary depending on specific ingredients and brands. Contact a dietitian for more information. What foods do not have cholesterol? Most plant-based foods do not have cholesterol unless you combine them with a food that has cholesterol. Foods without cholesterol include: Grains and cereals. Vegetables. Fruits. Vegetable oils, such as olive, canola, and sunflower oil. Legumes, such as peas, beans, and lentils. Nuts and seeds. Egg whites. The items listed above may not be a complete list of foods that do not have cholesterol. Contact a dietitian for more information. Summary The body needs cholesterol in small amounts, but too much cholesterol can cause damage to the arteries and heart. Cholesterol is found in animal-based foods, such as meat, seafood, and dairy. Generally, low-fat dairy and lean meats have less cholesterol than full-fat dairy and fatty meats. This information is not intended to replace advice given to you by your health care provider. Make  sure you discuss any questions you have with your health care provider. Document Revised: 07/27/2020 Document Reviewed: 07/27/2020 Elsevier Patient Education  [place].  High Cholesterol High cholesterol is a condition in which the blood has high levels of a white, waxy substance similar to fat (cholesterol). The liver makes all the cholesterol that the body needs. The human body needs small amounts of cholesterol to help build cells. A person gets extra or excess cholesterol from the food that he or she eats. The blood carries cholesterol from the liver to the rest of the body. If you have high cholesterol, deposits (plaques) may build up on the walls of your arteries. Arteries are the blood vessels that carry blood away from your heart. These plaques make the arteries narrow and stiff. Cholesterol plaques increase your risk for heart attack and stroke. Work with your health care provider to keep your cholesterol levels in a healthy range. What increases the risk? The following factors may make you more likely to develop this condition: Eating foods that are high in animal fat (saturated fat) or cholesterol. Being overweight. Not getting enough exercise. A family history of high cholesterol (familial hypercholesterolemia). Use of tobacco products. Having diabetes. What are the signs or symptoms? In most cases, high cholesterol does not usually cause any symptoms. In severe cases, very high cholesterol levels can cause: Fatty bumps under the skin (xanthomas). A white or gray ring around the black center (pupil) of the eye. How is this diagnosed? This condition may be diagnosed based on the results of a blood test. If you are older than 70 years of age, your health care provider may check your cholesterol levels every 4-6 years. You may be checked more often if you have high cholesterol or other risk factors for heart disease. The blood test for cholesterol measures: "Bad"  cholesterol, or LDL cholesterol. This is the main type of cholesterol that causes heart disease. The desired level is less than 100 mg/dL (2.59 mmol/L). "Good" cholesterol, or HDL cholesterol. HDL helps protect against heart disease by cleaning the arteries and carrying the LDL to the liver for processing. The desired level for HDL is 60 mg/dL (1.55 mmol/L) or higher. Triglycerides. These are fats that your body can store or burn for energy. The desired level is less than 150 mg/dL (1.69 mmol/L). Total cholesterol. This measures the total amount of cholesterol in your blood and includes LDL, HDL, and triglycerides. The  desired level is less than 200 mg/dL (5.17 mmol/L). How is this treated? Treatment for high cholesterol starts with lifestyle changes, such as diet and exercise. Diet changes. You may be asked to eat foods that have more fiber and less saturated fats or added sugar. Lifestyle changes. These may include regular exercise, maintaining a healthy weight, and quitting use of tobacco products. Medicines. These are given when diet and lifestyle changes have not worked. You may be prescribed a statin medicine to help lower your cholesterol levels. Follow these instructions at home: Eating and drinking  Eat a healthy, balanced diet. This diet includes: Daily servings of a variety of fresh, frozen, or canned fruits and vegetables. Daily servings of whole grain foods that are rich in fiber. Foods that are low in saturated fats and trans fats. These include poultry and fish without skin, lean cuts of meat, and low-fat dairy products. A variety of fish, especially oily fish that contain omega-3 fatty acids. Aim to eat fish at least 2 times a week. Avoid foods and drinks that have added sugar. Use healthy cooking methods, such as roasting, grilling, broiling, baking, poaching, steaming, and stir-frying. Do not fry your food except for stir-frying. If you drink alcohol: Limit how much you have  to: 0-1 drink a day for women who are not pregnant. 0-2 drinks a day for men. Know how much alcohol is in a drink. In the [place] one drink equals one 12 oz bottle of beer (355 mL), one 5 oz glass of wine (148 mL), or one 1 oz glass of hard liquor (44 mL). Lifestyle  Get regular exercise. Aim to exercise for a total of 150 minutes a week. Increase your activity level by doing activities such as gardening, walking, and taking the stairs. Do not use any products that contain nicotine or tobacco. These products include cigarettes, chewing tobacco, and vaping devices, such as e-cigarettes. If you need help quitting, ask your health care provider. General instructions Take over-the-counter and prescription medicines only as told by your health care provider. Keep all follow-up visits. This is important. Where to find more information American Heart Association: www.heart.org National Heart, Lung, and Blood Institute: https://wilson-eaton.com/ Contact a health care provider if: You have trouble achieving or maintaining a healthy diet or weight. You are starting an exercise program. You are unable to stop smoking. Get help right away if: You have chest pain. You have trouble breathing. You have discomfort or pain in your jaw, neck, back, shoulder, or arm. You have any symptoms of a stroke. "BE FAST" is an easy way to remember the main warning signs of a stroke: B - Balance. Signs are dizziness, sudden trouble walking, or loss of balance. E - Eyes. Signs are trouble seeing or a sudden change in vision. F - Face. Signs are sudden weakness or numbness of the face, or the face or eyelid drooping on one side. A - Arms. Signs are weakness or numbness in an arm. This happens suddenly and usually on one side of the body. S - Speech. Signs are sudden trouble speaking, slurred speech, or trouble understanding what people say. T - Time. Time to call emergency services. Write down what time symptoms started. You  have other signs of a stroke, such as: A sudden, severe headache with no known cause. Nausea or vomiting. Seizure. These symptoms may represent a serious problem that is an emergency. Do not wait to see if the symptoms will go away. Get medical help right away. Call  your local emergency services (911 in the [place] Do not drive yourself to the hospital. Summary Cholesterol plaques increase your risk for heart attack and stroke. Work with your health care provider to keep your cholesterol levels in a healthy range. Eat a healthy, balanced diet, get regular exercise, and maintain a healthy weight. Do not use any products that contain nicotine or tobacco. These products include cigarettes, chewing tobacco, and vaping devices, such as e-cigarettes. Get help right away if you have any symptoms of a stroke. This information is not intended to replace advice given to you by your health care provider. Make sure you discuss any questions you have with your health care provider. Document Revised: 05/31/2020 Document Reviewed: 05/21/2020 Elsevier Patient Education  2022 [employer].

## 2021-02-28 NOTE — Progress Notes (Signed)
Chief Complaint  Patient presents with   Follow-up   Hypertension    Patient took blood pressure medication this morning at 8:30 am.    F/u  1. Htn and hld on pravastatin 10 mg qhs and norvasc 5 mg qd bp 150/94 repeat 144/86 on norvasc 5 mg qd checking at home but not consistently    Review of Systems  Constitutional:  Negative for weight loss.  HENT:  Negative for hearing loss.   Eyes:  Negative for blurred vision.  Respiratory:  Negative for shortness of breath.   Cardiovascular:  Negative for chest pain.  Gastrointestinal:  Negative for abdominal pain and blood in stool.  Musculoskeletal:  Negative for back pain.  Skin:  Negative for rash.  Neurological:  Negative for headaches.  Psychiatric/Behavioral:  Negative for depression.   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 and some scarring in left eye but not blind    COVID-19    03/2020   Depression    remote h/o suicide ideation    GERD (gastroesophageal reflux disease)    Hypertension    Sleep apnea    Past Surgical History:  Procedure Laterality Date   COLONOSCOPY WITH PROPOFOL N/A 04/01/2018   Procedure: COLONOSCOPY WITH PROPOFOL;  Surgeon: Lollie Sails, MD;  Location: [facility] ENDOSCOPY;  Service: Endoscopy;  Laterality: N/A;   ESOPHAGOGASTRODUODENOSCOPY N/A 04/01/2018   Procedure: ESOPHAGOGASTRODUODENOSCOPY (EGD);  Surgeon: Lollie Sails, MD;  Location: [facility] ENDOSCOPY;  Service: Endoscopy;  Laterality: N/A;   HEMORROIDECTOMY  1980   VASECTOMY     Family History  Problem Relation Age of Onset   Stroke Father    Heart attack Father        h/o cig smoker    Prostate cancer Father        ?   Heart disease Father    Other Father        smoker   Dementia Mother    Stroke Mother        9s   Heart attack Paternal Grandmother    Social History   Socioeconomic History   Marital status: Married    Spouse name: Theatre manager   Number of children: 2   Years of  education: 12   Highest education level: Associate degree: academic program  Occupational History   Occupation: retired  Tobacco Use   Smoking status: Never   Smokeless tobacco: Never  Vaping Use   Vaping Use: Never used  Substance and Sexual Activity   Alcohol use: Yes    Alcohol/week: 0.0 standard drinks   Drug use: No   Sexual activity: Yes    Partners: Female  Other Topics Concern   Not on file  Social History Narrative   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, radio    Never smoker    Wears eye glasses   Social Determinants of Radio broadcast assistant Strain: Low Risk    Difficulty of Paying Living Expenses: Not hard at all  Food Insecurity: No Food Insecurity   Worried About Charity fundraiser in the Last Year: Never true   Arboriculturist in the Last Year: Never true  Transportation Needs: No Transportation Needs   Lack of Transportation (Medical): No   Lack of Transportation (Non-Medical): No  Physical Activity: Sufficiently Active   Days of Exercise per Week: 3 days  Minutes of Exercise per Session: 60 min  Stress: No Stress Concern Present   Feeling of Stress : Not at all  Social Connections: Socially Integrated   Frequency of Communication with Friends and Family: Three times a week   Frequency of Social Gatherings with Friends and Family: Three times a week   Attends Religious Services: More than 4 times per year   Active Member of Clubs or Organizations: Yes   Attends Music therapist: More than 4 times per year   Marital Status: Married  Human resources officer Violence: Not At Risk   Fear of Current or Ex-Partner: No   Emotionally Abused: No   Physically Abused: No   Sexually Abused: No   Current Meds  Medication Sig   RABEprazole (ACIPHEX) 20 MG tablet Take 1 tablet (20 mg total) by mouth daily. 30 min before breakfast or dinner   tamsulosin (FLOMAX) 0.4 MG CAPS capsule TAKE 1 CAPSULE BY MOUTH ONCE  DAILY   [DISCONTINUED] amLODipine (NORVASC) 5 MG tablet Take 1 tablet (5 mg total) by mouth daily.   [DISCONTINUED] pravastatin (PRAVACHOL) 10 MG tablet Take 1 tablet (10 mg total) by mouth daily. After 6 pm   Allergies  Allergen Reactions   Omeprazole Diarrhea   Statins     Fatigue,muscle aches    Recent Results (from the past 2160 hour(s))  CBC with Differential/Platelet     Status: None   Collection Time: 02/20/21  9:28 AM  Result Value Ref Range   WBC 4.2 4.0 - 10.5 K/uL   RBC 4.64 4.22 - 5.81 Mil/uL   Hemoglobin 15.1 13.0 - 17.0 g/dL   HCT 43.5 39.0 - 52.0 %   MCV 93.9 78.0 - 100.0 fl   MCHC 34.7 30.0 - 36.0 g/dL   RDW 12.8 11.5 - 15.5 %   Platelets 219.0 150.0 - 400.0 K/uL   Neutrophils Relative % 60.2 43.0 - 77.0 %   Lymphocytes Relative 30.7 12.0 - 46.0 %   Monocytes Relative 7.0 3.0 - 12.0 %   Eosinophils Relative 1.6 0.0 - 5.0 %   Basophils Relative 0.5 0.0 - 3.0 %   Neutro Abs 2.5 1.4 - 7.7 K/uL   Lymphs Abs 1.3 0.7 - 4.0 K/uL   Monocytes Absolute 0.3 0.1 - 1.0 K/uL   Eosinophils Absolute 0.1 0.0 - 0.7 K/uL   Basophils Absolute 0.0 0.0 - 0.1 K/uL  Lipid panel     Status: Abnormal   Collection Time: 02/20/21  9:28 AM  Result Value Ref Range   Cholesterol 163 0 - 200 mg/dL    Comment: ATP III Classification       Desirable:  < 200 mg/dL               Borderline High:  200 - 239 mg/dL          High:  > = 240 mg/dL   Triglycerides 92.0 0.0 - 149.0 mg/dL    Comment: Normal:  <150 mg/dLBorderline High:  150 - 199 mg/dL   HDL 43.60 >39.00 mg/dL   VLDL 18.4 0.0 - 40.0 mg/dL   LDL Cholesterol 101 (H) 0 - 99 mg/dL   Total CHOL/HDL Ratio 4     Comment:                Men          Women1/2 Average Risk     3.4          3.3Average Risk  5.0          4.42X Average Risk          9.6          7.13X Average Risk          15.0          11.0                       NonHDL 119.62     Comment: NOTE:  Non-HDL goal should be 30 mg/dL higher than patient's LDL goal (i.e. LDL  goal of < 70 mg/dL, would have non-HDL goal of < 100 mg/dL)  Comprehensive metabolic panel     Status: None   Collection Time: 02/20/21  9:28 AM  Result Value Ref Range   Sodium 140 135 - 145 mEq/L   Potassium 4.4 3.5 - 5.1 mEq/L   Chloride 104 96 - 112 mEq/L   CO2 30 19 - 32 mEq/L   Glucose, Bld 93 70 - 99 mg/dL   BUN 18 6 - 23 mg/dL   Creatinine, Ser 1.03 0.40 - 1.50 mg/dL   Total Bilirubin 0.7 0.2 - 1.2 mg/dL   Alkaline Phosphatase 68 39 - 117 U/L   AST 19 0 - 37 U/L   ALT 13 0 - 53 U/L   Total Protein 7.0 6.0 - 8.3 g/dL   Albumin 4.4 3.5 - 5.2 g/dL   GFR 73.84 >60.00 mL/min    Comment: Calculated using the CKD-EPI Creatinine Equation (2021)   Calcium 9.4 8.4 - 10.5 mg/dL   Objective  Body mass index is 26.07 kg/m. Wt Readings from Last 3 Encounters:  02/28/21 178 lb (80.7 kg)  01/18/21 178 lb 6.4 oz (80.9 kg)  10/09/20 178 lb (80.7 kg)   Temp Readings from Last 3 Encounters:  02/28/21 (!) 97.1 F (36.2 C) (Temporal)  01/18/21 97.8 F (36.6 C) (Oral)  06/09/19 98.2 F (36.8 C) (Temporal)   BP Readings from Last 3 Encounters:  02/28/21 (!) 146/90  01/18/21 (!) 210/100  10/09/20 (!) 200/120   Pulse Readings from Last 3 Encounters:  02/28/21 61  01/18/21 83  10/09/20 (!) 59    Physical Exam Vitals and nursing note reviewed.  Constitutional:      Appearance: Normal appearance. He is well-developed and well-groomed. He is obese.  HENT:     Head: Normocephalic and atraumatic.  Eyes:     Conjunctiva/sclera: Conjunctivae normal.     Pupils: Pupils are equal, round, and reactive to light.  Cardiovascular:     Rate and Rhythm: Normal rate and regular rhythm.     Heart sounds: Normal heart sounds.  Pulmonary:     Effort: Pulmonary effort is normal. No respiratory distress.     Breath sounds: Normal breath sounds.  Abdominal:     Tenderness: There is no abdominal tenderness.  Musculoskeletal:     Lumbar back: Tenderness present. Negative right straight leg  raise test and negative left straight leg raise test.  Skin:    General: Skin is warm and moist.  Neurological:     General: No focal deficit present.     Mental Status: He is alert and oriented to person, place, and time. Mental status is at baseline.     Sensory: Sensation is intact.     Motor: Motor function is intact.     Coordination: Coordination is intact.     Gait: Gait is intact. Gait normal.  Psychiatric:  Attention and Perception: Attention and perception normal.        Mood and Affect: Mood and affect normal.        Speech: Speech normal.        Behavior: Behavior normal. Behavior is cooperative.        Thought Content: Thought content normal.        Cognition and Memory: Cognition and memory normal.        Judgment: Judgment normal.    Assessment  Plan  Hypertension, unspecified type - Plan: amLODipine (NORVASC) 10 MG tablet Improved BP increase 5 to 10   Hyperlipidemia, unspecified hyperlipidemia type - Plan: pravastatin (PRAVACHOL) 20 MG tablet   Elevated PSA 5.1 10/03/20  Lab visit urology 03/2021 disc with pt consider prostate mRI if elevated again    HM flu shot high dose not had 2022 wants in 02/2021 utd prevnar  consider pna 23 declines  tdap utd 04/14/16 had and shingrix 2/2 2nd utd  covid vx declines   PSA with h/o BPH 5.0 07/17/20 f/u urology PSA 10/03/20 5.1  -->f/u urology Dr. Erlene Quan appt 03/2021 disc with pt consider MRI prostate to further w/u elevated PSA   EGD 04/01/18 + barretts will need f/u in 3 years -prev CC'ed [facility] Dr. Gustavo Lah to see if pt should be on PPI prev. Omeprazole caused diarrhea Prior response rec aciphex/rabeprazole or protonix more tolerated as omeprazole/lasoprazole had diarrhea    Colonoscopy 04/01/18 serrated polyp negative pathology [facility]    Skin as of 07/16/20 week saw derm f/u yearly  ->dry disc cetaphil/cerave   [facility] in [place] no changes x 2 yrs 07/20/20    Cards Dr. Rockey Situ consider in future last seen  01/2018 no current issues as of 03/20/19 or 06/09/19 but has persistent w/o sxs bradycardia   rec healthy diet and exercise    Provider: Dr. Olivia Mackie McLean-Scocuzza-Internal Medicine

## 2021-03-27 ENCOUNTER — Other Ambulatory Visit: Payer: Self-pay | Admitting: *Deleted

## 2021-03-27 DIAGNOSIS — R972 Elevated prostate specific antigen [PSA]: Secondary | ICD-10-CM

## 2021-04-11 ENCOUNTER — Other Ambulatory Visit: Payer: PPO

## 2021-04-11 ENCOUNTER — Other Ambulatory Visit: Payer: Self-pay

## 2021-04-11 DIAGNOSIS — R972 Elevated prostate specific antigen [PSA]: Secondary | ICD-10-CM

## 2021-04-12 ENCOUNTER — Telehealth: Payer: Self-pay | Admitting: *Deleted

## 2021-04-12 DIAGNOSIS — R972 Elevated prostate specific antigen [PSA]: Secondary | ICD-10-CM

## 2021-04-12 LAB — PSA: Prostate Specific Ag, Serum: 5.9 ng/mL — ABNORMAL HIGH (ref 0.0–4.0)

## 2021-04-12 NOTE — Telephone Encounter (Addendum)
Left Vm for patient to return my call  ----- Message from Hollice Espy, MD sent at 04/12/2021 12:19 PM EST ----- PSA has gone up more now to 5.9.  I think at this point up MRI seems most reasonable.  If he is agreeable, please order the study and have him follow-up with me thereafter.  Hollice Espy, MD

## 2021-04-15 NOTE — Telephone Encounter (Signed)
Spoke with patient, ordered MRI,scheduled follow up. Voiced understanding.

## 2021-04-23 DIAGNOSIS — Z86010 Personal history of colonic polyps: Secondary | ICD-10-CM | POA: Diagnosis not present

## 2021-04-23 DIAGNOSIS — K2270 Barrett's esophagus without dysplasia: Secondary | ICD-10-CM | POA: Diagnosis not present

## 2021-04-28 ENCOUNTER — Other Ambulatory Visit: Payer: Self-pay

## 2021-04-28 ENCOUNTER — Ambulatory Visit
Admission: RE | Admit: 2021-04-28 | Discharge: 2021-04-28 | Disposition: A | Discharge status: Home / Self Care | Payer: PPO | Source: Ambulatory Visit | Attending: Urology | Admitting: Urology

## 2021-04-28 DIAGNOSIS — R590 Localized enlarged lymph nodes: Secondary | ICD-10-CM | POA: Diagnosis not present

## 2021-04-28 DIAGNOSIS — N323 Diverticulum of bladder: Secondary | ICD-10-CM | POA: Diagnosis not present

## 2021-04-28 DIAGNOSIS — R972 Elevated prostate specific antigen [PSA]: Secondary | ICD-10-CM

## 2021-04-28 DIAGNOSIS — N400 Enlarged prostate without lower urinary tract symptoms: Secondary | ICD-10-CM | POA: Diagnosis not present

## 2021-04-28 MED ORDER — GADOBUTROL 1 MMOL/ML IV SOLN
7.5000 mL | Freq: Once | INTRAVENOUS | Status: AC | PRN
  Administered 2021-04-28: 7.5 mL via INTRAVENOUS

## 2021-04-29 ENCOUNTER — Ambulatory Visit: Admission: RE | Admit: 2021-04-29 | Payer: PPO | Source: Ambulatory Visit

## 2021-04-30 NOTE — Progress Notes (Signed)
05/01/21 4:05 PM   [name] [name] 10-08-1950 [id number]  Referring provider:  McLean-Scocuzza, [name] Glow, MD [place],  [place] [address] Chief Complaint  Patient presents with   Elevated PSA     HPI: [name] [name] is a 71 y.o.male male with a personal history of BPH and elevated PSA returns  for follow-up with prostate MRI. He is accompanied today by his wife.    He underwent a prostate MRI on 04/28/2021 that visualized PI-RADS category 3 lesion of the left posterolateral peripheral zone at the base and benign prostatic hypertrophy and mild prostatomegaly. Prostate volume 50.90 cc (4.7 by 3.9 by 5.9 cm).  Component     Latest Ref Rng & Units 09/29/2017 09/20/2018 10/04/2019 07/17/2020  Prostate Specific Ag, Serum     0.0 - 4.0 ng/mL 3.8 3.5 5.0 (H) 5.0 (H)   Component     Latest Ref Rng & Units 10/03/2020 04/11/2021  Prostate Specific Ag, Serum     0.0 - 4.0 ng/mL 5.1 (H) 5.9 (H)     PMH: 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 and some scarring in left eye but not blind    COVID-19    03/2020   Depression    remote h/o suicide ideation    GERD (gastroesophageal reflux disease)    Hypertension    Sleep apnea     Surgical History: Past Surgical History:  Procedure Laterality Date   COLONOSCOPY WITH PROPOFOL N/A 04/01/2018   Procedure: COLONOSCOPY WITH PROPOFOL;  Surgeon: Lollie Sails, MD;  Location: [facility] ENDOSCOPY;  Service: Endoscopy;  Laterality: N/A;   ESOPHAGOGASTRODUODENOSCOPY N/A 04/01/2018   Procedure: ESOPHAGOGASTRODUODENOSCOPY (EGD);  Surgeon: Lollie Sails, MD;  Location: [facility] ENDOSCOPY;  Service: Endoscopy;  Laterality: N/A;   HEMORROIDECTOMY  1980   VASECTOMY      Home Medications:  Allergies as of 05/01/2021       Reactions   Omeprazole Diarrhea   Statins    Fatigue,muscle aches        Medication List        Accurate as of May 01, 2021  4:05 PM. If you have any questions, ask  your nurse or doctor.          amLODipine 10 MG tablet Commonly known as: Norvasc Take 1 tablet (10 mg total) by mouth daily.   pravastatin 20 MG tablet Commonly known as: PRAVACHOL Take 1 tablet (20 mg total) by mouth daily. After 6 pm   RABEprazole 20 MG tablet Commonly known as: ACIPHEX Take 1 tablet (20 mg total) by mouth daily. 30 min before breakfast or dinner   tamsulosin 0.4 MG Caps capsule Commonly known as: FLOMAX TAKE 1 CAPSULE BY MOUTH ONCE DAILY        Allergies:  Allergies  Allergen Reactions   Omeprazole Diarrhea   Statins     Fatigue,muscle aches     Family History: Family History  Problem Relation Age of Onset   Dementia Mother    Stroke Mother        55s   Stroke Father    Heart attack Father        h/o cig smoker    Prostate cancer Father        ?   Heart disease Father    Other Father        smoker   Cancer Brother    Esophageal cancer Brother  Heart attack Paternal Grandmother     Social History:  reports that he has never smoked. He has never used smokeless tobacco. He reports current alcohol use. He reports that he does not use drugs.   Physical Exam: BP 129/76    Pulse 64    Ht 5\' 11"  (1.803 m)    Wt 169 lb (76.7 kg)    BMI 23.57 kg/m   Constitutional:  Alert and oriented, No acute distress. HEENT: [place] AT, moist mucus membranes.  Trachea midline, no masses. Cardiovascular: No clubbing, cyanosis, or edema. Respiratory: Normal respiratory effort, no increased work of breathing. Skin: No rashes, bruises or suspicious lesions. Neurologic: Grossly intact, no focal deficits, moving all 4 extremities. Psychiatric: Normal mood and affect.  Laboratory Data:  Lab Results  Component Value Date   CREATININE 1.03 02/20/2021   Lab Results  Component Value Date   PSA 4.2 06/16/2017    Assessment & Plan:    Elevated PSA  - MRI revealed PI-RADS 3, relatively equivocal -discussed sensitively and specificity of this test - We  discussed further evaluation including surveillance versus fusion biopsy versus prostate biopsy. He is interested in surveillance.   -If PSA rises again at 6 month interval, recommend fusion biopsy to which he would be agreeable  - Repeat PSA bi-annually   2. BPH with nocturia  - Symptoms stable/well-controlled on Flomax, will continue this medication  F/u 6 mo with PSA  I,Kailey Littlejohn,acting as a scribe for Hollice Espy, MD.,have documented all relevant documentation on the behalf of Hollice Espy, MD,as directed by  Hollice Espy, MD while in the presence of Hollice Espy, MD.  I have reviewed the above documentation for accuracy and completeness, and I agree with the above.   Hollice Espy, MD   [facility] [address] [place], [place] [address] [phone]

## 2021-05-01 ENCOUNTER — Other Ambulatory Visit: Payer: Self-pay

## 2021-05-01 ENCOUNTER — Ambulatory Visit: Payer: PPO | Admitting: Urology

## 2021-05-01 VITALS — BP 129/76 | HR 64 | Ht 71.0 in | Wt 169.0 lb

## 2021-05-01 DIAGNOSIS — R972 Elevated prostate specific antigen [PSA]: Secondary | ICD-10-CM

## 2021-05-29 ENCOUNTER — Other Ambulatory Visit: Payer: Self-pay

## 2021-05-29 ENCOUNTER — Encounter: Payer: Self-pay | Admitting: Internal Medicine

## 2021-05-29 ENCOUNTER — Ambulatory Visit (INDEPENDENT_AMBULATORY_CARE_PROVIDER_SITE_OTHER): Payer: PPO | Admitting: Internal Medicine

## 2021-05-29 VITALS — BP 116/70 | HR 60 | Temp 98.2°F | Ht 71.0 in | Wt 171.0 lb

## 2021-05-29 DIAGNOSIS — E785 Hyperlipidemia, unspecified: Secondary | ICD-10-CM | POA: Diagnosis not present

## 2021-05-29 DIAGNOSIS — R6889 Other general symptoms and signs: Secondary | ICD-10-CM

## 2021-05-29 DIAGNOSIS — I10 Essential (primary) hypertension: Secondary | ICD-10-CM

## 2021-05-29 DIAGNOSIS — K2270 Barrett's esophagus without dysplasia: Secondary | ICD-10-CM

## 2021-05-29 MED ORDER — RABEPRAZOLE SODIUM 20 MG PO TBEC: 20.0000 mg | DELAYED_RELEASE_TABLET | Freq: Every day | ORAL | 3 refills | Status: DC

## 2021-05-29 NOTE — Progress Notes (Signed)
Chief Complaint  Patient presents with   Follow-up   F/u  1. Htn controlled on norvasc 10 mg qd no dizziness doing well not checking at home  2. C/o cold intolerance cbc normal, iron normal check tsh   Review of Systems  Constitutional:  Negative for weight loss.  HENT:  Negative for hearing loss.   Eyes:  Negative for blurred vision.  Respiratory:  Negative for shortness of breath.   Cardiovascular:  Negative for chest pain.  Gastrointestinal:  Negative for abdominal pain and blood in stool.  Musculoskeletal:  Negative for back pain.  Skin:  Negative for rash.  Neurological:  Negative for headaches.  Psychiatric/Behavioral:  Negative for depression.   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 and some scarring in left eye but not blind    COVID-19    03/2020   Depression    remote h/o suicide ideation    GERD (gastroesophageal reflux disease)    Hypertension    Sleep apnea    Past Surgical History:  Procedure Laterality Date   COLONOSCOPY WITH PROPOFOL N/A 04/01/2018   Procedure: COLONOSCOPY WITH PROPOFOL;  Surgeon: Lollie Sails, MD;  Location: [facility] ENDOSCOPY;  Service: Endoscopy;  Laterality: N/A;   ESOPHAGOGASTRODUODENOSCOPY N/A 04/01/2018   Procedure: ESOPHAGOGASTRODUODENOSCOPY (EGD);  Surgeon: Lollie Sails, MD;  Location: [facility] ENDOSCOPY;  Service: Endoscopy;  Laterality: N/A;   HEMORROIDECTOMY  1980   VASECTOMY     Family History  Problem Relation Age of Onset   Dementia Mother    Stroke Mother        90s   Stroke Father    Heart attack Father        h/o cig smoker    Prostate cancer Father        ?   Heart disease Father    Other Father        smoker   Cancer Brother    Esophageal cancer Brother    Heart attack Paternal Grandmother    Social History   Socioeconomic History   Marital status: Married    Spouse name: Theatre manager   Number of children: 2   Years of education: 12   Highest education level:  Associate degree: academic program  Occupational History   Occupation: retired  Tobacco Use   Smoking status: Never   Smokeless tobacco: Never  Vaping Use   Vaping Use: Never used  Substance and Sexual Activity   Alcohol use: Yes    Alcohol/week: 0.0 standard drinks   Drug use: No   Sexual activity: Yes    Partners: Female  Other Topics Concern   Not on file  Social History Narrative   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, radio    Never smoker    Wears eye glasses   Social Determinants of Radio broadcast assistant Strain: Low Risk    Difficulty of Paying Living Expenses: Not hard at all  Food Insecurity: No Food Insecurity   Worried About Charity fundraiser in the Last Year: Never true   Arboriculturist in the Last Year: Never true  Transportation Needs: No Transportation Needs   Lack of Transportation (Medical): No   Lack of Transportation (Non-Medical): No  Physical Activity: Sufficiently Active   Days of Exercise per Week: 3 days   Minutes of Exercise per Session: 60 min  Stress: No Stress Concern Present   Feeling of Stress : Not at all  Social Connections: Socially Integrated   Frequency of Communication with Friends and Family: Three times a week   Frequency of Social Gatherings with Friends and Family: Three times a week   Attends Religious Services: More than 4 times per year   Active Member of Clubs or Organizations: Yes   Attends Music therapist: More than 4 times per year   Marital Status: Married  Human resources officer Violence: Not At Risk   Fear of Current or Ex-Partner: No   Emotionally Abused: No   Physically Abused: No   Sexually Abused: No   Current Meds  Medication Sig   amLODipine (NORVASC) 10 MG tablet Take 1 tablet (10 mg total) by mouth daily.   pravastatin (PRAVACHOL) 20 MG tablet Take 1 tablet (20 mg total) by mouth daily. After 6 pm   tamsulosin (FLOMAX) 0.4 MG CAPS capsule TAKE 1  CAPSULE BY MOUTH ONCE DAILY   [DISCONTINUED] RABEprazole (ACIPHEX) 20 MG tablet Take 1 tablet (20 mg total) by mouth daily. 30 min before breakfast or dinner   Allergies  Allergen Reactions   Omeprazole Diarrhea   Statins     Fatigue,muscle aches    Recent Results (from the past 2160 hour(s))  PSA     Status: Abnormal   Collection Time: 04/11/21 10:49 AM  Result Value Ref Range   Prostate Specific Ag, Serum 5.9 (H) 0.0 - 4.0 ng/mL    Comment: Roche ECLIA methodology. According to the American Urological Association, Serum PSA should decrease and remain at undetectable levels after radical prostatectomy. The AUA defines biochemical recurrence as an initial PSA value 0.2 ng/mL or greater followed by a subsequent confirmatory PSA value 0.2 ng/mL or greater. Values obtained with different assay methods or kits cannot be used interchangeably. Results cannot be interpreted as absolute evidence of the presence or absence of malignant disease.    Objective  Body mass index is 23.85 kg/m. Wt Readings from Last 3 Encounters:  05/29/21 171 lb (77.6 kg)  05/01/21 169 lb (76.7 kg)  02/28/21 178 lb (80.7 kg)   Temp Readings from Last 3 Encounters:  05/29/21 98.2 F (36.8 C) (Oral)  02/28/21 (!) 97.1 F (36.2 C) (Temporal)  01/18/21 97.8 F (36.6 C) (Oral)   BP Readings from Last 3 Encounters:  05/29/21 116/70  05/01/21 129/76  02/28/21 (!) 146/90   Pulse Readings from Last 3 Encounters:  05/29/21 60  05/01/21 64  02/28/21 61    Physical Exam Vitals and nursing note reviewed.  Constitutional:      Appearance: Normal appearance. He is well-developed and well-groomed.  HENT:     Head: Normocephalic and atraumatic.  Eyes:     Conjunctiva/sclera: Conjunctivae normal.     Pupils: Pupils are equal, round, and reactive to light.  Cardiovascular:     Rate and Rhythm: Normal rate and regular rhythm.     Heart sounds: Normal heart sounds.  Pulmonary:     Effort: Pulmonary  effort is normal. No respiratory distress.     Breath sounds: Normal breath sounds.  Abdominal:     Tenderness: There is no abdominal tenderness.  Skin:    General: Skin is warm and moist.  Neurological:     General: No focal deficit present.     Mental Status: He is alert and oriented to person, place, and time. Mental status is at baseline.     Sensory: Sensation is intact.  Motor: Motor function is intact.     Coordination: Coordination is intact.     Gait: Gait is intact. Gait normal.  Psychiatric:        Attention and Perception: Attention and perception normal.        Mood and Affect: Mood and affect normal.        Speech: Speech normal.        Behavior: Behavior normal. Behavior is cooperative.        Thought Content: Thought content normal.        Cognition and Memory: Cognition and memory normal.        Judgment: Judgment normal.    Assessment  Plan  Hyperlipidemia, unspecified hyperlipidemia type - Plan: Lipid panel Controlled BP on norvasc 10 mg qd  On pravachol  Cold intolerance - Plan: TSH  Hypertension, controlled and improved - Plan: Comprehensive metabolic panel, Lipid panel  Barrett's esophagus without dysplasia - Plan: RABEprazole (ACIPHEX) 20 MG tablet   HM flu shot high dose not had 2022 wants in 02/2021 utd prevnar  consider pna 23 declines  tdap utd 04/14/16 had and shingrix 2/2 2nd utd  covid vx declines   PSA with h/o BPH 5.0 07/17/20 f/u urology PSA 10/03/20 5.1  -->f/u urology Dr. Erlene Quan appt 03/2021 disc with pt consider MRI prostate to further w/u elevated PSA MRI prostate 03/2021 continue to f/u PSA Q6 months  +bph and PL base prostate lesion ?   EGD 04/01/18 + barretts will need f/u in 3 years -prev CC'ed [facility] Dr. Gustavo Lah to see if pt should be on PPI prev. Omeprazole caused diarrhea Prior response rec aciphex/rabeprazole or protonix more tolerated as omeprazole/lasoprazole had diarrhea    Colonoscopy 04/01/18 serrated polyp negative  pathology [facility]  Egd/colonoscopy end of 06/2021    Skin as of 07/16/20 week saw derm f/u yearly  ->dry disc cetaphil/cerave   [facility] in [place] no changes x 2 yrs 07/20/20    Cards Dr. Rockey Situ consider in future last seen 01/2018 no current issues as of 03/20/19 or 06/09/19 but has persistent w/o sxs bradycardia   rec healthy diet and exercise  Provider: Dr. Olivia Mackie McLean-Scocuzza-Internal Medicine

## 2021-05-29 NOTE — Patient Instructions (Signed)
Great news improved blood pressure makes me happy! ? ?

## 2021-06-19 ENCOUNTER — Ambulatory Visit: Payer: PPO

## 2021-06-25 ENCOUNTER — Ambulatory Visit (INDEPENDENT_AMBULATORY_CARE_PROVIDER_SITE_OTHER): Payer: PPO

## 2021-06-25 VITALS — Ht 71.0 in | Wt 171.0 lb

## 2021-06-25 DIAGNOSIS — Z0000 Encounter for general adult medical examination without abnormal findings: Secondary | ICD-10-CM

## 2021-06-25 NOTE — Patient Instructions (Addendum)
?  Mr. Slawinski , ?Thank you for taking time to come for your Medicare Wellness Visit. I appreciate your ongoing commitment to your health goals. Please review the following plan we discussed and let me know if I can assist you in the future.  ? ?These are the goals we discussed: ? Goals   ? ?  Follow up with Primary Care Provider   ?  As needed ?  ?  Increase physical activity   ?  Continue walking for exercise ? ?  ? ?  ?  ?This is a list of the screening recommended for you and due dates:  ?Health Maintenance  ?Topic Date Due  ? Flu Shot  06/28/2021*  ? Pneumonia Vaccine (2 - PPSV23 if available, else PCV20) 02/28/2022*  ? Tetanus Vaccine  04/14/2026  ? Colon Cancer Screening  04/01/2028  ? Hepatitis C Screening: USPSTF Recommendation to screen - Ages 77-79 yo.  Completed  ? Zoster (Shingles) Vaccine  Completed  ? HPV Vaccine  Aged Out  ? COVID-19 Vaccine  Discontinued  ?*Topic was postponed. The date shown is not the original due date.  ?  ?

## 2021-06-25 NOTE — Progress Notes (Signed)
Subjective:   [name] [name] is a 71 y.o. male who presents for Medicare Annual/Subsequent preventive examination.  Review of Systems    No ROS.  Medicare Wellness Virtual Visit.  Visual/audio telehealth visit, UTA vital signs.   See social history for additional risk factors.   Cardiac Risk Factors include: advanced age (>62men, >60 women);male gender;hypertension     Objective:    Today's Vitals   06/25/21 1432  Weight: 171 lb (77.6 kg)  Height: 5\' 11"  (1.803 m)   Body mass index is 23.85 kg/m.     06/25/2021    2:38 PM 06/18/2020    1:09 PM 04/01/2018    7:16 AM  Advanced Directives  Does Patient Have a Medical Advance Directive? Yes No No  Type of Estate agent of [place];Living will    Does patient want to make changes to medical advance directive? No - Patient declined    Copy of Healthcare Power of Attorney in Chart? No - copy requested    Would patient like information on creating a medical advance directive?  No - Patient declined     Current Medications (verified) Outpatient Encounter Medications as of 06/25/2021  Medication Sig   amLODipine (NORVASC) 10 MG tablet Take 1 tablet (10 mg total) by mouth daily.   pravastatin (PRAVACHOL) 20 MG tablet Take 1 tablet (20 mg total) by mouth daily. After 6 pm   RABEprazole (ACIPHEX) 20 MG tablet Take 1 tablet (20 mg total) by mouth daily. 30 min before breakfast or dinner   tamsulosin (FLOMAX) 0.4 MG CAPS capsule TAKE 1 CAPSULE BY MOUTH ONCE DAILY   No facility-administered encounter medications on file as of 06/25/2021.    Allergies (verified) Omeprazole and Statins   History: 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 and some scarring in left eye but not blind    COVID-19    03/2020   Depression    remote h/o suicide ideation    GERD (gastroesophageal reflux disease)    Hypertension    Sleep apnea    Past Surgical History:  Procedure  Laterality Date   COLONOSCOPY WITH PROPOFOL N/A 04/01/2018   Procedure: COLONOSCOPY WITH PROPOFOL;  Surgeon: Christena Deem, MD;  Location: [facility] ENDOSCOPY;  Service: Endoscopy;  Laterality: N/A;   ESOPHAGOGASTRODUODENOSCOPY N/A 04/01/2018   Procedure: ESOPHAGOGASTRODUODENOSCOPY (EGD);  Surgeon: Christena Deem, MD;  Location[facility] END[facility]COPY;  Service: Endoscopy;  Laterality: N/A;   HEMORROIDECTOMY  1980   VASECTOMY     Family History  Problem Relation Age of Onset   Dementia Mother    Stroke Mother        81s   Stroke Father    Heart attack Father        h/o cig smoker    Prostate cancer Father        ?   Heart disease Father    Other Father        smoker   Cancer Brother    Esophageal cancer Brother    Heart attack Paternal Grandmother    Social History   Socioeconomic History   Marital status: Married    Spouse name: Geologist, engineering   Number of children: 2   Years of education: 12   Highest education level: Associate degree: academic program  Occupational History   Occupation: retired  Tobacco Use   Smoking status: Never   Smokeless tobacco: Never  Advertising account planner  Vaping Use: Never used  Substance and Sexual Activity   Alcohol use: Yes    Alcohol/week: 0.0 standard drinks   Drug use: No   Sexual activity: Yes    Partners: Female  Other Topics Concern   Not on file  Social History Narrative   DPR wife Shellie Berenguer    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    Never smoker    Wears eye glasses   Social Determinants of Corporate investment banker Strain: Low Risk    Difficulty of Paying Living Expenses: Not hard at all  Food Insecurity: No Food Insecurity   Worried About Programme researcher, broadcasting/film/video in the Last Year: Never true   Barista in the Last Year: Never true  Transportation Needs: No Transportation Needs   Lack of Transportation (Medical): No   Lack of Transportation (Non-Medical): No  Physical Activity: Sufficiently Active   Days of  Exercise per Week: 5 days   Minutes of Exercise per Session: 60 min  Stress: No Stress Concern Present   Feeling of Stress : Not at all  Social Connections: Socially Integrated   Frequency of Communication with Friends and Family: Three times a week   Frequency of Social Gatherings with Friends and Family: Three times a week   Attends Religious Services: More than 4 times per year   Active Member of Clubs or Organizations: Yes   Attends Engineer, structural: More than 4 times per year   Marital Status: Married    Tobacco Counseling Counseling given: Not Answered   Clinical Intake:  Pre-visit preparation completed: Yes        Diabetes: No  How often do you need to have someone help you when you read instructions, pamphlets, or other written materials from your doctor or pharmacy?: 1 - Never   Interpreter Needed?: No      Activities of Daily Living    06/25/2021    2:49 PM  In your present state of health, do you have any difficulty performing the following activities:  Hearing? 0  Vision? 0  Difficulty concentrating or making decisions? 0  Walking or climbing stairs? 0  Dressing or bathing? 0  Doing errands, shopping? 0  Preparing Food and eating ? N  Using the Toilet? N  In the past six months, have you accidently leaked urine? N  Do you have problems with loss of bowel control? N  Managing your Medications? N  Managing your Finances? N  Housekeeping or managing your Housekeeping? N    Patient Care Team: McLean-Scocuzza, Pasty Spillers, MD as PCP - General (Internal Medicine) Lada, Janit Bern, MD (Family Medicine) [name], MD as Consulting Physician (Urology) Christena Deem, MD (Inactive) as Consulting Physician (Gastroenterology)  Indicate any recent Medical Services you may have received from other than [facility] providers in the past year (date may be approximate).     Assessment:   This is a routine wellness examination for [name] [name].  Virtual  Visit via Telephone Note  I connected with  [name] [name] on 06/25/21 at  2:30 PM EDT by telephone and verified that I am speaking with the correct person using two identifiers. Persons participating in the virtual visit: patient/Nurse Health Advisor   I discussed the limitations of performing an evaluation and management service by telehealth. The patient expressed understanding and agreed to proceed. We continued and completed visit with audio only.Some vital signs may be absent or patient  reported.   Hearing/Vision screen Hearing Screening - Comments:: Patient is able to hear conversational tones without difficulty.  No issues reported.  Vision Screening - Comments::  Followed by Dr. Clydene Pugh They have seen their ophthalmologist in the last 12 months.    Dietary issues and exercise activities discussed: Current Exercise Habits: Home exercise routine, Type of exercise: walking, Frequency (Times/Week): 5, Intensity: Mild Healthy diet   Goals Addressed             This Visit's Progress    Increase physical activity       Continue walking for exercise        Depression Screen    06/25/2021    2:35 PM 06/18/2020    1:07 PM 06/09/2019    3:13 PM 12/29/2018    3:29 PM 08/24/2018    9:31 AM 02/22/2018    8:48 AM 01/19/2018    8:53 AM  PHQ 2/9 Scores  PHQ - 2 Score 0 0 0 0 0 0 0  PHQ- 9 Score     0 0     Fall Risk    06/25/2021    2:49 PM 01/18/2021   11:55 AM 06/18/2020    1:10 PM 06/09/2019    3:13 PM 12/29/2018    3:29 PM  Fall Risk   Falls in the past year? 0 0 0 1 0  Number falls in past yr: 0 0 0 0   Injury with Fall?  0 0 1   Risk for fall due to :  No Fall Risks  History of fall(s);Impaired vision   Follow up Falls evaluation completed Falls evaluation completed Falls evaluation completed Falls evaluation completed     FALL RISK PREVENTION PERTAINING TO THE HOME:  Home free of loose throw rugs in walkways, pet beds, electrical cords, etc? Yes  Adequate  lighting in your home to reduce risk of falls? Yes   ASSISTIVE DEVICES UTILIZED TO PREVENT FALLS: Life alert? No  Use of a cane, walker or w/c? No   TIMED UP AND GO: Was the test performed? No .   Cognitive Function:        Immunizations Immunization History  Administered Date(s) Administered   Influenza Split 01/01/2015   Influenza,inj,Quad PF,6+ Mos 12/21/2017   Pneumococcal Conjugate-13 12/21/2017   Tdap 04/14/2016   Zoster Recombinat (Shingrix) 03/03/2019, 05/11/2019   Screening Tests Health Maintenance  Topic Date Due   INFLUENZA VACCINE  06/28/2021 (Originally 10/29/2020)   Pneumonia Vaccine 10+ Years old (2 - PPSV23 if available, else PCV20) 02/28/2022 (Originally 12/22/2018)   TETANUS/TDAP  04/14/2026   COLONOSCOPY (Pts 45-15yrs Insurance coverage will need to be confirmed)  04/01/2028   Hepatitis C Screening  Completed   Zoster Vaccines- Shingrix  Completed   HPV VACCINES  Aged Out   COVID-19 Vaccine  Discontinued   Health Maintenance There are no preventive care reminders to display for this patient.  Colonoscopy scheduled- 07/02/21.  Lung Cancer Screening: (Low Dose CT Chest recommended if Age 12-80 years, 30 pack-year currently smoking OR have quit w/in 15years.) does not qualify.   Hepatitis C Screening: Completed   Vision Screening: Recommended annual ophthalmology exams for early detection of glaucoma and other disorders of the eye.  Dental Screening: Recommended annual dental exams for proper oral hygiene  Community Resource Referral / Chronic Care Management: CRR required this visit?  No   CCM required this visit?  No      Plan:     I have personally  reviewed and noted the following in the patient's chart:   Medical and social history Use of alcohol, tobacco or illicit drugs  Current medications and supplements including opioid prescriptions. Patient is not currently taking opioid prescriptions. Functional ability and status Nutritional  status Physical activity Advanced directives List of other physicians Hospitalizations, surgeries, and ER visits in previous 12 months Vitals Screenings to include cognitive, depression, and falls Referrals and appointments  In addition, I have reviewed and discussed with patient certain preventive protocols, quality metrics, and best practice recommendations. A written personalized care plan for preventive services as well as general preventive health recommendations were provided to patient.     Ashok Pall, LPN   1/61/0960

## 2021-07-01 ENCOUNTER — Encounter: Payer: Self-pay | Admitting: *Deleted

## 2021-07-02 ENCOUNTER — Ambulatory Visit: Payer: PPO | Admitting: Anesthesiology

## 2021-07-02 ENCOUNTER — Encounter
Admission: RE | Disposition: A | Discharge status: Home / Self Care | Payer: Self-pay | Source: Home / Self Care | Attending: Gastroenterology

## 2021-07-02 ENCOUNTER — Encounter: Payer: Self-pay | Admitting: Gastroenterology

## 2021-07-02 ENCOUNTER — Ambulatory Visit
Admission: RE | Admit: 2021-07-02 | Discharge: 2021-07-02 | Disposition: A | Discharge status: Home / Self Care | Payer: PPO | Attending: Gastroenterology | Admitting: Gastroenterology

## 2021-07-02 DIAGNOSIS — Z1211 Encounter for screening for malignant neoplasm of colon: Secondary | ICD-10-CM | POA: Diagnosis not present

## 2021-07-02 DIAGNOSIS — Z86010 Personal history of colonic polyps: Secondary | ICD-10-CM | POA: Insufficient documentation

## 2021-07-02 DIAGNOSIS — Z8616 Personal history of COVID-19: Secondary | ICD-10-CM | POA: Insufficient documentation

## 2021-07-02 DIAGNOSIS — K2270 Barrett's esophagus without dysplasia: Secondary | ICD-10-CM | POA: Insufficient documentation

## 2021-07-02 DIAGNOSIS — K219 Gastro-esophageal reflux disease without esophagitis: Secondary | ICD-10-CM | POA: Diagnosis not present

## 2021-07-02 DIAGNOSIS — K449 Diaphragmatic hernia without obstruction or gangrene: Secondary | ICD-10-CM | POA: Diagnosis not present

## 2021-07-02 DIAGNOSIS — G4730 Sleep apnea, unspecified: Secondary | ICD-10-CM | POA: Insufficient documentation

## 2021-07-02 DIAGNOSIS — Z79899 Other long term (current) drug therapy: Secondary | ICD-10-CM | POA: Insufficient documentation

## 2021-07-02 DIAGNOSIS — I10 Essential (primary) hypertension: Secondary | ICD-10-CM | POA: Insufficient documentation

## 2021-07-02 HISTORY — PX: ESOPHAGOGASTRODUODENOSCOPY (EGD) WITH PROPOFOL: SHX5813

## 2021-07-02 HISTORY — PX: COLONOSCOPY WITH PROPOFOL: SHX5780

## 2021-07-02 SURGERY — COLONOSCOPY WITH PROPOFOL
Anesthesia: General

## 2021-07-02 MED ORDER — SODIUM CHLORIDE 0.9 % IV SOLN: INTRAVENOUS | Status: DC

## 2021-07-02 MED ORDER — LIDOCAINE HCL (CARDIAC) PF 100 MG/5ML IV SOSY
PREFILLED_SYRINGE | INTRAVENOUS | Status: DC | PRN
  Administered 2021-07-02: 100 mg via INTRAVENOUS

## 2021-07-02 MED ORDER — LIDOCAINE HCL (PF) 2 % IJ SOLN
INTRAMUSCULAR | Status: AC
  Filled 2021-07-02: qty 5

## 2021-07-02 MED ORDER — PROPOFOL 10 MG/ML IV BOLUS
INTRAVENOUS | Status: AC
  Filled 2021-07-02: qty 20

## 2021-07-02 MED ORDER — PROPOFOL 10 MG/ML IV BOLUS
INTRAVENOUS | Status: DC | PRN
  Administered 2021-07-02: 100 mg via INTRAVENOUS
  Administered 2021-07-02: 160 ug/kg/min via INTRAVENOUS

## 2021-07-02 MED ORDER — SIMETHICONE 40 MG/0.6ML PO SUSP
ORAL | Status: DC | PRN
  Administered 2021-07-02: 60 mL

## 2021-07-02 NOTE — Op Note (Signed)
[facility] ?Gastroenterology ?Patient Name: [name] [name] ?Procedure Date: 07/02/2021 9:44 AM ?MRN: [id number] ?Account #: [id number] ?Date of Birth: [birth year] ?Admit Type: Outpatient ?Age: 71 ?Room: [facility] ENDO ROOM 1 ?Gender: Male ?Note Status: Finalized ?Instrument Name: Upper Endoscope [id number] ?Procedure:             Upper GI endoscopy ?Indications:           Gastro-esophageal reflux disease, Barrett's esophagus ?Providers:             Andrey Farmer MD, MD ?Referring MD:          Nino Glow Mclean-Scocuzza MD, MD (Referring MD) ?Medicines:             Monitored Anesthesia Care ?Complications:         No immediate complications. Estimated blood loss:  ?                       Minimal. ?Procedure:             Pre-Anesthesia Assessment: ?                       - Prior to the procedure, a History and Physical was  ?                       performed, and patient medications and allergies were  ?                       reviewed. The patient is competent. The risks and  ?                       benefits of the procedure and the sedation options and  ?                       risks were discussed with the patient. All questions  ?                       were answered and informed consent was obtained.  ?                       Patient identification and proposed procedure were  ?                       verified by the physician, the nurse, the  ?                       anesthesiologist, the anesthetist and the technician  ?                       in the endoscopy suite. Mental Status Examination:  ?                       alert and oriented. Airway Examination: normal  ?                       oropharyngeal airway and neck mobility. Respiratory  ?                       Examination: clear to auscultation. CV Examination:  ?  normal. Prophylactic Antibiotics: The patient does not  ?                       require prophylactic antibiotics. Prior  ?                       Anticoagulants: The patient has  taken no previous  ?                       anticoagulant or antiplatelet agents. ASA Grade  ?                       Assessment: II - A patient with mild systemic disease.  ?                       After reviewing the risks and benefits, the patient  ?                       was deemed in satisfactory condition to undergo the  ?                       procedure. The anesthesia plan was to use monitored  ?                       anesthesia care (MAC). Immediately prior to  ?                       administration of medications, the patient was  ?                       re-assessed for adequacy to receive sedatives. The  ?                       heart rate, respiratory rate, oxygen saturations,  ?                       blood pressure, adequacy of pulmonary ventilation, and  ?                       response to care were monitored throughout the  ?                       procedure. The physical status of the patient was  ?                       re-assessed after the procedure. ?                       After obtaining informed consent, the endoscope was  ?                       passed under direct vision. Throughout the procedure,  ?                       the patient's blood pressure, pulse, and oxygen  ?                       saturations were monitored continuously. The Endoscope  ?  was introduced through the mouth, and advanced to the  ?                       second part of duodenum. The upper GI endoscopy was  ?                       accomplished without difficulty. The patient tolerated  ?                       the procedure well. ?Findings: ?     A 2 cm hiatal hernia was present. ?     There were esophageal mucosal changes classified as Barrett's stage  ?     C5-M5 per Prague criteria present in the lower third of the esophagus.  ?     The maximum longitudinal extent of these mucosal changes was 5 cm in  ?     length. Mucosa was biopsied with a cold forceps for histology in 4  ?     quadrants at  intervals of 2 cm in the lower third of the esophagus. A  ?     total of 3 specimen bottles were sent to pathology. Estimated blood loss  ?     was minimal. ?     The entire examined stomach was normal. ?     The examined duodenum was normal. ?Impression:            - 2 cm hiatal hernia. ?                       - Esophageal mucosal changes classified as Barrett's  ?                       stage C5-M5 per Prague criteria. Biopsied. ?                       - Normal stomach. ?                       - Normal examined duodenum. ?Recommendation:        - Perform a colonoscopy today. ?                       - Await pathology results. ?Procedure Code(s):     --- Professional --- ?                       [phone], Esophagogastroduodenoscopy, flexible,  ?                       transoral; with biopsy, single or multiple ?Diagnosis Code(s):     --- Professional --- ?                       K44.9, Diaphragmatic hernia without obstruction or  ?                       gangrene ?                       K22.70, Barrett's esophagus without dysplasia ?                       K21.9, Gastro-esophageal reflux  disease without  ?                       esophagitis ?CPT copyright 2019 American Medical Association. All rights reserved. ?The codes documented in this report are preliminary and upon coder review may  ?be revised to meet current compliance requirements. ?Andrey Farmer MD, MD ?07/02/2021 10:42:06 AM ?Number of Addenda: 0 ?Note Initiated On: 07/02/2021 9:44 AM ?Estimated Blood Loss:  Estimated blood loss was minimal. ?     [facility] ?

## 2021-07-02 NOTE — Anesthesia Postprocedure Evaluation (Signed)
Anesthesia Post Note ? ?Patient: [name] [name] ? ?Procedure(s) Performed: COLONOSCOPY WITH PROPOFOL ?ESOPHAGOGASTRODUODENOSCOPY (EGD) WITH PROPOFOL ? ?Patient location during evaluation: Endoscopy ?Anesthesia Type: General ?Level of consciousness: awake and alert ?Pain management: pain level controlled ?Vital Signs Assessment: post-procedure vital signs reviewed and stable ?Respiratory status: spontaneous breathing, nonlabored ventilation and respiratory function stable ?Cardiovascular status: blood pressure returned to baseline and stable ?Postop Assessment: no apparent nausea or vomiting ?Anesthetic complications: no ? ? ?No notable events documented. ? ? ?Last Vitals:  ?Vitals:  ? 07/02/21 1046 07/02/21 1056  ?BP: 110/64 125/71  ?Pulse:    ?Resp:    ?Temp:    ?SpO2:    ?  ?Last Pain:  ?Vitals:  ? 07/02/21 1056  ?TempSrc:   ?PainSc: 0-No pain  ? ? ?  ?  ?  ?  ?  ?  ? ?[place] Ouch ? ? ? ? ?

## 2021-07-02 NOTE — Interval H&P Note (Signed)
History and Physical Interval Note: ? ?07/02/2021 ?9:54 AM ? ?[name] [name]  has presented today for surgery, with the diagnosis of hx of adenomatous polyp of colon,barrett's esophagus.  The various methods of treatment have been discussed with the patient and family. After consideration of risks, benefits and other options for treatment, the patient has consented to  Procedure(s): ?COLONOSCOPY WITH PROPOFOL (N/A) ?ESOPHAGOGASTRODUODENOSCOPY (EGD) WITH PROPOFOL (N/A) as a surgical intervention.  The patient's history has been reviewed, patient examined, no change in status, stable for surgery.  I have reviewed the patient's chart and labs.  Questions were answered to the patient's satisfaction.   ? ? ?Hilton Cork [name] ? ?Ok to proceed with EGD/Colonoscopy ?

## 2021-07-02 NOTE — H&P (Signed)
Outpatient short stay form Pre-procedure ?07/02/2021  ?[name], MD ? ?Primary Physician: McLean-Scocuzza, Nino Glow, MD ? ?Reason for visit:  BE's and history of polyps ? ?History of present illness:   ? ?71 y/o gentleman with history of GERD and hypertension here for EGD/Colonoscopy for history of BE and history of adenomatous polyps with last colonoscopy being 3 years ago. No blood thinners. No family history of GI malignancies. No significant abdominal surgeries. ? ? ? ?Current Facility-Administered Medications:  ?  0.9 %  sodium chloride infusion, , Intravenous, Continuous, Tarris Delbene, Hilton Cork, MD, Last Rate: 20 mL/hr at 07/02/21 0916, New Bag at 07/02/21 0916 ? ?Medications Prior to Admission  ?Medication Sig Dispense Refill Last Dose  ? amLODipine (NORVASC) 10 MG tablet Take 1 tablet (10 mg total) by mouth daily. 90 tablet 3 07/02/2021  ? RABEprazole (ACIPHEX) 20 MG tablet Take 1 tablet (20 mg total) by mouth daily. 30 min before breakfast or dinner 90 tablet 3 07/02/2021  ? pravastatin (PRAVACHOL) 20 MG tablet Take 1 tablet (20 mg total) by mouth daily. After 6 pm 90 tablet 3   ? tamsulosin (FLOMAX) 0.4 MG CAPS capsule TAKE 1 CAPSULE BY MOUTH ONCE DAILY 90 capsule 3   ? ? ? ?Allergies  ?Allergen Reactions  ? Omeprazole Diarrhea  ? Statins Other (See Comments)  ?  Fatigue,muscle aches ?  ? ? ? ?Past Medical History:  ?Diagnosis Date  ? Blind   ? partially in right eye with scarring and some scarring in left eye but not blind   ? COVID-19   ? 03/2020  ? Depression   ? remote h/o suicide ideation   ? GERD (gastroesophageal reflux disease)   ? Hypertension   ? Sleep apnea   ? ? ?Review of systems:  Otherwise negative.  ? ? ?Physical Exam ? ?Gen: Alert, oriented. Appears stated age.  ?HEENT: PERRLA. ?Lungs: No respiratory distress ?CV: RRR ?Abd: soft, benign, no masses ?Ext: No edema ? ? ? ?Planned procedures: Proceed with colonoscopy. The patient understands the nature of the planned procedure, indications,  risks, alternatives and potential complications including but not limited to bleeding, infection, perforation, damage to internal organs and possible oversedation/side effects from anesthesia. The patient agrees and gives consent to proceed.  ?Please refer to procedure notes for findings, recommendations and patient disposition/instructions.  ? ? ? ?[name], MD ?[facility] ? ? ? ?  ? ?

## 2021-07-02 NOTE — Op Note (Signed)
[facility] ?Gastroenterology ?Patient Name: [name] [name] ?Procedure Date: 07/02/2021 9:44 AM ?MRN: [id number] ?Account #: [id number] ?Date of Birth: Jun 09, 1950 ?Admit Type: Outpatient ?Age: 71 ?Room: [facility] ENDO ROOM 1 ?Gender: Male ?Note Status: Finalized ?Instrument Name: Colonscope [id number] ?Procedure:             Colonoscopy ?Indications:           Surveillance: Personal history of adenomatous polyps  ?                       on last colonoscopy 3 years ago ?Providers:             Andrey Farmer MD, MD ?Referring MD:          Nino Glow Mclean-Scocuzza MD, MD (Referring MD) ?Medicines:             Monitored Anesthesia Care ?Complications:         No immediate complications. ?Procedure:             Pre-Anesthesia Assessment: ?                       - Prior to the procedure, a History and Physical was  ?                       performed, and patient medications and allergies were  ?                       reviewed. The patient is competent. The risks and  ?                       benefits of the procedure and the sedation options and  ?                       risks were discussed with the patient. All questions  ?                       were answered and informed consent was obtained.  ?                       Patient identification and proposed procedure were  ?                       verified by the physician, the nurse, the  ?                       anesthesiologist, the anesthetist and the technician  ?                       in the endoscopy suite. Mental Status Examination:  ?                       alert and oriented. Airway Examination: normal  ?                       oropharyngeal airway and neck mobility. Respiratory  ?                       Examination: clear to auscultation. CV Examination:  ?  normal. Prophylactic Antibiotics: The patient does not  ?                       require prophylactic antibiotics. Prior  ?                       Anticoagulants: The patient has taken no  previous  ?                       anticoagulant or antiplatelet agents. ASA Grade  ?                       Assessment: II - A patient with mild systemic disease.  ?                       After reviewing the risks and benefits, the patient  ?                       was deemed in satisfactory condition to undergo the  ?                       procedure. The anesthesia plan was to use monitored  ?                       anesthesia care (MAC). Immediately prior to  ?                       administration of medications, the patient was  ?                       re-assessed for adequacy to receive sedatives. The  ?                       heart rate, respiratory rate, oxygen saturations,  ?                       blood pressure, adequacy of pulmonary ventilation, and  ?                       response to care were monitored throughout the  ?                       procedure. The physical status of the patient was  ?                       re-assessed after the procedure. ?                       After obtaining informed consent, the colonoscope was  ?                       passed under direct vision. Throughout the procedure,  ?                       the patient's blood pressure, pulse, and oxygen  ?                       saturations were monitored continuously. The  ?  Colonoscope was introduced through the anus and  ?                       advanced to the the cecum, identified by appendiceal  ?                       orifice and ileocecal valve. The colonoscopy was  ?                       performed without difficulty. The patient tolerated  ?                       the procedure well. The quality of the bowel  ?                       preparation was good. ?Findings: ?     The perianal and digital rectal examinations were normal. ?     The entire examined colon appeared normal on direct and retroflexion  ?     views. ?Impression:            - The entire examined colon is normal on direct and  ?                        retroflexion views. ?                       - No specimens collected. ?Recommendation:        - Discharge patient to home. ?                       - Resume previous diet. ?                       - Continue present medications. ?                       - Repeat colonoscopy in 5 years for surveillance. ?                       - Return to referring physician as previously  ?                       scheduled. ?Procedure Code(s):     --- Professional --- ?                       G0105, Colorectal cancer screening; colonoscopy on  ?                       individual at high risk ?Diagnosis Code(s):     --- Professional --- ?                       Z86.010, Personal history of colonic polyps ?CPT copyright 2019 American Medical Association. All rights reserved. ?The codes documented in this report are preliminary and upon coder review may  ?be revised to meet current compliance requirements. ?Andrey Farmer MD, MD ?07/02/2021 10:45:13 AM ?Number of Addenda: 0 ?Note Initiated On: 07/02/2021 9:44 AM ?Scope Withdrawal Time: 0 hours 7 minutes 54 seconds  ?Total Procedure Duration: 0 hours 13 minutes 58 seconds  ?Estimated Blood Loss:  Estimated  blood loss: none. ?     [facility] ?

## 2021-07-02 NOTE — Anesthesia Preprocedure Evaluation (Addendum)
Anesthesia Evaluation  ?Patient identified by MRN, date of birth, ID band ?Patient awake ? ? ? ?Reviewed: ?Allergy & Precautions, H&P , NPO status , Patient's Chart, lab work & pertinent test results ? ?Airway ?Mallampati: III ? ? ? ? ? ? Dental ? ?(+) Chipped ?  ?Pulmonary ?sleep apnea and Continuous Positive Airway Pressure Ventilation ,  ?  ?Pulmonary exam normal ? ? ? ? ? ? ? Cardiovascular ?Exercise Tolerance: Good ?hypertension,  ?Rhythm:Regular Rate:Normal ? ?"Heart attack" and "heart failure" diagnoses listed in patient history, however pt denies these and there is no mention of this in recent cardiology note.  Very good exercise tolerance ?  ?Neuro/Psych ?PSYCHIATRIC DISORDERS Depression negative neurological ROS ?   ? GI/Hepatic ?negative GI ROS, Neg liver ROS, GERD  Controlled,  ?Endo/Other  ?negative endocrine ROS ? Renal/GU ?negative Renal ROS  ?negative genitourinary ?  ?Musculoskeletal ? ? Abdominal ?Normal abdominal exam  (+)   ?Peds ? Hematology ?negative hematology ROS ?(+)   ?Anesthesia Other Findings ?Past Medical History: ?No date: Depression ?No date: GERD (gastroesophageal reflux disease) ?No date: Heart attack [facility]) ?No date: Heart failure ([place]) ?No date: Hypertension ?No date: Sleep apnea ? ?Past Surgical History: ?1980: HEMORROIDECTOMY ?No date: VASECTOMY ? ?BMI   ? Body Mass Index:  25.25 kg/m?  ?  ? ? Reproductive/Obstetrics ?negative OB ROS ? ?  ? ? ? ? ? ? ? ? ? ? ? ? ? ?  ?  ? ? ? ? ? ? ? ?Anesthesia Physical ? ?Anesthesia Plan ? ?ASA: II ? ?Anesthesia Plan: General  ? ?Post-op Pain Management:   ? ?Induction:  ? ?PONV Risk Score and Plan: Propofol infusion and TIVA ? ?Airway Management Planned: Natural Airway and Nasal Cannula ? ?Additional Equipment:  ? ?Intra-op Plan:  ? ?Post-operative Plan:  ? ?Informed Consent: I have reviewed the patients History and Physical, chart, labs and discussed the procedure including the risks, benefits and alternatives  for the proposed anesthesia with the patient or authorized representative who has indicated his/her understanding and acceptance.  ? ? ? ?Dental advisory given ? ?Plan Discussed with: Anesthesiologist, CRNA and Surgeon ? ?Anesthesia Plan Comments:   ? ? ? ? ? ?Anesthesia Quick Evaluation ? ?

## 2021-07-02 NOTE — Transfer of Care (Signed)
Immediate Anesthesia Transfer of Care Note ? ?Patient: [name] [name] ? ?Procedure(s) Performed: COLONOSCOPY WITH PROPOFOL ?ESOPHAGOGASTRODUODENOSCOPY (EGD) WITH PROPOFOL ? ?Patient Location: PACU ? ?Anesthesia Type:General ? ?Level of Consciousness: drowsy ? ?Airway & Oxygen Therapy: Patient Spontanous Breathing ? ?Post-op Assessment: Report given to RN and Post -op Vital signs reviewed and stable ? ?Post vital signs: Reviewed and stable ? ?Last Vitals:  ?Vitals Value Taken Time  ?BP 108/65 07/02/21 1036  ?Temp 36.2 ?C 07/02/21 1036  ?Pulse 60 07/02/21 1038  ?Resp 11 07/02/21 1038  ?SpO2 99 % 07/02/21 1038  ?Vitals shown include unvalidated device data. ? ?Last Pain:  ?Vitals:  ? 07/02/21 1036  ?TempSrc: Temporal  ?PainSc: Asleep  ?   ? ?  ? ?Complications: No notable events documented. ?

## 2021-07-03 LAB — SURGICAL PATHOLOGY

## 2021-07-04 ENCOUNTER — Telehealth: Payer: Self-pay | Admitting: Internal Medicine

## 2021-07-04 NOTE — Telephone Encounter (Signed)
Patient called. Lab and follow up appointments have been made. At this time no medical concerns. ?

## 2021-07-04 NOTE — Telephone Encounter (Signed)
Okay to schedule fasting lab appointment as well.  ?

## 2021-07-04 NOTE — Telephone Encounter (Signed)
Left message to return call. ?Patient needs to be scheduled for an appointment with Dr Olivia Mackie McLean-Scocuzza if having new or worsening medical concerns.  ? ?Okay to schedule appointment with Dr Olivia Mackie McLean-Scocuzza.  ?

## 2021-07-04 NOTE — Telephone Encounter (Signed)
Pt wife called in requesting for lab orders for pt.... There are past lab orders in system... Pt wife stated that she is concerned about pt cause he is not doing well.... Pt wife requesting callback...  ?

## 2021-07-15 ENCOUNTER — Telehealth: Payer: Self-pay | Admitting: Internal Medicine

## 2021-07-15 NOTE — Telephone Encounter (Signed)
Patient's spouse asking for a call back regarding labs. She wants to know if certain things will be address with the lab work.  ?

## 2021-07-16 ENCOUNTER — Ambulatory Visit (INDEPENDENT_AMBULATORY_CARE_PROVIDER_SITE_OTHER): Payer: PPO | Admitting: Internal Medicine

## 2021-07-16 ENCOUNTER — Encounter: Payer: Self-pay | Admitting: Internal Medicine

## 2021-07-16 VITALS — BP 150/80 | HR 55 | Temp 98.4°F | Resp 14 | Ht 71.0 in | Wt 168.2 lb

## 2021-07-16 DIAGNOSIS — B360 Pityriasis versicolor: Secondary | ICD-10-CM | POA: Diagnosis not present

## 2021-07-16 DIAGNOSIS — L570 Actinic keratosis: Secondary | ICD-10-CM | POA: Diagnosis not present

## 2021-07-16 DIAGNOSIS — G4733 Obstructive sleep apnea (adult) (pediatric): Secondary | ICD-10-CM | POA: Diagnosis not present

## 2021-07-16 DIAGNOSIS — D2271 Melanocytic nevi of right lower limb, including hip: Secondary | ICD-10-CM | POA: Diagnosis not present

## 2021-07-16 DIAGNOSIS — D2272 Melanocytic nevi of left lower limb, including hip: Secondary | ICD-10-CM | POA: Diagnosis not present

## 2021-07-16 DIAGNOSIS — I10 Essential (primary) hypertension: Secondary | ICD-10-CM | POA: Diagnosis not present

## 2021-07-16 DIAGNOSIS — R6889 Other general symptoms and signs: Secondary | ICD-10-CM | POA: Diagnosis not present

## 2021-07-16 DIAGNOSIS — E611 Iron deficiency: Secondary | ICD-10-CM

## 2021-07-16 DIAGNOSIS — D225 Melanocytic nevi of trunk: Secondary | ICD-10-CM | POA: Diagnosis not present

## 2021-07-16 DIAGNOSIS — E559 Vitamin D deficiency, unspecified: Secondary | ICD-10-CM

## 2021-07-16 DIAGNOSIS — E538 Deficiency of other specified B group vitamins: Secondary | ICD-10-CM | POA: Diagnosis not present

## 2021-07-16 DIAGNOSIS — R5383 Other fatigue: Secondary | ICD-10-CM

## 2021-07-16 DIAGNOSIS — D2262 Melanocytic nevi of left upper limb, including shoulder: Secondary | ICD-10-CM | POA: Diagnosis not present

## 2021-07-16 DIAGNOSIS — D2261 Melanocytic nevi of right upper limb, including shoulder: Secondary | ICD-10-CM | POA: Diagnosis not present

## 2021-07-16 DIAGNOSIS — X32XXXA Exposure to sunlight, initial encounter: Secondary | ICD-10-CM | POA: Diagnosis not present

## 2021-07-16 DIAGNOSIS — L821 Other seborrheic keratosis: Secondary | ICD-10-CM | POA: Diagnosis not present

## 2021-07-16 LAB — COMPREHENSIVE METABOLIC PANEL
ALT: 13 U/L (ref 0–53)
AST: 15 U/L (ref 0–37)
Albumin: 4.6 g/dL (ref 3.5–5.2)
Alkaline Phosphatase: 68 U/L (ref 39–117)
BUN: 20 mg/dL (ref 6–23)
CO2: 30 mEq/L (ref 19–32)
Calcium: 9.3 mg/dL (ref 8.4–10.5)
Chloride: 103 mEq/L (ref 96–112)
Creatinine, Ser: 0.91 mg/dL (ref 0.40–1.50)
GFR: 85.44 mL/min (ref >60.00)
Glucose, Bld: 83 mg/dL (ref 70–99)
Potassium: 4.6 mEq/L (ref 3.5–5.1)
Sodium: 139 mEq/L (ref 135–145)
Total Bilirubin: 0.7 mg/dL (ref 0.2–1.2)
Total Protein: 7.1 g/dL (ref 6.0–8.3)

## 2021-07-16 LAB — CBC WITH DIFFERENTIAL/PLATELET
Basophils Absolute: 0 10*3/uL (ref 0.0–0.1)
Basophils Relative: 0.7 % (ref 0.0–3.0)
Eosinophils Absolute: 0 10*3/uL (ref 0.0–0.7)
Eosinophils Relative: 0.8 % (ref 0.0–5.0)
HCT: 42.5 % (ref 39.0–52.0)
Hemoglobin: 14.8 g/dL (ref 13.0–17.0)
Lymphocytes Relative: 25.5 % (ref 12.0–46.0)
Lymphs Abs: 1.4 10*3/uL (ref 0.7–4.0)
MCHC: 34.8 g/dL (ref 30.0–36.0)
MCV: 95.3 fl (ref 78.0–100.0)
Monocytes Absolute: 0.4 10*3/uL (ref 0.1–1.0)
Monocytes Relative: 6.9 % (ref 3.0–12.0)
Neutro Abs: 3.6 10*3/uL (ref 1.4–7.7)
Neutrophils Relative %: 66.1 % (ref 43.0–77.0)
Platelets: 245 10*3/uL (ref 150.0–400.0)
RBC: 4.46 Mil/uL (ref 4.22–5.81)
RDW: 12.4 % (ref 11.5–15.5)
WBC: 5.5 10*3/uL (ref 4.0–10.5)

## 2021-07-16 LAB — IBC + FERRITIN
Ferritin: 22.3 ng/mL (ref 22.0–322.0)
Iron: 133 ug/dL (ref 42–165)
Saturation Ratios: 37.4 % (ref 20.0–50.0)
TIBC: 355.6 ug/dL (ref 250.0–450.0)
Transferrin: 254 mg/dL (ref 212.0–360.0)

## 2021-07-16 LAB — VITAMIN B12: Vitamin B-12: 1265 pg/mL — ABNORMAL HIGH (ref 211–911)

## 2021-07-16 LAB — TSH: TSH: 4.81 u[IU]/mL (ref 0.35–5.50)

## 2021-07-16 LAB — VITAMIN D 25 HYDROXY (VIT D DEFICIENCY, FRACTURES): VITD: 31.92 ng/mL (ref 30.00–100.00)

## 2021-07-16 NOTE — Progress Notes (Addendum)
Chief Complaint  Patient presents with   Acute Visit    Pt arrives to clinic c/o fatigue,feeling more sleepy than usual,brain fog having hard time remembering things, & feeling cold all the time.    F/u  C/o brain fog or short term memory loss I.e thinking of something and cant say it or vs versa, h/a at times, his hands are cold and fatigue. He falls asleep with watching tv h/o osa sleep study 12/2014 does not wear cpap Denies snoring for now and declines cpap or repeat sleep study     Review of Systems  Constitutional:  Negative for weight loss.  HENT:  Negative for hearing loss.   Eyes:  Negative for blurred vision.  Respiratory:  Negative for shortness of breath.   Cardiovascular:  Negative for chest pain.  Gastrointestinal:  Negative for abdominal pain and blood in stool.  Musculoskeletal:  Negative for back pain.  Skin:  Negative for rash.  Neurological:  Positive for headaches.  Psychiatric/Behavioral:  Positive for memory loss. Negative for depression.   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 and some scarring in left eye but not blind    COVID-19    03/2020   Depression    remote h/o suicide ideation    GERD (gastroesophageal reflux disease)    Hypertension    Sleep apnea    Past Surgical History:  Procedure Laterality Date   COLONOSCOPY WITH PROPOFOL N/A 04/01/2018   Procedure: COLONOSCOPY WITH PROPOFOL;  Surgeon: Lollie Sails, MD;  Location[facility] ENDOSCOPY;  Service: Endoscopy;  Laterality: N/A;   COLONOSCOPY WITH PROPOFOL N/A 07/02/2021   Procedure: COLONOSCOPY WITH PROPOFOL;  Surgeon: [name], MD;  Location: [facility] ENDOSCOPY;  Service: Endoscopy;  Laterality: N/A;   ESOPHAGOGASTRODUODENOSCOPY N/A 04/01/2018   Procedure: ESOPHAGOGASTRODUODENOSCOPY (EGD);  Surgeon: Lollie Sails, MD;  Location: [facility] ENDOSCOPY;  Service: Endoscopy;  Laterality: N/A;   ESOPHAGOGASTRODUODENOSCOPY (EGD) WITH PROPOFOL N/A  07/02/2021   Procedure: ESOPHAGOGASTRODUODENOSCOPY (EGD) WITH PROPOFOL;  Surgeon: [name], MD;  Location: [facility] ENDOSCOPY;  Service: Endoscopy;  Laterality: N/A;   HEMORROIDECTOMY  1980   VASECTOMY     Family History  Problem Relation Age of Onset   Dementia Mother    Stroke Mother        10s   Cancer Father        colon cancer   Stroke Father    Heart attack Father        h/o cig smoker    Prostate cancer Father        ?   Heart disease Father    Other Father        smoker   Cancer Brother        colon cancer dx'ed age 72 as of 06/2021   Esophageal cancer Brother    Heart attack Paternal Grandmother    Social History   Socioeconomic History   Marital status: Married    Spouse name: Theatre manager   Number of children: 2   Years of education: 12   Highest education level: Associate degree: academic program  Occupational History   Occupation: retired  Tobacco Use   Smoking status: Never   Smokeless tobacco: Never  Vaping Use   Vaping Use: Never used  Substance and Sexual Activity   Alcohol use: Yes    Alcohol/week: 0.0 standard drinks   Drug use: No   Sexual activity: Yes  Partners: Female  Other Topics Concern   Not on file  Social History Narrative   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 Orry Sigl    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, radio    Never smoker    Wears eye glasses   Social Determinants of Radio broadcast assistant Strain: Low Risk    Difficulty of Paying Living Expenses: Not hard at all  Food Insecurity: No Food Insecurity   Worried About Charity fundraiser in the Last Year: Never true   Arboriculturist in the Last Year: Never true  Transportation Needs: No Transportation Needs   Lack of Transportation (Medical): No   Lack of Transportation (Non-Medical): No  Physical Activity: Sufficiently Active   Days of Exercise per Week: 5 days   Minutes of Exercise per Session: 60 min  Stress: No Stress Concern Present   Feeling of Stress : Not at  all  Social Connections: Socially Integrated   Frequency of Communication with Friends and Family: Three times a week   Frequency of Social Gatherings with Friends and Family: Three times a week   Attends Religious Services: More than 4 times per year   Active Member of Clubs or Organizations: Yes   Attends Music therapist: More than 4 times per year   Marital Status: Married  Human resources officer Violence: Not At Risk   Fear of Current or Ex-Partner: No   Emotionally Abused: No   Physically Abused: No   Sexually Abused: No   Current Meds  Medication Sig   amLODipine (NORVASC) 10 MG tablet Take 1 tablet (10 mg total) by mouth daily.   RABEprazole (ACIPHEX) 20 MG tablet Take 1 tablet (20 mg total) by mouth daily. 30 min before breakfast or dinner   tamsulosin (FLOMAX) 0.4 MG CAPS capsule TAKE 1 CAPSULE BY MOUTH ONCE DAILY   Allergies  Allergen Reactions   Omeprazole Diarrhea   Statins Other (See Comments)    Fatigue,muscle aches    Recent Results (from the past 2160 hour(s))  Surgical pathology     Status: None   Collection Time: 07/02/21 10:08 AM  Result Value Ref Range   SURGICAL PATHOLOGY      SURGICAL PATHOLOGY CASE: [id number] PATIENT: [facility] Surgical Pathology Report     Specimen Submitted: A. Esophagus, 40 cm; cbx B. Esophagus, 38 cm; cbx C. Esophagus, 36 cm; cbx  Clinical History: HX of adenomatous polyp of colon, Barrett's esophagus. Normal colonoscopy      DIAGNOSIS: A. ESOPHAGUS, 40 CM; COLD BIOPSY: - BARRETT'S ESOPHAGUS, FOCALLY INFLAMED. - NEGATIVE FOR DYSPLASIA AND MALIGNANCY. - DEEPER SECTIONS EXAMINED.  B.  ESOPHAGUS, 38 CM; COLD BIOPSY: - BARRETT'S ESOPHAGUS, FOCALLY INFLAMED. - NEGATIVE FOR DYSPLASIA AND MALIGNANCY. - DEEPER SECTIONS EXAMINED.  C.  ESOPHAGUS, 36 CM; COLD BIOPSY: - BARRETT'S ESOPHAGUS, FOCALLY INFLAMED. - INDEFINITE FOR DYSPLASIA. - NEGATIVE FOR MALIGNANCY. - DEEPER SECTIONS  EXAMINED.  Comment: Intradepartmental consultation was performed.  GROSS DESCRIPTION: A. Labeled: cbx esophagus 40 cm rule out dysplasia Received: Formalin Collection time: 10:08 AM on 07/02/2021 Placed into formalin time: 1 0:08 AM on 07/02/2021 Tissue fragment(s): 3 Size: Aggregate, 0.9 x 0.3 x 0.2 cm Description: Pink soft tissue fragments Entirely submitted in 1 cassette.  B. Labeled: cbx esophagus 38 cm rule out dysplasia Received: Formalin Collection time: 10:10 AM on 07/02/2021 Placed into formalin time: 10:10 AM on 07/02/2021 Tissue fragment(s): Multiple Size: Aggregate, 0.7 x 0.5 x 0.2 cm Description: Received are fragments of  tan-pink soft tissue.  The smallest fragments may not survive processing. Entirely submitted in 1 cassette.  C. Labeled: cbx esophagus 36 cm rule out dysplasia Received: Formalin Collection time: 10:12 AM on 07/02/2021 Placed into formalin time: 10:12 AM on 07/02/2021 Tissue fragment(s): Multiple Size: Aggregate, 1.5 x 0.4 x 0.2 cm Description: Received are fragments of tan-pink soft tissue.  The smallest fragments may not survive processing. Entirely submitted in 1 cassette.  RB 07/02/2021  Final Diagnosis performed by Quay Burow, MD.   Electronically sign ed 07/03/2021 4:32:30PM The electronic signature indicates that the named Attending Pathologist has evaluated the specimen Technical component performed at [facility], [address] [place], [place] [address] Lab: [phone] Dir: Rush Farmer, MD, MMM  Professional component performed at [facility], [facility], [place], [place],  [address] Lab: [phone] Dir: Kathi Simpers, MD    Objective  Body mass index is 23.46 kg/m. Wt Readings from Last 3 Encounters:  07/16/21 168 lb 3.2 oz (76.3 kg)  07/02/21 180 lb (81.6 kg)  06/25/21 171 lb (77.6 kg)   Temp Readings from Last 3 Encounters:  07/16/21 98.4 F (36.9 C) (Oral)  07/02/21 (!) 97.2 F (36.2 C)  (Temporal)  05/29/21 98.2 F (36.8 C) (Oral)   BP Readings from Last 3 Encounters:  07/16/21 (!) 150/80  07/02/21 125/71  05/29/21 116/70   Pulse Readings from Last 3 Encounters:  07/16/21 (!) 55  07/02/21 61  05/29/21 60    Physical Exam Vitals and nursing note reviewed.  Constitutional:      Appearance: Normal appearance. He is well-developed and well-groomed.  HENT:     Head: Normocephalic and atraumatic.  Eyes:     Conjunctiva/sclera: Conjunctivae normal.     Pupils: Pupils are equal, round, and reactive to light.  Cardiovascular:     Rate and Rhythm: Normal rate and regular rhythm.     Heart sounds: Normal heart sounds.  Pulmonary:     Effort: Pulmonary effort is normal. No respiratory distress.     Breath sounds: Normal breath sounds.  Abdominal:     Tenderness: There is no abdominal tenderness.  Skin:    General: Skin is warm and moist.  Neurological:     General: No focal deficit present.     Mental Status: He is alert and oriented to person, place, and time. Mental status is at baseline.     Sensory: Sensation is intact.     Motor: Motor function is intact.     Coordination: Coordination is intact.     Gait: Gait is intact. Gait normal.  Psychiatric:        Attention and Perception: Attention and perception normal.        Mood and Affect: Mood and affect normal.        Speech: Speech normal.        Behavior: Behavior normal. Behavior is cooperative.        Thought Content: Thought content normal.        Cognition and Memory: Cognition and memory normal.        Judgment: Judgment normal.    Assessment  Plan  Fatigue, h/o OSA not using cpap since 2016 declines - Plan: Comprehensive metabolic panel, CBC with Differential/Platelet, IBC + Ferritin, TSH  OSA (obstructive sleep apnea) See above   Cold intolerance/cold hands - Plan: TSH  B12 deficiency - Plan: Vitamin B12  Vitamin D deficiency - Plan: Vitamin D (25 hydroxy)  Iron deficiency - Plan: IBC  +  Ferritin  Hypertension, unspecified type  On norvasc 10 mg qd sl elevated today  Given bp log goal <130-140/<80  Consider add fluid pill in the future if needed for better bp control low dose hctz 1/2 of 12.5 in am Dash diet  He did have ham sandwich today  EGD 7/10/23bx barretts esophagus repeat egd in 6 months Dr. Burman Riis  A-F bxs D-F indefinite bx for dysplasia    Provider: Dr. Olivia Mackie McLean-Scocuzza-Internal Medicine

## 2021-07-16 NOTE — Patient Instructions (Addendum)
Sleep Apnea Sleep apnea is a condition in which breathing pauses or becomes shallow during sleep. People with sleep apnea usually snore loudly. They may have times when they gasp and stop breathing for 10 seconds or more during sleep. This may happen many times during the night. Sleep apnea disrupts your sleep and keeps your body from getting the rest that it needs. This condition can increase your risk of certain health problems, including: Heart attack. Stroke. Obesity. Type 2 diabetes. Heart failure. Irregular heartbeat. High blood pressure. The goal of treatment is to help you breathe normally again. What are the causes?  The most common cause of sleep apnea is a collapsed or blocked airway. There are three kinds of sleep apnea: Obstructive sleep apnea. This kind is caused by a blocked or collapsed airway. Central sleep apnea. This kind happens when the part of the brain that controls breathing does not send the correct signals to the muscles that control breathing. Mixed sleep apnea. This is a combination of obstructive and central sleep apnea. What increases the risk? You are more likely to develop this condition if you: Are overweight. Smoke. Have a smaller than normal airway. Are older. Are male. Drink alcohol. Take sedatives or tranquilizers. Have a family history of sleep apnea. Have a tongue or tonsils that are larger than normal. What are the signs or symptoms? Symptoms of this condition include: Trouble staying asleep. Loud snoring. Morning headaches. Waking up gasping. Dry mouth or sore throat in the morning. Daytime sleepiness and tiredness. If you have daytime fatigue because of sleep apnea, you may be more likely to have: Trouble concentrating. Forgetfulness. Irritability or mood swings. Personality changes. Feelings of depression. Sexual dysfunction. This may include loss of interest if you are male, or erectile dysfunction if you are male. How is this  diagnosed? This condition may be diagnosed with: A medical history. A physical exam. A series of tests that are done while you are sleeping (sleep study). These tests are usually done in a sleep lab, but they may also be done at home. How is this treated? Treatment for this condition aims to restore normal breathing and to ease symptoms during sleep. It may involve managing health issues that can affect breathing, such as high blood pressure or obesity. Treatment may include: Sleeping on your side. Using a decongestant if you have nasal congestion. Avoiding the use of depressants, including alcohol, sedatives, and narcotics. Losing weight if you are overweight. Making changes to your diet. Quitting smoking. Using a device to open your airway while you sleep, such as: An oral appliance. This is a custom-made mouthpiece that shifts your lower jaw forward. A continuous positive airway pressure (CPAP) device. This device blows air through a mask when you breathe out (exhale). A nasal expiratory positive airway pressure (EPAP) device. This device has valves that you put into each nostril. A bi-level positive airway pressure (BIPAP) device. This device blows air through a mask when you breathe in (inhale) and breathe out (exhale). Having surgery if other treatments do not work. During surgery, excess tissue is removed to create a wider airway. Follow these instructions at home: Lifestyle Make any lifestyle changes that your health care provider recommends. Eat a healthy, well-balanced diet. Take steps to lose weight if you are overweight. Avoid using depressants, including alcohol, sedatives, and narcotics. Do not use any products that contain nicotine or tobacco. These products include cigarettes, chewing tobacco, and vaping devices, such as e-cigarettes. If you need help quitting, ask   your health care provider. ?General instructions ?Take over-the-counter and prescription medicines only as told  by your health care provider. ?If you were given a device to open your airway while you sleep, use it only as told by your health care provider. ?If you are having surgery, make sure to tell your health care provider you have sleep apnea. You may need to bring your device with you. ?Keep all follow-up visits. This is important. ?Contact a health care provider if: ?The device that you received to open your airway during sleep is uncomfortable or does not seem to be working. ?Your symptoms do not improve. ?Your symptoms get worse. ?Get help right away if: ?You develop: ?Chest pain. ?Shortness of breath. ?Discomfort in your back, arms, or stomach. ?You have: ?Trouble speaking. ?Weakness on one side of your body. ?Drooping in your face. ?These symptoms may represent a serious problem that is an emergency. Do not wait to see if the symptoms will go away. Get medical help right away. Call your local emergency services (911 in the [place] Do not drive yourself to the hospital. ?Summary ?Sleep apnea is a condition in which breathing pauses or becomes shallow during sleep. ?The most common cause is a collapsed or blocked airway. ?The goal of treatment is to restore normal breathing and to ease symptoms during sleep. ?This information is not intended to replace advice given to you by your health care provider. Make sure you discuss any questions you have with your health care provider. ?Document Revised: 10/24/2020 Document Reviewed: 02/24/2020 ?Elsevier Patient Education ? [place]. ? ?Memory Compensation Strategies ? ?Use "WARM" strategy. ? W= write it down ? A= associate it ? R= repeat it ? M= make a mental note ? ?2.   You can keep a Social worker. ? Use a 3-ring notebook with sections for the following: calendar, important names and phone numbers,  medications, doctors' names/phone numbers, lists/reminders, and a section to journal what you did  each day.  ? ?3.    Use a calendar to write appointments  down. ? ?4.    Write yourself a schedule for the day. ? This can be placed on the calendar or in a separate section of the Memory Notebook.  Keeping a  regular schedule can help memory. ? ?5.    Use medication organizer with sections for each day or morning/evening pills. ? You may need help loading it ? ?6.    Keep a basket, or pegboard by the door. ? Place items that you need to take out with you in the basket or on the pegboard.  You may also want to  include a message board for reminders. ? ?7.    Use sticky notes. ? Place sticky notes with reminders in a place where the task is performed.  For example: " turn off the  stove" placed by the stove, "lock the door" placed on the door at eye level, " take your medications" on  the bathroom mirror or by the place where you normally take your medications. ? ?8.    Use alarms/timers. ? Use while cooking to remind yourself to check on food or as a reminder to take your medicine, or as a  reminder to make a call, or as a reminder to perform another task, etc. ? ?

## 2021-07-31 ENCOUNTER — Other Ambulatory Visit (INDEPENDENT_AMBULATORY_CARE_PROVIDER_SITE_OTHER): Payer: PPO

## 2021-07-31 ENCOUNTER — Telehealth: Payer: Self-pay

## 2021-07-31 DIAGNOSIS — R6889 Other general symptoms and signs: Secondary | ICD-10-CM | POA: Diagnosis not present

## 2021-07-31 DIAGNOSIS — I10 Essential (primary) hypertension: Secondary | ICD-10-CM | POA: Diagnosis not present

## 2021-07-31 DIAGNOSIS — E785 Hyperlipidemia, unspecified: Secondary | ICD-10-CM | POA: Diagnosis not present

## 2021-07-31 LAB — COMPREHENSIVE METABOLIC PANEL
ALT: 12 U/L (ref 0–53)
AST: 16 U/L (ref 0–37)
Albumin: 4.7 g/dL (ref 3.5–5.2)
Alkaline Phosphatase: 78 U/L (ref 39–117)
BUN: 20 mg/dL (ref 6–23)
CO2: 29 mEq/L (ref 19–32)
Calcium: 9.7 mg/dL (ref 8.4–10.5)
Chloride: 103 mEq/L (ref 96–112)
Creatinine, Ser: 1 mg/dL (ref 0.40–1.50)
GFR: 76.27 mL/min (ref >60.00)
Glucose, Bld: 91 mg/dL (ref 70–99)
Potassium: 4.6 mEq/L (ref 3.5–5.1)
Sodium: 139 mEq/L (ref 135–145)
Total Bilirubin: 0.7 mg/dL (ref 0.2–1.2)
Total Protein: 7.4 g/dL (ref 6.0–8.3)

## 2021-07-31 LAB — LIPID PANEL
Cholesterol: 218 mg/dL — ABNORMAL HIGH (ref 0–200)
HDL: 52.2 mg/dL (ref >39.00)
LDL Cholesterol: 149 mg/dL — ABNORMAL HIGH (ref 0–99)
NonHDL: 165.33
Total CHOL/HDL Ratio: 4
Triglycerides: 80 mg/dL (ref 0.0–149.0)
VLDL: 16 mg/dL (ref 0.0–40.0)

## 2021-07-31 LAB — TSH: TSH: 4.52 u[IU]/mL (ref 0.35–5.50)

## 2021-07-31 NOTE — Telephone Encounter (Signed)
Lvm for pt to return call in regards to labs.   ? ?Per Dr.Tracy: ?Cholesterol worse  ?Is pt taking pravachol 20 mg?  ?If so would he be ok to increase to 40 mg?  ?Mail cholesterol handout  ?Thyroid lab normal  ?Liver kidneys normal  ?

## 2021-08-01 ENCOUNTER — Other Ambulatory Visit: Payer: PPO

## 2021-10-02 ENCOUNTER — Other Ambulatory Visit: Payer: Self-pay | Admitting: Urology

## 2021-10-03 ENCOUNTER — Other Ambulatory Visit: Payer: PPO

## 2021-10-03 DIAGNOSIS — R972 Elevated prostate specific antigen [PSA]: Secondary | ICD-10-CM

## 2021-10-04 LAB — PSA: Prostate Specific Ag, Serum: 4.8 ng/mL — ABNORMAL HIGH (ref 0.0–4.0)

## 2021-10-07 ENCOUNTER — Encounter
Admission: RE | Disposition: A | Discharge status: Home / Self Care | Payer: Self-pay | Source: Home / Self Care | Attending: Gastroenterology

## 2021-10-07 ENCOUNTER — Ambulatory Visit
Admission: RE | Admit: 2021-10-07 | Discharge: 2021-10-07 | Disposition: A | Discharge status: Home / Self Care | Payer: PPO | Attending: Gastroenterology | Admitting: Gastroenterology

## 2021-10-07 ENCOUNTER — Ambulatory Visit: Payer: PPO | Admitting: Anesthesiology

## 2021-10-07 ENCOUNTER — Encounter: Payer: Self-pay | Admitting: *Deleted

## 2021-10-07 DIAGNOSIS — K219 Gastro-esophageal reflux disease without esophagitis: Secondary | ICD-10-CM | POA: Diagnosis not present

## 2021-10-07 DIAGNOSIS — K22719 Barrett's esophagus with dysplasia, unspecified: Secondary | ICD-10-CM | POA: Diagnosis not present

## 2021-10-07 DIAGNOSIS — R1310 Dysphagia, unspecified: Secondary | ICD-10-CM | POA: Insufficient documentation

## 2021-10-07 DIAGNOSIS — Z8616 Personal history of COVID-19: Secondary | ICD-10-CM | POA: Diagnosis not present

## 2021-10-07 DIAGNOSIS — E785 Hyperlipidemia, unspecified: Secondary | ICD-10-CM | POA: Diagnosis not present

## 2021-10-07 DIAGNOSIS — K449 Diaphragmatic hernia without obstruction or gangrene: Secondary | ICD-10-CM | POA: Diagnosis not present

## 2021-10-07 DIAGNOSIS — K209 Esophagitis, unspecified without bleeding: Secondary | ICD-10-CM | POA: Diagnosis not present

## 2021-10-07 DIAGNOSIS — I10 Essential (primary) hypertension: Secondary | ICD-10-CM | POA: Insufficient documentation

## 2021-10-07 DIAGNOSIS — H547 Unspecified visual loss: Secondary | ICD-10-CM | POA: Diagnosis not present

## 2021-10-07 DIAGNOSIS — K2270 Barrett's esophagus without dysplasia: Secondary | ICD-10-CM | POA: Insufficient documentation

## 2021-10-07 DIAGNOSIS — G4730 Sleep apnea, unspecified: Secondary | ICD-10-CM | POA: Diagnosis not present

## 2021-10-07 DIAGNOSIS — G4733 Obstructive sleep apnea (adult) (pediatric): Secondary | ICD-10-CM | POA: Diagnosis not present

## 2021-10-07 HISTORY — PX: ESOPHAGOGASTRODUODENOSCOPY (EGD) WITH PROPOFOL: SHX5813

## 2021-10-07 SURGERY — ESOPHAGOGASTRODUODENOSCOPY (EGD) WITH PROPOFOL
Anesthesia: General

## 2021-10-07 MED ORDER — SODIUM CHLORIDE 0.9 % IV SOLN
INTRAVENOUS | Status: DC
  Administered 2021-10-07: 1000 mL via INTRAVENOUS

## 2021-10-07 MED ORDER — PROPOFOL 10 MG/ML IV BOLUS
INTRAVENOUS | Status: AC
  Filled 2021-10-07: qty 20

## 2021-10-07 MED ORDER — PROPOFOL 10 MG/ML IV BOLUS
INTRAVENOUS | Status: DC | PRN
  Administered 2021-10-07: 165 ug/kg/min via INTRAVENOUS
  Administered 2021-10-07: 70 mg via INTRAVENOUS

## 2021-10-07 NOTE — Op Note (Signed)
[facility]astro[facility] Patient Name: [name] [name] Procedure Date: 10/07/2021 12:38 PM MRN: [id number] Account #: [id number] Date of Birth: 08-27-1950 Admit Type: Outpatient Age: 71 Room: [facility] ENDO ROOM 3 Gender: Male Note Status: Finalized Instrument Name: Upper Endoscope [id number] Procedure:             Upper GI endoscopy Indications:           Barrett's esophagus Providers:             Andrey Farmer MD, MD Medicines:             Monitored Anesthesia Care Complications:         No immediate complications. Estimated blood loss:                         Minimal. Procedure:             Pre-Anesthesia Assessment:                        - Prior to the procedure, a History and Physical was                         performed, and patient medications and allergies were                         reviewed. The patient is competent. The risks and                         benefits of the procedure and the sedation options and                         risks were discussed with the patient. All questions                         were answered and informed consent was obtained.                         Patient identification and proposed procedure were                         verified by the physician, the nurse, the                         anesthesiologist, the anesthetist and the technician                         in the endoscopy suite. Mental Status Examination:                         alert and oriented. Airway Examination: normal                         oropharyngeal airway and neck mobility. Respiratory                         Examination: clear to auscultation. CV Examination:                         normal. Prophylactic Antibiotics: The patient does not  require prophylactic antibiotics. Prior                         Anticoagulants: The patient has taken no previous                         anticoagulant or antiplatelet agents. ASA Grade                          Assessment: II - A patient with mild systemic disease.                         After reviewing the risks and benefits, the patient                         was deemed in satisfactory condition to undergo the                         procedure. The anesthesia plan was to use monitored                         anesthesia care (MAC). Immediately prior to                         administration of medications, the patient was                         re-assessed for adequacy to receive sedatives. The                         heart rate, respiratory rate, oxygen saturations,                         blood pressure, adequacy of pulmonary ventilation, and                         response to care were monitored throughout the                         procedure. The physical status of the patient was                         re-assessed after the procedure.                        After obtaining informed consent, the endoscope was                         passed under direct vision. Throughout the procedure,                         the patient's blood pressure, pulse, and oxygen                         saturations were monitored continuously. The Endoscope                         was introduced through the mouth, and advanced to the  second part of duodenum. The upper GI endoscopy was                         accomplished without difficulty. The patient tolerated                         the procedure well. Findings:      There were esophageal mucosal changes classified as Barrett's stage       C5-M5 per Prague criteria present in the lower third of the esophagus.       The maximum longitudinal extent of these mucosal changes was 5 cm in       length. Mucosa was biopsied with a cold forceps for histology in 4       quadrants at intervals of 1 cm in the lower third of the esophagus. A       total of 6 specimen bottles were sent to pathology. Estimated blood loss       was  minimal.      A small hiatal hernia was present.      The entire examined stomach was normal.      The examined duodenum was normal. Impression:            - Esophageal mucosal changes classified as Barrett's                         stage C5-M5 per Prague criteria. Biopsied.                        - Small hiatal hernia.                        - Normal stomach.                        - Normal examined duodenum. Recommendation:        - Discharge patient to home.                        - Resume previous diet.                        - Continue present medications.                        - Await pathology results.                        - Repeat upper endoscopy for surveillance based on                         pathology results.                        - Return to referring physician as previously                         scheduled. Procedure Code(s):     --- Professional ---                        [phone], Esophagogastroduodenoscopy, flexible,  transoral; with biopsy, single or multiple Diagnosis Code(s):     --- Professional ---                        K22.70, Barrett's esophagus without dysplasia                        K44.9, Diaphragmatic hernia without obstruction or                         gangrene CPT copyright 2019 American Medical Association. All rights reserved. The codes documented in this report are preliminary and upon coder review may  be revised to meet current compliance requirements. Andrey Farmer MD, MD 10/07/2021 12:59:18 PM Number of Addenda: 0 Note Initiated On: 10/07/2021 12:38 PM Estimated Blood Loss:  Estimated blood loss was minimal.      [facility]

## 2021-10-07 NOTE — Anesthesia Preprocedure Evaluation (Signed)
Anesthesia Evaluation  Patient identified by MRN, date of birth, ID band Patient awake    Reviewed: Allergy & Precautions, NPO status , Patient's Chart, lab work & pertinent test results  History of Anesthesia Complications Negative for: history of anesthetic complications  Airway Mallampati: III  TM Distance: >3 FB Neck ROM: full    Dental  (+) Chipped   Pulmonary sleep apnea ,    Pulmonary exam normal        Cardiovascular Exercise Tolerance: Good hypertension, (-) angina(-) Past MI Normal cardiovascular exam     Neuro/Psych PSYCHIATRIC DISORDERS negative neurological ROS     GI/Hepatic Neg liver ROS, GERD  Controlled,  Endo/Other  negative endocrine ROS  Renal/GU negative Renal ROS  negative genitourinary   Musculoskeletal   Abdominal   Peds  Hematology negative hematology ROS (+)   Anesthesia Other Findings Past Medical History: No date: 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 and some scarring               in left eye but not blind  No date: COVID-19     Comment:  03/2020 No date: Depression     Comment:  remote h/o suicide ideation  No date: GERD (gastroesophageal reflux disease) No date: Hypertension No date: Sleep apnea  Past Surgical History: 04/01/2018: COLONOSCOPY WITH PROPOFOL; N/A     Comment:  Procedure: COLONOSCOPY WITH PROPOFOL;  Surgeon:               Lollie Sails, MD;  Location: [facility] ENDOSCOPY;                Service: Endoscopy;  Laterality: N/A; 07/02/2021: COLONOSCOPY WITH PROPOFOL; N/A     Comment:  Procedure: COLONOSCOPY WITH PROPOFOL;  Surgeon:               [name], MD;  Location: [facility] ENDOSCOPY;                Service: Endoscopy;  Laterality: N/A; 04/01/2018: ESOPHAGOGASTRODUODENOSCOPY; N/A     Comment:  Procedure: ESOPHAGOGASTRODUODENOSCOPY (EGD);  Surgeon:               Lollie Sails, MD;  Location: [facility] ENDOSCOPY;                Service:  Endoscopy;  Laterality: N/A; 07/02/2021: ESOPHAGOGASTRODUODENOSCOPY (EGD) WITH PROPOFOL; N/A     Comment:  Procedure: ESOPHAGOGASTRODUODENOSCOPY (EGD) WITH               PROPOFOL;  Surgeon: [name], MD;  Location:               [facility] ENDOSCOPY;  Service: Endoscopy;  Laterality: N/A; 1980: HEMORROIDECTOMY No date: VASECTOMY  BMI    Body Mass Index: 24.46 kg/m      Reproductive/Obstetrics negative OB ROS                             Anesthesia Physical Anesthesia Plan  ASA: 3  Anesthesia Plan: General   Post-op Pain Management:    Induction: Intravenous  PONV Risk Score and Plan: Propofol infusion and TIVA  Airway Management Planned: Natural Airway and Nasal Cannula  Additional Equipment:   Intra-op Plan:   Post-operative Plan:   Informed Consent: I have reviewed the patients History and Physical, chart, labs and discussed the procedure including the risks, benefits and alternatives for the proposed anesthesia with the  patient or authorized representative who has indicated his/her understanding and acceptance.     Dental Advisory Given  Plan Discussed with: Anesthesiologist, CRNA and Surgeon  Anesthesia Plan Comments: (Patient consented for risks of anesthesia including but not limited to:  - adverse reactions to medications - risk of airway placement if required - damage to eyes, teeth, lips or other oral mucosa - nerve damage due to positioning  - sore throat or hoarseness - Damage to heart, brain, nerves, lungs, other parts of body or loss of life  Patient voiced understanding.)        Anesthesia Quick Evaluation

## 2021-10-07 NOTE — H&P (Signed)
Outpatient short stay form Pre-procedure 10/07/2021  [name] Rubenstein, MD  Primary Physician: Pcp, No  Reason for visit:  Barrett's esophagus with indefinite dysplasia  History of present illness:    71 y/o gentleman with history of BE with indefinite dysplasia here for repeat EGD. No blood thinners. No family history of GI malignancies. No significant abdominal surgeries.   Current Facility-Administered Medications:    0.9 %  sodium chloride infusion, , Intravenous, Continuous, Amere Iott, Hilton Cork, MD  Medications Prior to Admission  Medication Sig Dispense Refill Last Dose   amLODipine (NORVASC) 10 MG tablet Take 1 tablet (10 mg total) by mouth daily. 90 tablet 3 10/07/2021 at 0830   atorvastatin (LIPITOR) 20 MG tablet Take 20 mg by mouth daily.   10/06/2021   cholecalciferol (VITAMIN D3) 10 MCG (400 UNIT) TABS tablet Take 400 Units by mouth daily.   10/06/2021   meclizine (ANTIVERT) 25 MG tablet Take 25 mg by mouth 3 (three) times daily as needed for dizziness.    at prn   pravastatin (PRAVACHOL) 20 MG tablet Take 1 tablet (20 mg total) by mouth daily. After 6 pm 90 tablet 3 10/06/2021   RABEprazole (ACIPHEX) 20 MG tablet Take 1 tablet (20 mg total) by mouth daily. 30 min before breakfast or dinner 90 tablet 3 10/07/2021   tamsulosin (FLOMAX) 0.4 MG CAPS capsule TAKE 1 CAPSULE BY MOUTH ONCE DAILY 90 capsule 3 10/06/2021   traZODone (DESYREL) 100 MG tablet Take 100 mg by mouth at bedtime. (Patient not taking: Reported on 10/07/2021)   Not Taking     Allergies  Allergen Reactions   Omeprazole Diarrhea   Statins Other (See Comments)    Fatigue,muscle aches      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 and some scarring in left eye but not blind    COVID-19    03/2020   Depression    remote h/o suicide ideation    GERD (gastroesophageal reflux disease)    Hypertension    Sleep apnea     Review of systems:  Otherwise negative.     Physical Exam  Gen: Alert, oriented. Appears stated age.  HEENT: PERRLA. Lungs: No respiratory distress CV: RRR Abd: soft, benign, no masses Ext: No edema    Planned procedures: Proceed with EGD. The patient understands the nature of the planned procedure, indications, risks, alternatives and potential complications including but not limited to bleeding, infection, perforation, damage to internal organs and possible oversedation/side effects from anesthesia. The patient agrees and gives consent to proceed.  Please refer to procedure notes for findings, recommendations and patient disposition/instructions.     [name] Rubenstein, MD [facility]

## 2021-10-07 NOTE — Anesthesia Postprocedure Evaluation (Signed)
Anesthesia Post Note  Patient: [name] [name]  Procedure(s) Performed: ESOPHAGOGASTRODUODENOSCOPY (EGD) WITH PROPOFOL  Patient location during evaluation: Endoscopy Anesthesia Type: General Level of consciousness: awake and alert Pain management: pain level controlled Vital Signs Assessment: post-procedure vital signs reviewed and stable Respiratory status: spontaneous breathing, nonlabored ventilation, respiratory function stable and patient connected to nasal cannula oxygen Cardiovascular status: blood pressure returned to baseline and stable Postop Assessment: no apparent nausea or vomiting Anesthetic complications: no   No notable events documented.   Last Vitals:  Vitals:   10/07/21 1310 10/07/21 1319  BP: 96/65 111/87  Pulse: (!) 55 (!) 46  Resp: 18 14  Temp:    SpO2: 98% 100%    Last Pain:  Vitals:   10/07/21 1319  TempSrc:   PainSc: 0-No pain                 Precious Haws Klaus Casteneda

## 2021-10-07 NOTE — Interval H&P Note (Signed)
History and Physical Interval Note:  10/07/2021 12:36 PM  [name] [name]  has presented today for surgery, with the diagnosis of barretts.  The various methods of treatment have been discussed with the patient and family. After consideration of risks, benefits and other options for treatment, the patient has consented to  Procedure(s): ESOPHAGOGASTRODUODENOSCOPY (EGD) WITH PROPOFOL (N/A) as a surgical intervention.  The patient's history has been reviewed, patient examined, no change in status, stable for surgery.  I have reviewed the patient's chart and labs.  Questions were answered to the patient's satisfaction.     [name]  Ok to proceed with EGD

## 2021-10-07 NOTE — Transfer of Care (Signed)
Immediate Anesthesia Transfer of Care Note  Patient: [name] [name]  Procedure(s) Performed: ESOPHAGOGASTRODUODENOSCOPY (EGD) WITH PROPOFOL  Patient Location: PACU  Anesthesia Type:General  Level of Consciousness: awake and alert   Airway & Oxygen Therapy: Patient Spontanous Breathing and Patient connected to nasal cannula oxygen  Post-op Assessment: Report given to RN and Post -op Vital signs reviewed and stable  Post vital signs: Reviewed and stable  Last Vitals:  Vitals Value Taken Time  BP    Temp    Pulse 52 10/07/21 1258  Resp 10 10/07/21 1258  SpO2 100 % 10/07/21 1258  Vitals shown include unvalidated device data.  Last Pain:  Vitals:   10/07/21 1220  TempSrc: Temporal  PainSc: 0-No pain         Complications: No notable events documented.

## 2021-10-08 ENCOUNTER — Encounter: Payer: Self-pay | Admitting: Gastroenterology

## 2021-10-08 LAB — SURGICAL PATHOLOGY

## 2021-10-09 ENCOUNTER — Ambulatory Visit: Payer: PPO | Admitting: Urology

## 2021-10-21 ENCOUNTER — Telehealth: Payer: Self-pay

## 2021-10-21 NOTE — Telephone Encounter (Signed)
Patient wife is calling to request a paper copy of all lab work completed for this year. Patient wife specified documents are needed today by this afternoon.  They will come by to pick up

## 2021-12-26 ENCOUNTER — Telehealth: Payer: Self-pay

## 2021-12-26 NOTE — Telephone Encounter (Signed)
Patient's wife, Makoto Sellitto, called to state Dr. Deborra Medina stated she is willing to take patient as a transfer of care.  We need to verify that this is correct and schedule the [facility] appointment.

## 2021-12-26 NOTE — Telephone Encounter (Signed)
Can you schedule pt for a new patient appt with Dr. Derrel Nip.

## 2021-12-26 NOTE — Telephone Encounter (Signed)
Lm to call office and schedule a transfer of care to Dr Derrel Nip. Dr Derrel Nip approved on 12/26/21.

## 2021-12-31 ENCOUNTER — Ambulatory Visit: Payer: PPO | Admitting: Internal Medicine

## 2022-03-20 ENCOUNTER — Ambulatory Visit: Payer: PPO | Admitting: Neurology

## 2022-04-10 ENCOUNTER — Encounter: Payer: Self-pay | Admitting: Neurology

## 2022-04-10 ENCOUNTER — Ambulatory Visit: Payer: PPO | Admitting: Neurology

## 2022-04-10 ENCOUNTER — Telehealth: Payer: Self-pay | Admitting: Neurology

## 2022-04-10 VITALS — BP 171/81 | HR 52 | Ht 72.0 in | Wt 173.0 lb

## 2022-04-10 DIAGNOSIS — G3184 Mild cognitive impairment, so stated: Secondary | ICD-10-CM | POA: Diagnosis not present

## 2022-04-10 DIAGNOSIS — G20C Parkinsonism, unspecified: Secondary | ICD-10-CM

## 2022-04-10 NOTE — Telephone Encounter (Signed)
[place] Adv NPR sent to [facility] department [phone]

## 2022-04-10 NOTE — Progress Notes (Signed)
[facility]  PATIENT: [name] [name] DOB: 12-07-1950  REQUESTING CLINICIAN: Earleen Newport, MD HISTORY FROM: Patient and spouse  REASON FOR VISIT: Memory decline    HISTORICAL  CHIEF COMPLAINT:  Chief Complaint  Patient presents with   New Patient (Initial Visit)    Rm 12 with wife, wife states she noticed about 2 years ago the patient having memory problems. Not specific to short term or long term, but has not gotten any better or worse in the two years.    HISTORY OF PRESENT ILLNESS:  This is a 72 year old gentleman past medical history hypertension, hyperlipidemia, insomnia on trazodone who is presenting with memory problem.  Patient reports memory problem has been going on for the past 2 years.  He does have issues with short-term memory, word finding difficulty and that really upset him.  Sometimes he is very frustrated.  He lives with his wife, he is able to perform activities of daily living.  Wife reports that he does not repeat himself and he does not need constant reminders.  He still drives, denies being lost in familiar places and denies any recent accident.  He still handles his bills.  He did does not report any trouble with family members names.   TBI: Yes, in her 62s.  Stroke:    no past history of stroke Seizures:   no past history of seizures Sleep:  Diagnosed with sleep apnea but could not tolerate CPAP.   Mood: patient has  anxiety and depression Family history of Dementia: Mother with dementia in her 42  Functional status: independent in all ADLs and IADLs Patient lives with Spouse . Cooking: No  Cleaning: yes Shopping: Yes, with a list  Bathing: yes, no help needed Toileting: yes, no help needed Driving: Yes, no accident, has been lost  Bills: Patient   Ever left the stove on by accident?: N/A Forget how to use items around the house?: Denies  Getting lost going to familiar places?: Denies  Forgetting loved ones names?:  Denies  Word finding difficulty? Yes  Sleep: Good with Trazodone    OTHER MEDICAL CONDITIONS: Hypertension, hyperlipidemia, Insomnia    REVIEW OF SYSTEMS: Full 14 system review of systems performed and negative with exception of: As noted in the HPI   ALLERGIES: Allergies  Allergen Reactions   Omeprazole Diarrhea   Statins Other (See Comments)    Fatigue,muscle aches     HOME MEDICATIONS: Outpatient Medications Prior to Visit  Medication Sig Dispense Refill   cholecalciferol (VITAMIN D3) 10 MCG (400 UNIT) TABS tablet Take 400 Units by mouth daily.     RABEprazole (ACIPHEX) 20 MG tablet Take 1 tablet (20 mg total) by mouth daily. 30 min before breakfast or dinner 90 tablet 3   tamsulosin (FLOMAX) 0.4 MG CAPS capsule TAKE 1 CAPSULE BY MOUTH ONCE DAILY 90 capsule 3   traZODone (DESYREL) 100 MG tablet Take 100 mg by mouth at bedtime.     amLODipine (NORVASC) 10 MG tablet Take 1 tablet (10 mg total) by mouth daily. (Patient not taking: Reported on 04/10/2022) 90 tablet 3   atorvastatin (LIPITOR) 20 MG tablet Take 20 mg by mouth daily. (Patient not taking: Reported on 04/10/2022)     meclizine (ANTIVERT) 25 MG tablet Take 25 mg by mouth 3 (three) times daily as needed for dizziness. (Patient not taking: Reported on 04/10/2022)     pravastatin (PRAVACHOL) 20 MG tablet Take 1 tablet (20 mg total) by mouth daily. After 6  pm (Patient not taking: Reported on 04/10/2022) 90 tablet 3   No facility-administered medications prior to visit.    PAST MEDICAL HISTORY: 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 and some scarring in left eye but not blind    COVID-19    03/2020   Depression    remote h/o suicide ideation    GERD (gastroesophageal reflux disease)    Hypertension    Sleep apnea     PAST SURGICAL HISTORY: Past Surgical History:  Procedure Laterality Date   COLONOSCOPY WITH PROPOFOL N/A 04/01/2018   Procedure: COLONOSCOPY WITH PROPOFOL;   Surgeon: Lollie Sails, MD;  Location: [facility] ENDOSCOPY;  Service: Endoscopy;  Laterality: N/A;   COLONOSCOPY WITH PROPOFOL N/A 07/02/2021   Procedure: COLONOSCOPY WITH PROPOFOL;  Surgeon: [name], MD;  Location: [facility] ENDOSCOPY;  Service: Endoscopy;  Laterality: N/A;   ESOPHAGOGASTRODUODENOSCOPY N/A 04/01/2018   Procedure: ESOPHAGOGASTRODUODENOSCOPY (EGD);  Surgeon: Lollie Sails, MD;  Location: [facility] ENDOSCOPY;  Service: Endoscopy;  Laterality: N/A;   ESOPHAGOGASTRODUODENOSCOPY (EGD) WITH PROPOFOL N/A 07/02/2021   Procedure: ESOPHAGOGASTRODUODENOSCOPY (EGD) WITH PROPOFOL;  Surgeon: [name], MD;  Location: [facility] ENDOSCOPY;  Service: Endoscopy;  Laterality: N/A;   ESOPHAGOGASTRODUODENOSCOPY (EGD) WITH PROPOFOL N/A 10/07/2021   Procedure: ESOPHAGOGASTRODUODENOSCOPY (EGD) WITH PROPOFOL;  Surgeon: [name], MD;  Location: [facility] ENDOSCOPY;  Service: Endoscopy;  Laterality: N/A;   HEMORROIDECTOMY  1980   VASECTOMY      FAMILY HISTORY: Family History  Problem Relation Age of Onset   Dementia Mother    Stroke Mother        58s   Cancer Father        colon cancer   Stroke Father    Heart attack Father        h/o cig smoker    Prostate cancer Father        ?   Heart disease Father    Other Father        smoker   Cancer Brother        colon cancer dx'ed age 6 as of 06/2021   Esophageal cancer Brother    Heart attack Paternal Grandmother     SOCIAL HISTORY: Social History   Socioeconomic History   Marital status: Married    Spouse name: Theatre manager   Number of children: 2   Years of education: 12   Highest education level: Associate degree: academic program  Occupational History   Occupation: retired  Tobacco Use   Smoking status: Never   Smokeless tobacco: Never  Vaping Use   Vaping Use: Never used  Substance and Sexual Activity   Alcohol use: Yes    Alcohol/week: 0.0 standard drinks of alcohol   Drug use: No   Sexual activity: Yes    Partners:  Female  Other Topics Concern   Not on file  Social History Narrative   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, radio    Never smoker    Wears eye glasses   Social Determinants of Health   Financial Resource Strain: Low Risk  (06/25/2021)   Overall Financial Resource Strain (CARDIA)    Difficulty of Paying Living Expenses: Not hard at all  Food Insecurity: No Food Insecurity (06/25/2021)   Hunger Vital Sign    Worried About Running Out of Food in the Last Year: Never true    Ran Out of  Food in the Last Year: Never true  Transportation Needs: No Transportation Needs (06/25/2021)   PRAPARE - Hydrologist (Medical): No    Lack of Transportation (Non-Medical): No  Physical Activity: Sufficiently Active (06/25/2021)   Exercise Vital Sign    Days of Exercise per Week: 5 days    Minutes of Exercise per Session: 60 min  Stress: No Stress Concern Present (06/25/2021)   Comfort    Feeling of Stress : Not at all  Social Connections: [place] (06/25/2021)   Social Connection and Isolation Panel [NHANES]    Frequency of Communication with Friends and Family: Three times a week    Frequency of Social Gatherings with Friends and Family: Three times a week    Attends Religious Services: More than 4 times per year    Active Member of Clubs or Organizations: Yes    Attends Archivist Meetings: More than 4 times per year    Marital Status: Married  Human resources officer Violence: Not At Risk (06/25/2021)   Humiliation, Afraid, Rape, and Kick questionnaire    Fear of Current or Ex-Partner: No    Emotionally Abused: No    Physically Abused: No    Sexually Abused: No    PHYSICAL EXAM  GENERAL EXAM/CONSTITUTIONAL: Vitals:  Vitals:   04/10/22 1320  BP: (!) 171/81  Pulse: (!) 52  Weight: 173 lb (78.5 kg)  Height: 6' (1.829 m)   Body mass index  is 23.46 kg/m. Wt Readings from Last 3 Encounters:  04/10/22 173 lb (78.5 kg)  10/07/21 175 lb 5.7 oz (79.5 kg)  07/16/21 168 lb 3.2 oz (76.3 kg)   Patient is in no distress; well developed, nourished and groomed; neck is supple. He does have masked facies    EYES: Visual fields full to confrontation, Extraocular movements intacts,   MUSCULOSKELETAL: Gait, strength, tone, movements noted in Neurologic exam below  NEUROLOGIC: MENTAL STATUS:      No data to display            04/10/2022    1:24 PM  Montreal Cognitive Assessment   Visuospatial/ Executive (0/5) 2  Naming (0/3) 2  Attention: Read list of digits (0/2) 1  Attention: Read list of letters (0/1) 0  Attention: Serial 7 subtraction starting at 100 (0/3) 1  Language: Repeat phrase (0/2) 2  Language : Fluency (0/1) 1  Abstraction (0/2) 2  Delayed Recall (0/5) 0  Orientation (0/6) 4  Total 15     CRANIAL NERVE:  2nd, 3rd, 4th, 6th- visual fields full to confrontation, extraocular muscles intact, no nystagmus 5th - facial sensation symmetric 7th - facial strength symmetric. Masked facies  8th - hearing intact 9th - palate elevates symmetrically, uvula midline 11th - shoulder shrug symmetric 12th - tongue protrusion midline  MOTOR:  normal bulk and tone, full strength in the BUE, BLE. No rigidity but there is very mild bradykinesia   SENSORY:  normal and symmetric to light touch  COORDINATION:  finger-nose-finger, fine finger movements normal. Dysdiadochokinesia present   GAIT/STATION:  Normal but he has decrease arm swing. 5 steps with the pull test.    DIAGNOSTIC DATA (LABS, IMAGING, TESTING) - I reviewed patient records, labs, notes, testing and imaging myself where available.  Lab Results  Component Value Date   WBC 5.5 07/16/2021   HGB 14.8 07/16/2021   HCT 42.5 07/16/2021   MCV 95.3 07/16/2021   PLT 245.0  07/16/2021      Component Value Date/Time   NA 139 07/31/2021 0858   NA 139  12/30/2018 0822   K 4.6 07/31/2021 0858   CL 103 07/31/2021 0858   CO2 29 07/31/2021 0858   GLUCOSE 91 07/31/2021 0858   BUN 20 07/31/2021 0858   BUN 18 12/30/2018 0822   CREATININE 1.00 07/31/2021 0858   CREATININE 0.94 08/24/2018 1017   CALCIUM 9.7 07/31/2021 0858   PROT 7.4 07/31/2021 0858   PROT 6.7 12/30/2018 0822   ALBUMIN 4.7 07/31/2021 0858   ALBUMIN 4.4 12/30/2018 0822   AST 16 07/31/2021 0858   ALT 12 07/31/2021 0858   ALKPHOS 78 07/31/2021 0858   BILITOT 0.7 07/31/2021 0858   BILITOT 0.9 12/30/2018 0822   GFRNONAA 68 12/30/2018 0822   GFRNONAA 84 08/24/2018 1017   GFRAA 78 12/30/2018 0822   GFRAA 97 08/24/2018 1017   Lab Results  Component Value Date   CHOL 218 (H) 07/31/2021   HDL 52.20 07/31/2021   LDLCALC 149 (H) 07/31/2021   TRIG 80.0 07/31/2021   CHOLHDL 4 07/31/2021   No results found for: "HGBA1C" Lab Results  Component Value Date   VITAMINB12 1,265 (H) 07/16/2021   Lab Results  Component Value Date   TSH 4.52 07/31/2021    MRI Brain 03/11/2019 No acute or reversible finding. Mild age related volume loss. Minimal small vessel change of the hemispheric white matter. Tiny old cortical infarctions at the vertex bilaterally.   ASSESSMENT AND PLAN  72 y.o. year old male with medical history including sleep apnea not on CPAP, hypertension hyperlipidemia, who is presenting with memory decline for the past 2 years described as being forgetful, word finding difficulty and frustration.  On exam today he scored a 15 out of 30 on the MoCA indicative of moderate impairment.  He is still independent in his ADLs.  Patient likely has mild cognitive impairment, unclear if this is true dementia yet.  I will obtain the ATN profile to look for Alzheimer biomarker and also refer him for formal neuropsychological testing to help in the diagnosis.   On exam today he was noted to have masked facies, glabellar reflex present, decreased arm swing and mild bradykinesia.  I  will also request a DaTscan.  I will contact the patient to go over the result otherwise I will see him in 1 year or sooner if worse.  Both patient and spouse are comfortable with plans.   1. Mild cognitive impairment   2. Parkinsonism, unspecified Parkinsonism type      Patient Instructions  Will obtain ATN profile, his TSH and B12 were normal. Referral for formal neuropsychological testing For his parkinsonism seen on exam, will obtain a DaTscan Follow-up in 1 year or sooner if worse    There are well-accepted and sensible ways to reduce risk for Alzheimers disease and other degenerative brain disorders .  Exercise Daily Walk A daily 20 minute walk should be part of your routine. Disease related apathy can be a significant roadblock to exercise and the only way to overcome this is to make it a daily routine and perhaps have a reward at the end (something your loved one loves to eat or drink perhaps) or a personal trainer coming to the home can also be very useful. Most importantly, the patient is much more likely to exercise if the caregiver / spouse does it with him/her. In general a structured, repetitive schedule is best.  General Health: Any diseases which  effect your body will effect your brain such as a pneumonia, urinary infection, blood clot, heart attack or stroke. Keep contact with your primary care doctor for regular follow ups.  Sleep. A good nights sleep is healthy for the brain. Seven hours is recommended. If you have insomnia or poor sleep habits we can give you some instructions. If you have sleep apnea wear your mask.  Diet: Eating a heart healthy diet is also a good idea; fish and poultry instead of red meat, nuts (mostly non-peanuts), vegetables, fruits, olive oil or canola oil (instead of butter), minimal salt (use other spices to flavor foods), whole grain rice, bread, cereal and pasta and wine in moderation.Research is now showing that the MIND diet, which is a  combination of The Mediterranean diet and the DASH diet, is beneficial for cognitive processing and longevity. Information about this diet can be found in The MIND Diet, a book by Doyne Keel, MS, RDN, and online at [employer]book[employer].si  Finances, Power of Attorney and Advance Directives: You should consider putting legal safeguards in place with regard to financial and medical decision making. While the spouse always has power of attorney for medical and financial issues in the absence of any form, you should consider what you want in case the spouse / caregiver is no longer around or capable of making decisions.  Orders Placed This Encounter  Procedures   NM BRAIN DATSCAN TUMOR LOC INFLAM SPECT 1 DAY   ATN PROFILE   Ambulatory referral to Neuropsychology    No orders of the defined types were placed in this encounter.   Return in about 1 year (around 04/11/2023).  I have spent a total of 65 minutes dedicated to this patient today, preparing to see patient, performing a medically appropriate examination and evaluation, ordering tests and/or medications and procedures, and counseling and educating the patient/family/caregiver; independently interpreting result and communicating results to the family/patient/caregiver; and documenting clinical information in the electronic medical record.   Alric Ran, MD 04/11/2022, 10:06 AM  [facility] [address] [place], [place] [address] [phone]

## 2022-04-11 NOTE — Patient Instructions (Signed)
Will obtain ATN profile, his TSH and B12 were normal. Referral for formal neuropsychological testing For his parkinsonism seen on exam, will obtain a DaTscan Follow-up in 1 year or sooner if worse    There are well-accepted and sensible ways to reduce risk for Alzheimers disease and other degenerative brain disorders .  Exercise Daily Walk A daily 20 minute walk should be part of your routine. Disease related apathy can be a significant roadblock to exercise and the only way to overcome this is to make it a daily routine and perhaps have a reward at the end (something your loved one loves to eat or drink perhaps) or a personal trainer coming to the home can also be very useful. Most importantly, the patient is much more likely to exercise if the caregiver / spouse does it with him/her. In general a structured, repetitive schedule is best.  General Health: Any diseases which effect your body will effect your brain such as a pneumonia, urinary infection, blood clot, heart attack or stroke. Keep contact with your primary care doctor for regular follow ups.  Sleep. A good nights sleep is healthy for the brain. Seven hours is recommended. If you have insomnia or poor sleep habits we can give you some instructions. If you have sleep apnea wear your mask.  Diet: Eating a heart healthy diet is also a good idea; fish and poultry instead of red meat, nuts (mostly non-peanuts), vegetables, fruits, olive oil or canola oil (instead of butter), minimal salt (use other spices to flavor foods), whole grain rice, bread, cereal and pasta and wine in moderation.Research is now showing that the MIND diet, which is a combination of The Mediterranean diet and the DASH diet, is beneficial for cognitive processing and longevity. Information about this diet can be found in The MIND Diet, a book by Doyne Keel, MS, RDN, and online at [employer]book[employer].si  Finances, Power of Attorney and Advance  Directives: You should consider putting legal safeguards in place with regard to financial and medical decision making. While the spouse always has power of attorney for medical and financial issues in the absence of any form, you should consider what you want in case the spouse / caregiver is no longer around or capable of making decisions.

## 2022-04-15 ENCOUNTER — Telehealth: Payer: Self-pay | Admitting: Neurology

## 2022-04-15 LAB — ATN PROFILE
A -- Beta-amyloid 42/40 Ratio: 0.09 — ABNORMAL LOW (ref >0.102)
Beta-amyloid 40: 238.44 pg/mL
Beta-amyloid 42: 21.53 pg/mL
N -- NfL, Plasma: 4.23 pg/mL (ref 0.00–7.64)
T -- p-tau181: 2.55 pg/mL — ABNORMAL HIGH (ref 0.00–0.97)

## 2022-04-15 NOTE — Telephone Encounter (Signed)
Psych referral sent to Dr. Alphonzo Severance, phone # [phone].

## 2022-04-28 ENCOUNTER — Ambulatory Visit (INDEPENDENT_AMBULATORY_CARE_PROVIDER_SITE_OTHER): Payer: PPO | Admitting: Internal Medicine

## 2022-04-28 ENCOUNTER — Encounter: Payer: Self-pay | Admitting: Internal Medicine

## 2022-04-28 VITALS — BP 120/76 | HR 53 | Temp 98.2°F | Ht 72.0 in | Wt 173.2 lb

## 2022-04-28 DIAGNOSIS — Z8673 Personal history of transient ischemic attack (TIA), and cerebral infarction without residual deficits: Secondary | ICD-10-CM

## 2022-04-28 DIAGNOSIS — G309 Alzheimer's disease, unspecified: Secondary | ICD-10-CM

## 2022-04-28 DIAGNOSIS — E538 Deficiency of other specified B group vitamins: Secondary | ICD-10-CM

## 2022-04-28 DIAGNOSIS — E559 Vitamin D deficiency, unspecified: Secondary | ICD-10-CM

## 2022-04-28 DIAGNOSIS — F0280 Dementia in other diseases classified elsewhere without behavioral disturbance: Secondary | ICD-10-CM | POA: Insufficient documentation

## 2022-04-28 DIAGNOSIS — I10 Essential (primary) hypertension: Secondary | ICD-10-CM

## 2022-04-28 DIAGNOSIS — D72829 Elevated white blood cell count, unspecified: Secondary | ICD-10-CM

## 2022-04-28 DIAGNOSIS — F02A0 Dementia in other diseases classified elsewhere, mild, without behavioral disturbance, psychotic disturbance, mood disturbance, and anxiety: Secondary | ICD-10-CM

## 2022-04-28 DIAGNOSIS — G4733 Obstructive sleep apnea (adult) (pediatric): Secondary | ICD-10-CM | POA: Diagnosis not present

## 2022-04-28 DIAGNOSIS — R972 Elevated prostate specific antigen [PSA]: Secondary | ICD-10-CM | POA: Diagnosis not present

## 2022-04-28 DIAGNOSIS — F5104 Psychophysiologic insomnia: Secondary | ICD-10-CM

## 2022-04-28 DIAGNOSIS — K2270 Barrett's esophagus without dysplasia: Secondary | ICD-10-CM

## 2022-04-28 DIAGNOSIS — F0283 Dementia in other diseases classified elsewhere, unspecified severity, with mood disturbance: Secondary | ICD-10-CM | POA: Insufficient documentation

## 2022-04-28 DIAGNOSIS — R6889 Other general symptoms and signs: Secondary | ICD-10-CM | POA: Diagnosis not present

## 2022-04-28 DIAGNOSIS — M791 Myalgia, unspecified site: Secondary | ICD-10-CM | POA: Diagnosis not present

## 2022-04-28 DIAGNOSIS — T466X5A Adverse effect of antihyperlipidemic and antiarteriosclerotic drugs, initial encounter: Secondary | ICD-10-CM

## 2022-04-28 DIAGNOSIS — G3184 Mild cognitive impairment, so stated: Secondary | ICD-10-CM | POA: Insufficient documentation

## 2022-04-28 DIAGNOSIS — E785 Hyperlipidemia, unspecified: Secondary | ICD-10-CM | POA: Diagnosis not present

## 2022-04-28 MED ORDER — TRAZODONE HCL 100 MG PO TABS
100.0000 mg | ORAL_TABLET | Freq: Every day | ORAL | 1 refills | Status: DC
Start: 2022-04-28 — End: 2022-07-28

## 2022-04-28 NOTE — Progress Notes (Unsigned)
Subjective:  Patient ID: [name] [name], male    DOB: 12-22-1950  Age: 72 y.o. MRN: [id number]  CC: The primary encounter diagnosis was OSA (obstructive sleep apnea). Diagnoses of Hypertension, unspecified type, Hyperlipidemia, unspecified hyperlipidemia type, and Elevated PSA were also pertinent to this visit.   HPI KWESI SANGHA presents for transfer of care from Dr Aundra Dubin Chief Complaint  Patient presents with   Transitions Of Care    1) Barrett's Esophagus: managed by Dr Gustavo Lah ; last EGD July 2023 at which time his dose of PPI was increased:   taking  2 rabeprozole every morning .   No symptoms.    Needs follow up EGD but has not scheduled it yet.   2)  OSA: diagnosed remotely, 7 yrs ago while working for the city of [employer] for workup of sleep disorder.  Has not used CPAP at all since diagnosis due to persistent discomfort during trial of mask.  Does not snore  per wife, unless lying on his back .   Sleeps well with trazodone . No daytime somnolence   3) White coat HTN:  no longer taking amlodipine  since dr Jimmye Norman recommended stopping it in July 2023.  Home readings have always been normal 118/70   most recently highest seen was 143/86 Oct 2023.    4)CVD: h/o CVA, seen on Dec 2020 MRI.     No longer Taking atorvastatin or pravastatin since meeting with Dr Jimmye Norman. I reviewed the MRI  report with him and wife today which suggests history of lbilateral cortical infarcts  age indeterminate.     5) Alzheimers  dementia . Seeing neurologist in [place] referred by Dr Jimmye Norman.  Dr.  April Manson.  Has ordered  a brain scan on Wednesday  to rule out parkinson's disease.  Symptoms include slowness of ambulatory gait .Marland Kitchen However, patient states he continues to run daily for exercise, but has stopped playing pickle ball since his fall which resulted in head trauma.    5) BPH, elevated PSA :  sees urologist Erlene Quan . No prostate biopsy since PSA is trending down.  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  Occupational exposure to untreated /treated water.,  polluted land.  No history of cough, chest pain .   Passive smoke exposure.     Outpatient Medications Prior to Visit  Medication Sig Dispense Refill   CITRUS BERGAMOT PO Take 1 tablet by mouth daily.     co-enzyme Q-10 30 MG capsule Take 30 mg by mouth 3 (three) times daily.     Docosahexaenoic Acid (DHA) 200 MG CAPS Take 1 tablet by mouth daily.     LUTEIN PO Take 1 tablet by mouth daily.     medium chain triglycerides (MCT OIL) oil Take 15 mLs by mouth 3 (three) times daily.     Omega-3 Fatty Acids (FISH OIL) 300 MG CAPS Take 1 capsule by mouth daily.     POTASSIUM BICARBONATE PO Take 1 tablet by mouth daily.     RABEprazole (ACIPHEX) 20 MG tablet Take 1 tablet (20 mg total) by mouth daily. 30 min before breakfast or dinner 90 tablet 3   tamsulosin (FLOMAX) 0.4 MG CAPS capsule TAKE 1 CAPSULE BY MOUTH  ONCE DAILY 90 capsule 3   Thiamine HCl (B-1) 100 MG TABS Take 1 tablet by mouth daily.     traZODone (DESYREL) 100 MG tablet Take 100 mg by mouth at bedtime.     Turmeric (QC TUMERIC COMPLEX PO) Take 1 capsule by mouth daily.     vitamin B-12 (CYANOCOBALAMIN) 100 MCG tablet Take 100 mcg by mouth daily.     vitamin E 45 MG (100 UNITS) capsule Take 100 Units by mouth daily.     ZINC PICOLINATE PO Take 1 capsule by mouth daily.     amLODipine (NORVASC) 10 MG tablet Take 1 tablet (10 mg total) by mouth daily. (Patient not taking: Reported on 04/10/2022) 90 tablet 3   atorvastatin (LIPITOR) 20 MG tablet Take 20 mg by mouth daily. (Patient not taking: Reported on 04/10/2022)     cholecalciferol (VITAMIN D3) 10 MCG (400 UNIT) TABS tablet Take 400 Units by mouth daily. (Patient not taking: Reported on 04/28/2022)     meclizine  (ANTIVERT) 25 MG tablet Take 25 mg by mouth 3 (three) times daily as needed for dizziness. (Patient not taking: Reported on 04/10/2022)     pravastatin (PRAVACHOL) 20 MG tablet Take 1 tablet (20 mg total) by mouth daily. After 6 pm (Patient not taking: Reported on 04/10/2022) 90 tablet 3   No facility-administered medications prior to visit.    Review of Systems;  Patient denies headache, fevers, malaise, unintentional weight loss, skin rash, eye pain, sinus congestion and sinus pain, sore throat, dysphagia,  hemoptysis , cough, dyspnea, wheezing, chest pain, palpitations, orthopnea, edema, abdominal pain, nausea, melena, diarrhea, constipation, flank pain, dysuria, hematuria, urinary  Frequency, nocturia, numbness, tingling, seizures,  Focal weakness, Loss of consciousness,  Tremor, insomnia, depression, anxiety, and suicidal ideation.      Objective:  BP 120/76   Pulse (!) 53   Temp 98.2 F (36.8 C)   Ht 6' (1.829 m)   Wt 173 lb 3.2 oz (78.6 kg)   SpO2 97%   BMI 23.49 kg/m   BP Readings from Last 3 Encounters:  04/28/22 120/76  04/10/22 (!) 171/81  10/07/21 111/87    Wt Readings from Last 3 Encounters:  04/28/22 173 lb 3.2 oz (78.6 kg)  04/10/22 173 lb (78.5 kg)  10/07/21 175 lb 5.7 oz (79.5 kg)    Physical Exam Vitals reviewed.  Constitutional:      General: He is not in acute distress.    Appearance: Normal appearance. He is normal weight. He is not ill-appearing, toxic-appearing or diaphoretic.  HENT:     Head: Normocephalic and atraumatic.     Right Ear: Tympanic membrane, ear canal and external ear normal. There is no impacted cerumen.     Left Ear: Tympanic membrane, ear canal and external ear normal. There is no impacted cerumen.     Nose: Nose normal.     Mouth/Throat:     Mouth: Mucous membranes are moist.     Pharynx: Oropharynx is clear.  Eyes:     General: No scleral icterus.       Right eye: No discharge.        Left eye: No discharge.      Conjunctiva/sclera: Conjunctivae normal.  Neck:     Thyroid: No thyromegaly.     Vascular: No carotid bruit or JVD.  Cardiovascular:     Rate and Rhythm: Normal rate and regular rhythm.     Heart sounds: Normal heart sounds.  Pulmonary:     Effort:  Pulmonary effort is normal. No respiratory distress.     Breath sounds: Normal breath sounds.  Abdominal:     General: Bowel sounds are normal.     Palpations: Abdomen is soft. There is no mass.     Tenderness: There is no abdominal tenderness. There is no guarding or rebound.  Musculoskeletal:        General: Normal range of motion.     Cervical back: Normal range of motion and neck supple.  Lymphadenopathy:     Cervical: No cervical adenopathy.  Skin:    General: Skin is warm and dry.  Neurological:     General: No focal deficit present.     Mental Status: He is alert and oriented to person, place, and time. Mental status is at baseline.  Psychiatric:        Mood and Affect: Mood normal.        Behavior: Behavior normal.        Thought Content: Thought content normal.        Judgment: Judgment normal.     No results found for: "HGBA1C"  Lab Results  Component Value Date   CREATININE 1.00 07/31/2021   CREATININE 0.91 07/16/2021   CREATININE 1.03 02/20/2021    Lab Results  Component Value Date   WBC 5.5 07/16/2021   HGB 14.8 07/16/2021   HCT 42.5 07/16/2021   PLT 245.0 07/16/2021   GLUCOSE 91 07/31/2021   CHOL 218 (H) 07/31/2021   TRIG 80.0 07/31/2021   HDL 52.20 07/31/2021   LDLCALC 149 (H) 07/31/2021   ALT 12 07/31/2021   AST 16 07/31/2021   NA 139 07/31/2021   K 4.6 07/31/2021   CL 103 07/31/2021   CREATININE 1.00 07/31/2021   BUN 20 07/31/2021   CO2 29 07/31/2021   TSH 4.52 07/31/2021   PSA 4.2 06/16/2017    No results found.  Assessment & Plan:  .OSA (obstructive sleep apnea)  Hypertension, unspecified type -     Comprehensive metabolic panel  Hyperlipidemia, unspecified hyperlipidemia type -      Lipid panel -     Comprehensive metabolic panel  Elevated PSA -     CBC with Differential/Platelet     I provided 30 minutes of face-to-face time during this encounter reviewing patient's last visit with me, patient's  most recent visit with cardiology,  nephrology,  and neurology,  recent surgical and non surgical procedures, previous  labs and imaging studies, counseling on currently addressed issues,  and post visit ordering to diagnostics and therapeutics .   Follow-up: No follow-ups on file.   Crecencio Mc, MD

## 2022-04-28 NOTE — Assessment & Plan Note (Signed)
Untreated ,  statins were stopped in July 2023 by Jimmye Norman.

## 2022-04-28 NOTE — Patient Instructions (Addendum)
Good to meet you!  I am checking your cholesterol,  thyroid etc today

## 2022-04-28 NOTE — Assessment & Plan Note (Signed)
Unknown to patient ,,  was seen on Dec 2020 MRI Brain  old cortical  infarctions bilaterally .  MRi was done because of a history of  fall with blunt head trauma and scalp laceration during a pickle ball game.  While out of town

## 2022-04-29 DIAGNOSIS — M791 Myalgia, unspecified site: Secondary | ICD-10-CM | POA: Insufficient documentation

## 2022-04-29 LAB — CBC WITH DIFFERENTIAL/PLATELET
Basophils Absolute: 0 10*3/uL (ref 0.0–0.1)
Basophils Relative: 0.4 % (ref 0.0–3.0)
Eosinophils Absolute: 0 10*3/uL (ref 0.0–0.7)
Eosinophils Relative: 0.4 % (ref 0.0–5.0)
HCT: 44.7 % (ref 39.0–52.0)
Hemoglobin: 15.4 g/dL (ref 13.0–17.0)
Lymphocytes Relative: 13.4 % (ref 12.0–46.0)
Lymphs Abs: 1.5 10*3/uL (ref 0.7–4.0)
MCHC: 34.4 g/dL (ref 30.0–36.0)
MCV: 94.8 fl (ref 78.0–100.0)
Monocytes Absolute: 0.6 10*3/uL (ref 0.1–1.0)
Monocytes Relative: 5.4 % (ref 3.0–12.0)
Neutro Abs: 8.9 10*3/uL — ABNORMAL HIGH (ref 1.4–7.7)
Neutrophils Relative %: 80.4 % — ABNORMAL HIGH (ref 43.0–77.0)
Platelets: 235 10*3/uL (ref 150.0–400.0)
RBC: 4.72 Mil/uL (ref 4.22–5.81)
RDW: 13 % (ref 11.5–15.5)
WBC: 11.1 10*3/uL — ABNORMAL HIGH (ref 4.0–10.5)

## 2022-04-29 LAB — COMPREHENSIVE METABOLIC PANEL
ALT: 13 U/L (ref 0–53)
AST: 17 U/L (ref 0–37)
Albumin: 4.3 g/dL (ref 3.5–5.2)
Alkaline Phosphatase: 61 U/L (ref 39–117)
BUN: 17 mg/dL (ref 6–23)
CO2: 30 mEq/L (ref 19–32)
Calcium: 9.3 mg/dL (ref 8.4–10.5)
Chloride: 101 mEq/L (ref 96–112)
Creatinine, Ser: 0.95 mg/dL (ref 0.40–1.50)
GFR: 80.69 mL/min (ref >60.00)
Glucose, Bld: 82 mg/dL (ref 70–99)
Potassium: 4.6 mEq/L (ref 3.5–5.1)
Sodium: 138 mEq/L (ref 135–145)
Total Bilirubin: 0.8 mg/dL (ref 0.2–1.2)
Total Protein: 6.7 g/dL (ref 6.0–8.3)

## 2022-04-29 LAB — LIPID PANEL
Cholesterol: 210 mg/dL — ABNORMAL HIGH (ref 0–200)
HDL: 43.3 mg/dL (ref >39.00)
LDL Cholesterol: 139 mg/dL — ABNORMAL HIGH (ref 0–99)
NonHDL: 166.32
Total CHOL/HDL Ratio: 5
Triglycerides: 136 mg/dL (ref 0.0–149.0)
VLDL: 27.2 mg/dL (ref 0.0–40.0)

## 2022-04-29 LAB — B12 AND FOLATE PANEL
Folate: 6.3 ng/mL (ref >5.9)
Vitamin B-12: 1107 pg/mL — ABNORMAL HIGH (ref 211–911)

## 2022-04-29 LAB — TSH: TSH: 3.42 u[IU]/mL (ref 0.35–5.50)

## 2022-04-29 LAB — VITAMIN D 25 HYDROXY (VIT D DEFICIENCY, FRACTURES): VITD: 34.82 ng/mL (ref 30.00–100.00)

## 2022-04-29 NOTE — Assessment & Plan Note (Signed)
Persistent by prior ED July 2023 [facility])  continue double dose of Aciphex and follow up with GI for annual EGD.  He is asymptomatic

## 2022-04-29 NOTE — Assessment & Plan Note (Signed)
Managed with surveillance by Urology.  No prior biopsy

## 2022-04-29 NOTE — Assessment & Plan Note (Signed)
He reports imporved sleep quality with 100 mg trazodone dose

## 2022-04-29 NOTE — Assessment & Plan Note (Addendum)
Unclear how diagnosis was made,  given evidence of  bilateral cortical  infarcts and generalized atrophy on 2020  brain MRI.  He has excellent recall of events and medical history on today's exam.  Will request records from Neurology

## 2022-04-29 NOTE — Assessment & Plan Note (Signed)
Remote diagnosis .  He reports that dhe did not tolerate CPAP trial.  His wfe denies snoring and apneic events. No daytime hypersomnolance, or other symptoms of untreated OSA

## 2022-04-29 NOTE — Assessment & Plan Note (Addendum)
Previously treated with amlodipine which has been discontinued by Dr Jimmye Norman.  Home and current BP readings are normal without medication.

## 2022-04-30 ENCOUNTER — Encounter (HOSPITAL_COMMUNITY): Payer: PPO

## 2022-04-30 ENCOUNTER — Encounter (HOSPITAL_COMMUNITY): Admission: RE | Admit: 2022-04-30 | Payer: PPO | Source: Ambulatory Visit

## 2022-04-30 ENCOUNTER — Other Ambulatory Visit: Payer: PPO

## 2022-04-30 DIAGNOSIS — R972 Elevated prostate specific antigen [PSA]: Secondary | ICD-10-CM | POA: Diagnosis not present

## 2022-05-01 DIAGNOSIS — D72829 Elevated white blood cell count, unspecified: Secondary | ICD-10-CM | POA: Insufficient documentation

## 2022-05-01 LAB — PSA: Prostate Specific Ag, Serum: 6.7 ng/mL — ABNORMAL HIGH (ref 0.0–4.0)

## 2022-05-01 NOTE — Addendum Note (Signed)
Addended by: Crecencio Mc on: 05/01/2022 05:16 AM   Modules accepted: Orders

## 2022-05-01 NOTE — Assessment & Plan Note (Signed)
Repeat CBC one month

## 2022-05-16 ENCOUNTER — Encounter: Payer: Self-pay | Admitting: Urology

## 2022-05-16 ENCOUNTER — Ambulatory Visit (INDEPENDENT_AMBULATORY_CARE_PROVIDER_SITE_OTHER): Payer: PPO | Admitting: Urology

## 2022-05-16 VITALS — BP 203/100 | HR 60 | Ht 72.0 in | Wt 170.0 lb

## 2022-05-16 DIAGNOSIS — R972 Elevated prostate specific antigen [PSA]: Secondary | ICD-10-CM

## 2022-05-16 NOTE — Progress Notes (Unsigned)
05/16/2022 10:05 AM   [name] [name] Quarry 1951/03/08 [id number]  Referring provider: Crecencio Mc, MD [address] [place] [address] [place],  [place] [address]  Chief Complaint  Patient presents with   Follow-up   Elevated PSA    PSA results    HPI:    PMH: 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 and some scarring in left eye but not blind    COVID-19    03/2020   Depression    remote h/o suicide ideation    GERD (gastroesophageal reflux disease)    Hypertension    Sleep apnea     Surgical History: Past Surgical History:  Procedure Laterality Date   COLONOSCOPY WITH PROPOFOL N/A 04/01/2018   Procedure: COLONOSCOPY WITH PROPOFOL;  Surgeon: Lollie Sails, MD;  Location[facility] ENDOSCOPY;  Service: Endoscopy;  Laterality: N/A;   COLONOSCOPY WITH PROPOFOL N/A 07/02/2021   Procedure: COLONOSCOPY WITH PROPOFOL;  Surgeon: [name], MD;  Location: [facility] ENDOSCOPY;  Service: Endoscopy;  Laterality: N/A;   ESOPHAGOGASTRODUODENOSCOPY N/A 04/01/2018   Procedure: ESOPHAGOGASTRODUODENOSCOPY (EGD);  Surgeon: Lollie Sails, MD;  Location: [facility] ENDOSCOPY;  Service: Endoscopy;  Laterality: N/A;   ESOPHAGOGASTRODUODENOSCOPY (EGD) WITH PROPOFOL N/A 07/02/2021   Procedure: ESOPHAGOGASTRODUODENOSCOPY (EGD) WITH PROPOFOL;  Surgeon: [name], MD;  Location: [facility] ENDOSCOPY;  Service: Endoscopy;  Laterality: N/A;   ESOPHAGOGASTRODUODENOSCOPY (EGD) WITH PROPOFOL N/A 10/07/2021   Procedure: ESOPHAGOGASTRODUODENOSCOPY (EGD) WITH PROPOFOL;  Surgeon: [name], MD;  Location: [facility] ENDOSCOPY;  Service: Endoscopy;  Laterality: N/A;   HEMORROIDECTOMY  1980   VASECTOMY      Home Medications:  Allergies as of 05/16/2022       Reactions   Omeprazole Diarrhea   Statins Other (See Comments)   Fatigue,muscle aches        Medication List        Accurate as of May 16, 2022 10:05 AM. If you have any questions, ask your  nurse or doctor.          B-1 100 MG Tabs Take 1 tablet by mouth daily.   CITRUS BERGAMOT PO Take 1 tablet by mouth daily.   co-enzyme Q-10 30 MG capsule Take 30 mg by mouth 3 (three) times daily.   DHA 200 MG Caps Take 1 tablet by mouth daily.   Fish Oil 300 MG Caps Take 1 capsule by mouth daily.   LUTEIN PO Take 1 tablet by mouth daily.   medium chain triglycerides oil Commonly known as: MCT OIL Take 15 mLs by mouth 3 (three) times daily.   POTASSIUM BICARBONATE PO Take 1 tablet by mouth daily.   QC TUMERIC COMPLEX PO Take 1 capsule by mouth daily.   RABEprazole 20 MG tablet Commonly known as: ACIPHEX Take 1 tablet (20 mg total) by mouth daily. 30 min before breakfast or dinner   tamsulosin 0.4 MG Caps capsule Commonly known as: FLOMAX TAKE 1 CAPSULE BY MOUTH ONCE DAILY   traZODone 100 MG tablet Commonly known as: DESYREL Take 1 tablet (100 mg total) by mouth at bedtime.   vitamin B-12 100 MCG tablet Commonly known as: CYANOCOBALAMIN Take 100 mcg by mouth daily.   vitamin E 45 MG (100 UNITS) capsule Take 100 Units by mouth daily.   ZINC PICOLINATE PO Take 1 capsule by mouth daily.        Allergies:  Allergies  Allergen Reactions   Omeprazole Diarrhea   Statins  Other (See Comments)    Fatigue,muscle aches     Family History: Family History  Problem Relation Age of Onset   Dementia Mother    Stroke Mother        72s   Cancer Father        colon cancer   Stroke Father    Heart attack Father        h/o cig smoker    Prostate cancer Father        ?   Heart disease Father    Other Father        smoker   Cancer Brother        colon cancer dx'ed age 59 as of 06/2021   Esophageal cancer Brother    Heart attack Paternal Grandmother     Social History:  reports that he has never smoked. He has never been exposed to tobacco smoke. He has never used smokeless tobacco. He reports current alcohol use. He reports that he does not use  drugs.   Physical Exam: BP (!) 203/100   Pulse 60   Ht 6' (1.829 m)   Wt 170 lb (77.1 kg)   BMI 23.06 kg/m   Constitutional:  Alert and oriented, No acute distress. HEENT: [place] AT, moist mucus membranes.  Trachea midline, no masses. Cardiovascular: No clubbing, cyanosis, or edema. Respiratory: Normal respiratory effort, no increased work of breathing. GI: Abdomen is soft, nontender, nondistended, no abdominal masses GU: No CVA tenderness Skin: No rashes, bruises or suspicious lesions. Neurologic: Grossly intact, no focal deficits, moving all 4 extremities. Psychiatric: Normal mood and affect.  Laboratory Data: Lab Results  Component Value Date   WBC 11.1 (H) 04/28/2022   HGB 15.4 04/28/2022   HCT 44.7 04/28/2022   MCV 94.8 04/28/2022   PLT 235.0 04/28/2022    Lab Results  Component Value Date   CREATININE 0.95 04/28/2022    Lab Results  Component Value Date   PSA 4.2 06/16/2017    No results found for: "TESTOSTERONE"  No results found for: "HGBA1C"  Urinalysis    Component Value Date/Time   APPEARANCEUR Clear 07/17/2020 0840   GLUCOSEU Negative 07/17/2020 0840   BILIRUBINUR Negative 07/17/2020 0840   PROTEINUR Negative 07/17/2020 0840   NITRITE Negative 07/17/2020 0840   LEUKOCYTESUR Negative 07/17/2020 0840    Lab Results  Component Value Date   LABMICR Comment 07/17/2020   WBCUA 0-5 12/30/2018   LABEPIT 0-10 12/30/2018   MUCUS Present 12/30/2018   BACTERIA None seen 12/30/2018    Pertinent Imaging: *** No results found for this or any previous visit.  No results found for this or any previous visit.  No results found for this or any previous visit.  No results found for this or any previous visit.  No results found for this or any previous visit.  No valid procedures specified. No results found for this or any previous visit.  No results found for this or any previous visit.   Assessment & Plan:    1. Elevated PSA *** - PSA;  Future   No follow-ups on file.  Hollice Espy, MD  [facility] [address], [place], [place] [address] [phone]

## 2022-05-16 NOTE — Patient Instructions (Signed)
Prostate Biopsy Instructions  Stop all aspirin or blood thinners (aspirin, plavix, coumadin, warfarin, motrin, ibuprofen, advil, aleve, naproxen, naprosyn) for 7 days prior to the procedure.  If you have any questions about stopping these medications, please contact your primary care physician or cardiologist.  Having a light meal prior to the procedure is recommended.  If you are diabetic or have low blood sugar please bring a small snack or glucose tablet.  A Fleets enema is needed to be purchased over the counter at a local pharmacy and used 2 hours before you scheduled appointment.  This can be purchased over the counter at any pharmacy.  Antibiotics will be administered in the clinic at the time of the procedure unless otherwise specified.    Please bring someone with you to the procedure to drive you home.  A follow up appointment has been scheduled for you to receive the results of the biopsy.  If you have any questions or concerns, please feel free to call the office at [phone] or send a Mychart message.    Thank you, Staff at [facility]

## 2022-06-17 ENCOUNTER — Encounter (HOSPITAL_COMMUNITY)
Admission: RE | Admit: 2022-06-17 | Discharge: 2022-06-17 | Disposition: A | Discharge status: Home / Self Care | Payer: PPO | Source: Ambulatory Visit | Attending: Neurology | Admitting: Neurology

## 2022-06-17 DIAGNOSIS — R4189 Other symptoms and signs involving cognitive functions and awareness: Secondary | ICD-10-CM | POA: Diagnosis not present

## 2022-06-17 DIAGNOSIS — G20C Parkinsonism, unspecified: Secondary | ICD-10-CM

## 2022-06-17 MED ORDER — POTASSIUM IODIDE (ANTIDOTE) 130 MG PO TABS
ORAL_TABLET | ORAL | Status: AC
  Administered 2022-06-17: 130 mg via ORAL
  Filled 2022-06-17: qty 1

## 2022-06-17 MED ORDER — IOFLUPANE I 123 185 MBQ/2.5ML IV SOLN
4.5000 | Freq: Once | INTRAVENOUS | Status: AC | PRN
  Administered 2022-06-17: 4.5 via INTRAVENOUS

## 2022-06-17 MED ORDER — POTASSIUM IODIDE (ANTIDOTE) 130 MG PO TABS: 130.0000 mg | ORAL_TABLET | Freq: Once | ORAL | Status: DC

## 2022-06-18 ENCOUNTER — Other Ambulatory Visit: Payer: Self-pay | Admitting: Neurology

## 2022-06-18 MED ORDER — CARBIDOPA-LEVODOPA 25-100 MG PO TABS: 1.0000 | ORAL_TABLET | Freq: Three times a day (TID) | ORAL | 6 refills | Status: DC

## 2022-07-09 DIAGNOSIS — H31003 Unspecified chorioretinal scars, bilateral: Secondary | ICD-10-CM | POA: Diagnosis not present

## 2022-07-09 DIAGNOSIS — H2513 Age-related nuclear cataract, bilateral: Secondary | ICD-10-CM | POA: Diagnosis not present

## 2022-07-21 DIAGNOSIS — D225 Melanocytic nevi of trunk: Secondary | ICD-10-CM | POA: Diagnosis not present

## 2022-07-21 DIAGNOSIS — B360 Pityriasis versicolor: Secondary | ICD-10-CM | POA: Diagnosis not present

## 2022-07-21 DIAGNOSIS — D2272 Melanocytic nevi of left lower limb, including hip: Secondary | ICD-10-CM | POA: Diagnosis not present

## 2022-07-21 DIAGNOSIS — D2261 Melanocytic nevi of right upper limb, including shoulder: Secondary | ICD-10-CM | POA: Diagnosis not present

## 2022-07-21 DIAGNOSIS — L570 Actinic keratosis: Secondary | ICD-10-CM | POA: Diagnosis not present

## 2022-07-22 ENCOUNTER — Telehealth: Payer: Self-pay | Admitting: Internal Medicine

## 2022-07-22 NOTE — Telephone Encounter (Signed)
Copied from CRM [id number]. Topic: Medicare AWV >> Jul 22, 2022 11:43 AM Rushie Goltz wrote: Reason for CRM: Called patient to schedule Medicare Annual Wellness Visit (AWV). Left message for patient to call back and schedule Medicare Annual Wellness Visit (AWV).  Last date of AWV: 06/25/2021  Please schedule an AWVS appointment at any time with [facility] VISIT.  If any questions, please contact me at [phone].    Thank you,  [facility] Support [facility] Direct dial  [phone]

## 2022-07-28 ENCOUNTER — Other Ambulatory Visit: Payer: Self-pay | Admitting: Internal Medicine

## 2022-08-12 ENCOUNTER — Other Ambulatory Visit: Payer: PPO

## 2022-08-14 ENCOUNTER — Other Ambulatory Visit: Payer: Self-pay

## 2022-08-14 ENCOUNTER — Other Ambulatory Visit: Payer: PPO

## 2022-08-14 DIAGNOSIS — R972 Elevated prostate specific antigen [PSA]: Secondary | ICD-10-CM

## 2022-08-15 ENCOUNTER — Other Ambulatory Visit: Payer: Self-pay

## 2022-08-15 DIAGNOSIS — R972 Elevated prostate specific antigen [PSA]: Secondary | ICD-10-CM

## 2022-08-15 DIAGNOSIS — N401 Enlarged prostate with lower urinary tract symptoms: Secondary | ICD-10-CM

## 2022-08-15 LAB — PSA: Prostate Specific Ag, Serum: 4.8 ng/mL — ABNORMAL HIGH (ref 0.0–4.0)

## 2022-08-25 ENCOUNTER — Other Ambulatory Visit: Payer: Self-pay | Admitting: Urology

## 2022-10-22 ENCOUNTER — Telehealth: Payer: Self-pay | Admitting: Neurology

## 2022-10-22 NOTE — Telephone Encounter (Signed)
Wife has called and would like to do as Dr Teresa Coombs had suggested, and that is to begin pt on medication for his memory since she is seeing signs of his memory worsening.

## 2022-10-22 NOTE — Telephone Encounter (Signed)
Called pt wife,she stated that she is not referring to medication Sinemet. She is requesting a call back from nurse.

## 2022-10-22 NOTE — Telephone Encounter (Signed)
Called pt wife on DPR, she reports talking to Dr. Teresa Coombs about another medication for memory if it got worse. I do not see note of adding any other medication if patient worsens, I advised that Dr. Teresa Coombs is out of office and she wanted to wait until he returns and speak with him if possible. She states to let her know if another appointment is needed sooner than one year.

## 2022-10-28 ENCOUNTER — Encounter: Payer: PPO | Admitting: Internal Medicine

## 2022-10-28 NOTE — Telephone Encounter (Signed)
Please add patient to my cancellation list to be re-evaluated. Please inquired about the neuropsychology referral.

## 2022-10-28 NOTE — Telephone Encounter (Signed)
Called and spoke to pt and stated that he will be placed on cancellation list for Dr. Teresa Coombs. He stated he didn't know what I was speaking about when I asked had he heard from neuropsych.

## 2022-10-30 ENCOUNTER — Encounter: Payer: Self-pay | Admitting: Internal Medicine

## 2022-10-30 ENCOUNTER — Ambulatory Visit: Payer: PPO | Admitting: Internal Medicine

## 2022-10-30 VITALS — BP 130/74 | HR 65 | Temp 97.6°F | Ht 72.0 in | Wt 158.0 lb

## 2022-10-30 DIAGNOSIS — G3184 Mild cognitive impairment, so stated: Secondary | ICD-10-CM

## 2022-10-30 DIAGNOSIS — R972 Elevated prostate specific antigen [PSA]: Secondary | ICD-10-CM | POA: Diagnosis not present

## 2022-10-30 DIAGNOSIS — Z23 Encounter for immunization: Secondary | ICD-10-CM | POA: Diagnosis not present

## 2022-10-30 DIAGNOSIS — Z0000 Encounter for general adult medical examination without abnormal findings: Secondary | ICD-10-CM

## 2022-10-30 DIAGNOSIS — E785 Hyperlipidemia, unspecified: Secondary | ICD-10-CM | POA: Diagnosis not present

## 2022-10-30 DIAGNOSIS — G4733 Obstructive sleep apnea (adult) (pediatric): Secondary | ICD-10-CM

## 2022-10-30 DIAGNOSIS — K2270 Barrett's esophagus without dysplasia: Secondary | ICD-10-CM

## 2022-10-30 DIAGNOSIS — R5383 Other fatigue: Secondary | ICD-10-CM | POA: Diagnosis not present

## 2022-10-30 DIAGNOSIS — I10 Essential (primary) hypertension: Secondary | ICD-10-CM

## 2022-10-30 DIAGNOSIS — Z8673 Personal history of transient ischemic attack (TIA), and cerebral infarction without residual deficits: Secondary | ICD-10-CM | POA: Diagnosis not present

## 2022-10-30 LAB — CBC WITH DIFFERENTIAL/PLATELET
Basophils Absolute: 0 10*3/uL (ref 0.0–0.1)
Basophils Relative: 0.5 % (ref 0.0–3.0)
Eosinophils Absolute: 0.1 10*3/uL (ref 0.0–0.7)
Eosinophils Relative: 1.4 % (ref 0.0–5.0)
HCT: 45.1 % (ref 39.0–52.0)
Hemoglobin: 15.2 g/dL (ref 13.0–17.0)
Lymphocytes Relative: 23.8 % (ref 12.0–46.0)
Lymphs Abs: 1.1 10*3/uL (ref 0.7–4.0)
MCHC: 33.7 g/dL (ref 30.0–36.0)
MCV: 96.5 fl (ref 78.0–100.0)
Monocytes Absolute: 0.4 10*3/uL (ref 0.1–1.0)
Monocytes Relative: 7.9 % (ref 3.0–12.0)
Neutro Abs: 3 10*3/uL (ref 1.4–7.7)
Neutrophils Relative %: 66.4 % (ref 43.0–77.0)
Platelets: 213 10*3/uL (ref 150.0–400.0)
RBC: 4.68 Mil/uL (ref 4.22–5.81)
RDW: 12.8 % (ref 11.5–15.5)
WBC: 4.6 10*3/uL (ref 4.0–10.5)

## 2022-10-30 LAB — COMPREHENSIVE METABOLIC PANEL
ALT: 6 U/L (ref 0–53)
AST: 16 U/L (ref 0–37)
Albumin: 4.5 g/dL (ref 3.5–5.2)
Alkaline Phosphatase: 77 U/L (ref 39–117)
BUN: 26 mg/dL — ABNORMAL HIGH (ref 6–23)
CO2: 32 mEq/L (ref 19–32)
Calcium: 9.5 mg/dL (ref 8.4–10.5)
Chloride: 99 mEq/L (ref 96–112)
Creatinine, Ser: 1.08 mg/dL (ref 0.40–1.50)
GFR: 68.94 mL/min (ref >60.00)
Glucose, Bld: 92 mg/dL (ref 70–99)
Potassium: 4.2 mEq/L (ref 3.5–5.1)
Sodium: 139 mEq/L (ref 135–145)
Total Bilirubin: 0.5 mg/dL (ref 0.2–1.2)
Total Protein: 6.9 g/dL (ref 6.0–8.3)

## 2022-10-30 LAB — LIPID PANEL
Cholesterol: 194 mg/dL (ref 0–200)
HDL: 54.8 mg/dL (ref >39.00)
LDL Cholesterol: 123 mg/dL — ABNORMAL HIGH (ref 0–99)
NonHDL: 139.32
Total CHOL/HDL Ratio: 4
Triglycerides: 82 mg/dL (ref 0.0–149.0)
VLDL: 16.4 mg/dL (ref 0.0–40.0)

## 2022-10-30 LAB — TSH: TSH: 4.06 u[IU]/mL (ref 0.35–5.50)

## 2022-10-30 LAB — MICROALBUMIN / CREATININE URINE RATIO
Creatinine,U: 82.8 mg/dL
Microalb Creat Ratio: 0.8 mg/g (ref 0.0–30.0)
Microalb, Ur: <0.7 mg/dL (ref 0.0–1.9)

## 2022-10-30 LAB — HEMOGLOBIN A1C: Hgb A1c MFr Bld: 5.2 % (ref 4.6–6.5)

## 2022-10-30 LAB — LDL CHOLESTEROL, DIRECT: Direct LDL: 126 mg/dL

## 2022-10-30 MED ORDER — TRAZODONE HCL 100 MG PO TABS: 100.0000 mg | ORAL_TABLET | Freq: Every day | ORAL | 1 refills | Status: DC

## 2022-10-30 MED ORDER — ESCITALOPRAM OXALATE 5 MG PO TABS: 5.0000 mg | ORAL_TABLET | Freq: Every day | ORAL | 0 refills | Status: DC

## 2022-10-30 NOTE — Assessment & Plan Note (Signed)
Remote diagnosis .  Untreated .  He reports that  he did not tolerate CPAP trial.  His wfe denies snoring and apneic events. No daytime hypersomnolance, or other symptoms of untreated OSA

## 2022-10-30 NOTE — Assessment & Plan Note (Signed)
Unknown to patient ,,  was an incidental finding  on Dec 2020 MRI Brain done at outside facility  which noted   old cortical  infarctions bilaterally .  MRi was done because of a history of  fall with blunt head trauma and scalp laceration during a pickle ball game while out of town

## 2022-10-30 NOTE — Progress Notes (Signed)
Patient ID: [name] [name], male    DOB: [birth year]  Age: 72 y.o. MRN: [id number]  The patient is here for annual preventive examination and management of other chronic and acute problems.   The risk factors are reflected in the social history.   The roster of all physicians providing medical care to patient - is listed in the Snapshot section of the chart.   Activities of daily living:  The patient is 100% independent in all ADLs: dressing, toileting, feeding as well as independent mobility   Home safety : The patient has smoke detectors in the home. They wear seatbelts.  There are no unsecured firearms at home. There is no violence in the home.    There is no risks for hepatitis, STDs or HIV. There is no   history of blood transfusion. They have no travel history to infectious disease endemic areas of the world.   The patient has seen their dentist in the last six month. They have seen their eye doctor in the last year. The patinet  denies slight hearing difficulty with regard to whispered voices and some television programs.  They have deferred audiologic testing in the last year.  They do not  have excessive sun exposure. Discussed the need for sun protection: hats, long sleeves and use of sunscreen if there is significant sun exposure.    Diet: the importance of a healthy diet is discussed. They do have a healthy diet.   The benefits of regular aerobic exercise were discussed. The patient  exercises  3 to 5 days per week  for  60 minutes.    Depression screen: there are no signs or vegative symptoms of depression- irritability, change in appetite, anhedonia, sadness/tearfullness.   The following portions of the patient's history were reviewed and updated as appropriate: allergies, current medications, past family history, past medical history,  past surgical history, past social history  and problem list.   Visual acuity was not assessed per patient preference since the patient has regular  follow up with an  ophthalmologist. Hearing and body mass index were assessed and reviewed.    During the course of the visit the patient was educated and counseled about appropriate screening and preventive services including : fall prevention , diabetes screening, nutrition counseling, colorectal cancer screening, and rec"dmenommended immunizations.    Chief Complaint:   1) BPH: using a supplement instead of flomax due to cost .  No nocturia.  PSA coming down,  2) dementia:  (diagnosed by [facility]) "getting worse"  (per patient,  mother had "dementia" which came on suddenly ) .  He notes increased performance anxiety surrounding appointments due to feat of being late,  getting lost difficulty remembering the way to the office and increased anxiety   Review of Symptoms  Patient denies headache, fevers, malaise, unintentional weight loss, skin rash, eye pain, sinus congestion and sinus pain, sore throat, dysphagia,  hemoptysis , cough, dyspnea, wheezing, chest pain, palpitations, orthopnea, edema, abdominal pain, nausea, melena, diarrhea, constipation, flank pain, dysuria, hematuria, urinary  Frequency, nocturia, numbness, tingling, seizures,  Focal weakness, Loss of consciousness,  Tremor, insomnia, depression, anxiety, and suicidal ideation.    Physical Exam:  BP 130/74   Pulse 65   Temp 97.6 F (36.4 C) (Oral)   Ht 6' (1.829 m)   Wt 158 lb (71.7 kg)   SpO2 98%   BMI 21.43 kg/m    Physical Exam Vitals reviewed.  Constitutional:      General: He is  not in acute distress.    Appearance: Normal appearance. He is normal weight. He is not ill-appearing, toxic-appearing or diaphoretic.  HENT:     Head: Normocephalic and atraumatic.     Right Ear: Tympanic membrane, ear canal and external ear normal. There is no impacted cerumen.     Left Ear: Tympanic membrane, ear canal and external ear normal. There is no impacted cerumen.     Nose: Nose normal.     Mouth/Throat:     Mouth: Mucous  membranes are moist.     Pharynx: Oropharynx is clear.  Eyes:     General: No scleral icterus.       Right eye: No discharge.        Left eye: No discharge.     Conjunctiva/sclera: Conjunctivae normal.  Neck:     Thyroid: No thyromegaly.     Vascular: No carotid bruit or JVD.  Cardiovascular:     Rate and Rhythm: Normal rate and regular rhythm.     Heart sounds: Normal heart sounds.  Pulmonary:     Effort: Pulmonary effort is normal. No respiratory distress.     Breath sounds: Normal breath sounds.  Abdominal:     General: Bowel sounds are normal.     Palpations: Abdomen is soft. There is no mass.     Tenderness: There is no abdominal tenderness. There is no guarding or rebound.  Musculoskeletal:        General: Normal range of motion.     Cervical back: Normal range of motion and neck supple.  Lymphadenopathy:     Cervical: No cervical adenopathy.  Skin:    General: Skin is warm and dry.  Neurological:     General: No focal deficit present.     Mental Status: He is alert and oriented to person, place, and time. Mental status is at baseline.  Psychiatric:        Mood and Affect: Mood normal.        Behavior: Behavior normal.        Thought Content: Thought content normal.        Judgment: Judgment normal.    Assessment and Plan: Essential hypertension Assessment & Plan: Previously treated with amlodipine which has been discontinued by Dr Mayford Knife.  Home and current BP readings remain  normal without medication.    Orders: -     Comprehensive metabolic panel -     Microalbumin / creatinine urine ratio  Hyperlipidemia, unspecified hyperlipidemia type -     Lipid panel -     LDL cholesterol, direct -     Hemoglobin A1c  Fatigue, unspecified type -     CBC with Differential/Platelet -     TSH  Obstructive sleep apnea Assessment & Plan: Remote diagnosis .  Untreated .  He reports that  he did not tolerate CPAP trial.  His wfe denies snoring and apneic events. No  daytime hypersomnolance, or other symptoms of untreated OSA    History of stroke Assessment & Plan: Unknown to patient ,,  was an incidental finding  on Dec 2020 MRI Brain done at outside facility  which noted   old cortical  infarctions bilaterally .  MRi was done because of a history of  fall with blunt head trauma and scalp laceration during a pickle ball game while out of town     Mild cognitive impairment Assessment & Plan: Diagnosis per neurology.  Has been referred to neuropsyc by neurology,  likely mixed etiology  vs vascular given presence of bilateral cortical infarcts on 2020 MRI brain .  Cognitive dysfunction is aggravated by performance anxiety . Trial of lexapro  offered and accepted.    Need for pneumococcal 20-valent conjugate vaccination -     Pneumococcal conjugate vaccine 20-valent  Annual physical exam Assessment & Plan: age appropriate education and counseling updated, referrals for preventative services and immunizations addressed, dietary and smoking counseling addressed, most recent labs reviewed.  I have personally reviewed and have noted:   1) the patient's medical and social history 2) The pt's use of alcohol, tobacco, and illicit drugs 3) The patient's current medications and supplements 4) Functional ability including ADL's, fall risk, home safety risk, hearing and visual impairment 5) Diet and physical activities 6) Evidence for depression or mood disorder 7) The patient's height, weight, and BMI have been recorded in the chart    I have made referrals, and provided counseling and education based on review of the above    Barrett's esophagus without dysplasia Assessment & Plan: Persistent by prior ED July 2023 Marva Panda)  continue double dose of Aciphex and follow up with GI for annual EGD.  He is asymptomatic    Elevated PSA Assessment & Plan: Managed with surveillance by Urology.  No prior biopsy   Other orders -     traZODone HCl; Take 1  tablet (100 mg total) by mouth at bedtime.  Dispense: 90 tablet; Refill: 1 -     Escitalopram Oxalate; Take 1 tablet (5 mg total) by mouth daily.  Dispense: 90 tablet; Refill: 0    Return in about 3 months (around 01/30/2023) for anxiety management .  Sherlene Shams, MD

## 2022-10-30 NOTE — Assessment & Plan Note (Addendum)
Diagnosis per neurology.  Has been referred to neuropsyc by neurology,  likely mixed etiology vs vascular given presence of bilateral cortical infarcts on 2020 MRI brain .  Cognitive dysfunction is aggravated by performance anxiety . Trial of lexapro  offered and accepted.

## 2022-10-30 NOTE — Patient Instructions (Signed)
Please start the Lexapro (escitalopram) at 1tablet daily with breakfast or lunch  for the first 2 weeks  to avoid nausea.  You can increase to 2 tablets after 2 weeks  if you do not feel any improvement in your anxiety    You received the Prevnar 20 vaccine today for protection against Streptococcal pneumonia

## 2022-11-01 ENCOUNTER — Encounter: Payer: Self-pay | Admitting: Internal Medicine

## 2022-11-01 NOTE — Assessment & Plan Note (Signed)
Persistent by prior ED July 2023 [facility])  continue double dose of Aciphex and follow up with GI for annual EGD.  He is asymptomatic

## 2022-11-01 NOTE — Assessment & Plan Note (Signed)
Previously treated with amlodipine which has been discontinued by Dr Mayford Knife.  Home and current BP readings remain  normal without medication.

## 2022-11-01 NOTE — Assessment & Plan Note (Signed)
Managed with surveillance by Urology.  No prior biopsy

## 2022-11-01 NOTE — Assessment & Plan Note (Signed)

## 2022-11-12 NOTE — Telephone Encounter (Signed)
Returned call to pt wife & relayed information to wait until next visit they stated until 2025 is to long they would like to try to get him on medication sooner than that

## 2022-11-12 NOTE — Telephone Encounter (Signed)
Wife has called back to report that they were contacted by neuropsychology but they declined testing.  Wife fears that had pt taken test and failed it would cause him to decline, please call wife and leave vm if she does not answer, her # is the primary contact #

## 2022-11-12 NOTE — Telephone Encounter (Signed)
Patient should be on the cancellation list and a new appointment will be available before end of the year

## 2022-11-12 NOTE — Telephone Encounter (Signed)
They are interested in taking a medication but doesn't want to take the one you mentioned decreases heart rate

## 2022-11-12 NOTE — Telephone Encounter (Signed)
Returned call to pt wife and stated that ultimately its the patient decision and she voiced she is afraid that he will lose the thrill to thrive so testing has been declined

## 2022-11-12 NOTE — Telephone Encounter (Signed)
At our next visit we will discuss medication.

## 2022-11-13 NOTE — Telephone Encounter (Signed)
Phone room: add to cancellation list and notify that to pt  Thanks,  

## 2022-11-25 ENCOUNTER — Ambulatory Visit: Payer: PPO | Admitting: Urology

## 2022-11-25 VITALS — BP 172/81 | HR 77 | Ht 72.0 in | Wt 158.0 lb

## 2022-11-25 DIAGNOSIS — R351 Nocturia: Secondary | ICD-10-CM

## 2022-11-25 DIAGNOSIS — N401 Enlarged prostate with lower urinary tract symptoms: Secondary | ICD-10-CM | POA: Diagnosis not present

## 2022-11-25 DIAGNOSIS — R3912 Poor urinary stream: Secondary | ICD-10-CM | POA: Diagnosis not present

## 2022-11-25 LAB — URINALYSIS, COMPLETE
Bilirubin, UA: NEGATIVE
Glucose, UA: NEGATIVE
Ketones, UA: NEGATIVE
Leukocytes,UA: NEGATIVE
Nitrite, UA: NEGATIVE
Protein,UA: NEGATIVE
RBC, UA: NEGATIVE
Specific Gravity, UA: 1.02 (ref 1.005–1.030)
Urobilinogen, Ur: 0.2 mg/dL (ref 0.2–1.0)
pH, UA: 7 (ref 5.0–7.5)

## 2022-11-25 LAB — MICROSCOPIC EXAMINATION: Bacteria, UA: NONE SEEN

## 2022-11-25 LAB — BLADDER SCAN AMB NON-IMAGING: Scan Result: 67

## 2022-11-25 NOTE — Progress Notes (Signed)
I,Amy L Pierron,acting as a scribe for [name], MD.,have documented all relevant documentation on the behalf of [name], MD,as directed by  [name], MD while in the presence of [name], MD.  11/25/2022 10:28 AM   [name] [name] March 26, 1951 [id number]  Referring provider: Sherlene Shams, MD [address] [place],  [place] [address]  Chief Complaint  Patient presents with   weak stream    HPI: 72 year-old male with a personal history of elevated PSA presents today for a follow-up.  He requested a sooner appointment today due to a recent event with his urination.  He underwent a prostate MRI on 04/28/2021 which showed PI-RADS 3 lesion of the left posterolateral peripheral zone at the base and benign prostatic hypertrophy and mild prostatomegaly.   He is currently on Flomax and his UA today is negative.  He reports having hypertension today, which could be because he was rushing to the office to not be late.  He explains he had a day recently that he experienced multiple times of frequent urination and weak stream. He didn't feel he was completely emptying his bladder that day. However, this hasn't happened again since.  Symptoms are now back to baseline.  He mentions a doctor told him he has Parkinson's and prescribed him medication but he didn't follow through on taking it. He doesn't think he has that. He does have dementia.   In addition to taking Flomax in the evening, he takes a supplement in the mornings that help his urinary symptoms as well but forgets what it is called.   PSA Trend: 09/29/2017      3.8 09/20/2018    3.5 10/04/2019      5.0 07/17/2020    5.0 10/03/2020      5.1 04/11/2021    5.9 10/03/2021      4.8 04/30/2022    6.7 08/14/2022    4.8   Results for orders placed or performed in visit on 11/25/22  Microscopic Examination   Urine  Result Value Ref Range   WBC, UA 0-5 0 - 5 /hpf   RBC, Urine 0-2 0 - 2 /hpf   Epithelial Cells  (non renal) 0-10 0 - 10 /hpf   Bacteria, UA None seen None seen/Few  Urinalysis, Complete  Result Value Ref Range   Specific Gravity, UA 1.020 1.005 - 1.030   pH, UA 7.0 5.0 - 7.5   Color, UA Yellow Yellow   Appearance Ur Clear Clear   Leukocytes,UA Negative Negative   Protein,UA Negative Negative/Trace   Glucose, UA Negative Negative   Ketones, UA Negative Negative   RBC, UA Negative Negative   Bilirubin, UA Negative Negative   Urobilinogen, Ur 0.2 0.2 - 1.0 mg/dL   Nitrite, UA Negative Negative   Microscopic Examination See below:   Bladder Scan (Post Void Residual) in office  Result Value Ref Range   Scan Result 67 ml     PMH: 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 and some scarring in left eye but not blind    COVID-19    03/2020   Depression    remote h/o suicide ideation    GERD (gastroesophageal reflux disease)    Hypertension    Sleep apnea    Syncope 05/20/2017    Surgical History: Past Surgical History:  Procedure Laterality Date   COLONOSCOPY WITH PROPOFOL N/A 04/01/2018   Procedure: COLONOSCOPY WITH  PROPOFOL;  Surgeon: Christena Deem, MD;  Location: [facility] ENDOSCOPY;  Service: Endoscopy;  Laterality: N/A;   COLONOSCOPY WITH PROPOFOL N/A 07/02/2021   Procedure: COLONOSCOPY WITH PROPOFOL;  Surgeon: Regis Bill, MD;  Location: [facility] ENDOSCOPY;  Service: Endoscopy;  Laterality: N/A;   ESOPHAGOGASTRODUODENOSCOPY N/A 04/01/2018   Procedure: ESOPHAGOGASTRODUODENOSCOPY (EGD);  Surgeon: Christena Deem, MD;  Location: [facility] ENDOSCOPY;  Service: Endoscopy;  Laterality: N/A;   ESOPHAGOGASTRODUODENOSCOPY (EGD) WITH PROPOFOL N/A 07/02/2021   Procedure: ESOPHAGOGASTRODUODENOSCOPY (EGD) WITH PROPOFOL;  Surgeon: Regis Bill, MD;  Location: [facility] ENDOSCOPY;  Service: Endoscopy;  Laterality: N/A;   ESOPHAGOGASTRODUODENOSCOPY (EGD) WITH PROPOFOL N/A 10/07/2021   Procedure: ESOPHAGOGASTRODUODENOSCOPY (EGD) WITH PROPOFOL;   Surgeon: Regis Bill, MD;  Location: [facility] ENDOSCOPY;  Service: Endoscopy;  Laterality: N/A;   HEMORROIDECTOMY  1980   VASECTOMY      Home Medications:  Allergies as of 11/25/2022       Reactions   Omeprazole Diarrhea   Statins Other (See Comments)   Fatigue,muscle aches        Medication List        Accurate as of November 25, 2022 10:28 AM. If you have any questions, ask your nurse or doctor.          B-1 100 MG Tabs Take 1 tablet by mouth daily.   carbidopa-levodopa 25-100 MG tablet Commonly known as: SINEMET IR Take 1 tablet by mouth 3 (three) times daily.   CITRUS BERGAMOT PO Take 1 tablet by mouth daily.   co-enzyme Q-10 30 MG capsule Take 30 mg by mouth 3 (three) times daily.   DHA 200 MG Caps Take 1 tablet by mouth daily.   escitalopram 5 MG tablet Commonly known as: Lexapro Take 1 tablet (5 mg total) by mouth daily.   Fish Oil 300 MG Caps Take 1 capsule by mouth daily.   ketoconazole 2 % shampoo Commonly known as: NIZORAL Apply 1 Application topically 2 (two) times a week.   LUTEIN PO Take 1 tablet by mouth daily.   medium chain triglycerides oil Commonly known as: MCT OIL Take 15 mLs by mouth 3 (three) times daily.   POTASSIUM BICARBONATE PO Take 1 tablet by mouth daily.   PROSTATE HEALTH PO Take by mouth.   QC TUMERIC COMPLEX PO Take 1 capsule by mouth daily.   RABEprazole 20 MG tablet Commonly known as: ACIPHEX Take 1 tablet (20 mg total) by mouth daily. 30 min before breakfast or dinner   tamsulosin 0.4 MG Caps capsule Commonly known as: FLOMAX TAKE 1 CAPSULE BY MOUTH ONCE DAILY   traZODone 100 MG tablet Commonly known as: DESYREL Take 1 tablet (100 mg total) by mouth at bedtime.   vitamin B-12 100 MCG tablet Commonly known as: CYANOCOBALAMIN Take 100 mcg by mouth daily.   vitamin E 45 MG (100 UNITS) capsule Take 100 Units by mouth daily.   ZINC PICOLINATE PO Take 1 capsule by mouth daily.         Allergies:  Allergies  Allergen Reactions   Omeprazole Diarrhea   Statins Other (See Comments)    Fatigue,muscle aches     Family History: Family History  Problem Relation Age of Onset   Dementia Mother    Stroke Mother        69s   Cancer Father        colon cancer   Stroke Father    Heart attack Father        h/o cig smoker  Prostate cancer Father        ?   Heart disease Father    Other Father        smoker   Cancer Brother        colon cancer dx'ed age 33 as of 06/2021   Esophageal cancer Brother    Heart attack Paternal Grandmother     Social History:  reports that he has never smoked. He has never been exposed to tobacco smoke. He has never used smokeless tobacco. He reports current alcohol use. He reports that he does not use drugs.   Physical Exam: BP (!) 172/81   Pulse 77   Ht 6' (1.829 m)   Wt 158 lb (71.7 kg)   BMI 21.43 kg/m   Constitutional:  Alert and oriented, No acute distress. HEENT: [place] AT, moist mucus membranes.  Trachea midline, no masses. Neurologic: Grossly intact, no focal deficits, moving all 4 extremities. Psychiatric: Normal mood and affect.   Urinalysis    Component Value Date/Time   APPEARANCEUR Clear 11/25/2022 0913   GLUCOSEU Negative 11/25/2022 0913   BILIRUBINUR Negative 11/25/2022 0913   PROTEINUR Negative 11/25/2022 0913   NITRITE Negative 11/25/2022 0913   LEUKOCYTESUR Negative 11/25/2022 0913    Lab Results  Component Value Date   LABMICR See below: 11/25/2022   WBCUA 0-5 11/25/2022   LABEPIT 0-10 11/25/2022   MUCUS Present 12/30/2018   BACTERIA None seen 11/25/2022     Assessment & Plan:    Elevated PSA  - Trending downwards recently. Scheduled to check again as previously discussed/scheduled  2. Urinary frequency/ incomplete bladder emptying  - An isolated incident recently. Will continue on his regimen of Flomax and supplements. If he has another day like this again he will reach out on MyChart. If  symptoms worsen may consider cystoscopy. -Urinalysis today is negative, he is emptying well and no concern for ongoing retention or infection, reassured -Continue Flomax  Return in about 6 months (around 05/28/2023).  I have reviewed the above documentation for accuracy and completeness, and I agree with the above.   [name], MD    [facility] [address] [place], [place] [address] [phone]

## 2022-12-30 DIAGNOSIS — K22719 Barrett's esophagus with dysplasia, unspecified: Secondary | ICD-10-CM | POA: Diagnosis not present

## 2023-01-12 ENCOUNTER — Other Ambulatory Visit: Payer: Self-pay

## 2023-01-12 ENCOUNTER — Telehealth: Payer: Self-pay | Admitting: Neurology

## 2023-01-12 NOTE — Telephone Encounter (Signed)
Call to wife, advised that per medication list in Epic and Dr. Teresa Coombs note, rivastigmine has not been prescribed by Dr. Teresa Coombs. Wife appreciative of call

## 2023-01-12 NOTE — Telephone Encounter (Signed)
Wife is asking for a call to clarify if Dr Teresa Coombs had ever prescribed rivastigmine, she would like a call to discuss.

## 2023-01-13 ENCOUNTER — Ambulatory Visit: Payer: PPO

## 2023-01-13 DIAGNOSIS — K22710 Barrett's esophagus with low grade dysplasia: Secondary | ICD-10-CM | POA: Diagnosis not present

## 2023-01-13 DIAGNOSIS — K449 Diaphragmatic hernia without obstruction or gangrene: Secondary | ICD-10-CM | POA: Diagnosis not present

## 2023-01-13 DIAGNOSIS — K2270 Barrett's esophagus without dysplasia: Secondary | ICD-10-CM | POA: Diagnosis not present

## 2023-01-13 HISTORY — PX: ESOPHAGOGASTRODUODENOSCOPY: SHX1529

## 2023-01-15 ENCOUNTER — Other Ambulatory Visit: Payer: Self-pay | Admitting: Internal Medicine

## 2023-01-29 ENCOUNTER — Ambulatory Visit: Payer: PPO | Admitting: Internal Medicine

## 2023-01-30 ENCOUNTER — Ambulatory Visit: Payer: PPO | Admitting: Internal Medicine

## 2023-01-30 DIAGNOSIS — R911 Solitary pulmonary nodule: Secondary | ICD-10-CM

## 2023-01-30 HISTORY — DX: Solitary pulmonary nodule: R91.1

## 2023-02-05 ENCOUNTER — Other Ambulatory Visit: Payer: Self-pay | Admitting: Neurology

## 2023-02-06 ENCOUNTER — Ambulatory Visit: Payer: PPO | Admitting: Internal Medicine

## 2023-02-09 ENCOUNTER — Encounter: Payer: Self-pay | Admitting: Internal Medicine

## 2023-02-09 ENCOUNTER — Telehealth: Payer: Self-pay | Admitting: Neurology

## 2023-02-09 NOTE — Telephone Encounter (Signed)
Pt's wife has called to make the request that on the upcoming appointment there not be any kind of memory testing done.  She feels this will only worsen pt's state of mind at home when he process how he would likely fail memory test. Wife would like a call to discuss further.

## 2023-02-10 ENCOUNTER — Telehealth: Payer: Self-pay

## 2023-02-10 ENCOUNTER — Ambulatory Visit: Payer: PPO | Admitting: Family

## 2023-02-10 DIAGNOSIS — D72829 Elevated white blood cell count, unspecified: Secondary | ICD-10-CM

## 2023-02-10 DIAGNOSIS — K2270 Barrett's esophagus without dysplasia: Secondary | ICD-10-CM

## 2023-02-10 DIAGNOSIS — R634 Abnormal weight loss: Secondary | ICD-10-CM

## 2023-02-10 DIAGNOSIS — R972 Elevated prostate specific antigen [PSA]: Secondary | ICD-10-CM

## 2023-02-10 NOTE — Assessment & Plan Note (Signed)
Repeating PSA given new onset weight loss

## 2023-02-10 NOTE — Telephone Encounter (Signed)
Call to wife and she states at visit tomorrow she does not want any memory testing done. Patient has declined since last visit and she is concerned that not doing well will effect him. Advised I will make a note and update Dr. Teresa Coombs. Wife appreciative of call.

## 2023-02-10 NOTE — Telephone Encounter (Signed)
Patient's wife, Clemmon Brooke, called to state patient has been losing a lot of weight in a short period of time.  Corrie Dandy states patient still has his appetite and is eating like he is starved, but he is skin and bones.  Corrie Dandy states patient states he has been going to the bathroom frequently, but Corrie Dandy states she has not discussed it in detail with him, so she does not know if he is having diarrhea.    Patient was scheduled for an appointment with Worthy Rancher, FNP, for today, but she is only seeing acute patients today.  We rescheduled appointment for 02/11/2023 at 2:30pm, but patient is unable to make it at that time, so we cancelled this appointment.  Patient does have an appointment to see Dr. Duncan Dull on 02/18/2023.

## 2023-02-10 NOTE — Assessment & Plan Note (Addendum)
Screenings reviewed:  normal colonoscopy Apri 2023  history of TA removed in 2020.  Follow up 2028 PSA was rising and he was referred to Urology , underwent MRI prostate In 2023 . No evidence of malignancy Barrett's Esophagus follow up EGD Oct 2024 no dysplasia

## 2023-02-10 NOTE — Telephone Encounter (Signed)
Does pt pt need to be seen sooner than 11/20/224?

## 2023-02-11 ENCOUNTER — Encounter: Payer: Self-pay | Admitting: Neurology

## 2023-02-11 ENCOUNTER — Other Ambulatory Visit (INDEPENDENT_AMBULATORY_CARE_PROVIDER_SITE_OTHER): Payer: PPO

## 2023-02-11 ENCOUNTER — Ambulatory Visit: Payer: PPO | Admitting: Internal Medicine

## 2023-02-11 ENCOUNTER — Ambulatory Visit: Payer: PPO | Admitting: Neurology

## 2023-02-11 ENCOUNTER — Encounter: Payer: Self-pay | Admitting: Internal Medicine

## 2023-02-11 VITALS — BP 153/75 | HR 55 | Resp 15 | Wt 149.5 lb

## 2023-02-11 VITALS — BP 136/75 | HR 47 | Ht 72.0 in | Wt 149.0 lb

## 2023-02-11 DIAGNOSIS — G301 Alzheimer's disease with late onset: Secondary | ICD-10-CM | POA: Diagnosis not present

## 2023-02-11 DIAGNOSIS — F02A3 Dementia in other diseases classified elsewhere, mild, with mood disturbance: Secondary | ICD-10-CM | POA: Diagnosis not present

## 2023-02-11 DIAGNOSIS — D649 Anemia, unspecified: Secondary | ICD-10-CM | POA: Diagnosis not present

## 2023-02-11 DIAGNOSIS — R634 Abnormal weight loss: Secondary | ICD-10-CM

## 2023-02-11 DIAGNOSIS — R972 Elevated prostate specific antigen [PSA]: Secondary | ICD-10-CM

## 2023-02-11 DIAGNOSIS — D72829 Elevated white blood cell count, unspecified: Secondary | ICD-10-CM | POA: Diagnosis not present

## 2023-02-11 DIAGNOSIS — K2270 Barrett's esophagus without dysplasia: Secondary | ICD-10-CM

## 2023-02-11 DIAGNOSIS — G20A1 Parkinson's disease without dyskinesia, without mention of fluctuations: Secondary | ICD-10-CM | POA: Diagnosis not present

## 2023-02-11 DIAGNOSIS — G3184 Mild cognitive impairment, so stated: Secondary | ICD-10-CM | POA: Diagnosis not present

## 2023-02-11 LAB — CBC WITH DIFFERENTIAL/PLATELET
Basophils Absolute: 0 10*3/uL (ref 0.0–0.1)
Basophils Relative: 0.9 % (ref 0.0–3.0)
Eosinophils Absolute: 0.1 10*3/uL (ref 0.0–0.7)
Eosinophils Relative: 1.4 % (ref 0.0–5.0)
HCT: 36.9 % — ABNORMAL LOW (ref 39.0–52.0)
Hemoglobin: 12.7 g/dL — ABNORMAL LOW (ref 13.0–17.0)
Lymphocytes Relative: 24.9 % (ref 12.0–46.0)
Lymphs Abs: 1.4 10*3/uL (ref 0.7–4.0)
MCHC: 34.5 g/dL (ref 30.0–36.0)
MCV: 96.6 fL (ref 78.0–100.0)
Monocytes Absolute: 0.4 10*3/uL (ref 0.1–1.0)
Monocytes Relative: 6.3 % (ref 3.0–12.0)
Neutro Abs: 3.8 10*3/uL (ref 1.4–7.7)
Neutrophils Relative %: 66.5 % (ref 43.0–77.0)
Platelets: 242 10*3/uL (ref 150.0–400.0)
RBC: 3.82 Mil/uL — ABNORMAL LOW (ref 4.22–5.81)
RDW: 12.8 % (ref 11.5–15.5)
WBC: 5.7 10*3/uL (ref 4.0–10.5)

## 2023-02-11 LAB — COMPREHENSIVE METABOLIC PANEL
ALT: 9 U/L (ref 0–53)
AST: 18 U/L (ref 0–37)
Albumin: 4.2 g/dL (ref 3.5–5.2)
Alkaline Phosphatase: 60 U/L (ref 39–117)
BUN: 20 mg/dL (ref 6–23)
CO2: 31 meq/L (ref 19–32)
Calcium: 9.3 mg/dL (ref 8.4–10.5)
Chloride: 102 meq/L (ref 96–112)
Creatinine, Ser: 0.93 mg/dL (ref 0.40–1.50)
GFR: 82.32 mL/min (ref >60.00)
Glucose, Bld: 90 mg/dL (ref 70–99)
Potassium: 4.5 meq/L (ref 3.5–5.1)
Sodium: 139 meq/L (ref 135–145)
Total Bilirubin: 0.6 mg/dL (ref 0.2–1.2)
Total Protein: 6.5 g/dL (ref 6.0–8.3)

## 2023-02-11 LAB — POCT GLYCOSYLATED HEMOGLOBIN (HGB A1C): Hemoglobin A1C: 5.2 % (ref 4.0–5.6)

## 2023-02-11 LAB — TSH: TSH: 2.33 u[IU]/mL (ref 0.35–5.50)

## 2023-02-11 LAB — PSA: PSA: 6.71 ng/mL — ABNORMAL HIGH (ref 0.10–4.00)

## 2023-02-11 MED ORDER — ESCITALOPRAM OXALATE 5 MG PO TABS: 5.0000 mg | ORAL_TABLET | Freq: Every day | ORAL | 0 refills | Status: DC

## 2023-02-11 MED ORDER — RABEPRAZOLE SODIUM 20 MG PO TBEC: 20.0000 mg | DELAYED_RELEASE_TABLET | Freq: Every day | ORAL | 3 refills | Status: AC

## 2023-02-11 NOTE — Assessment & Plan Note (Signed)
Diagnosed by his neurologist.  Taking sinemet.

## 2023-02-11 NOTE — Telephone Encounter (Signed)
Pt scheduled an appt for today and stated he would get his lab done at [facility] lab.

## 2023-02-11 NOTE — Progress Notes (Signed)
Subjective:  Patient ID: [name] [name], male    DOB: 06-15-1950  Age: 72 y.o. MRN: [id number]  CC: The primary encounter diagnosis was Unintentional weight loss of 10% body weight within 6 months. Diagnoses of Barrett's esophagus without dysplasia, Parkinson's disease, unspecified whether dyskinesia present, unspecified whether manifestations fluctuate (HCC), Mild cognitive impairment, Anemia, unspecified type, and Elevated PSA were also pertinent to this visit.   HPI BHUPINDER PLAGENS presents for  Chief Complaint  Patient presents with   Medical Management of Chronic Issues    Follow up on anxiety and weight loss despite no change in appetite.     [name] [name] is a 72 yr old male with BPH,  elevated PSA,  Barrett;'s esophagus and   dementia  (early )who presents with unintentional weight loss of 23 lbs  over the last year. His wife [name] [name]  first noticed the weight loss  in the spring and notes that he has lost  muscle mass  despite a "very healthy " appetite. He denies early satiety,   night sweats,  cough,  and change in stools. He typically has 3 formed stools daily.  He has been having  pain in his left iliac crest for the past several months rwhich is relieved by acitivity  .  Has been craving sugary foods but denies nocturia/polyuria     He remains physically active,  and is riding a bicycle and  [place] daily  for exercise His wife thinks he is walking shorter distances,  but he denies exertional fatigue   Lab Results  Component Value Date   HGBA1C 5.2 02/11/2023   He was seen by his neurologist today who has prescribed Sinemet for management of symptoms consistent with Parkinson's  .Disease   He was also prescribed Rivastigmine today by his neurologist. For diagnosis of Alzheimers Dementia . His wife has been giving him acetylcholine supplements and mucuna pruriens extract  (approved by neurologist but stopped as of today now that he is going to start taking rivastiigmine).   Outpatient  Medications Prior to Visit  Medication Sig Dispense Refill   Acetylcholine Chloride POWD 60 mg by Does not apply route daily.     carbidopa-levodopa (SINEMET IR) 25-100 MG tablet TAKE 1 TABLET BY MOUTH 3 TIMES DAILY 90 tablet 6   CITRUS BERGAMOT PO Take 1 tablet by mouth daily.     co-enzyme Q-10 30 MG capsule Take 30 mg by mouth 3 (three) times daily.     Docosahexaenoic Acid (DHA) 200 MG CAPS Take 1 tablet by mouth daily.     ketoconazole (NIZORAL) 2 % shampoo Apply 1 Application topically 2 (two) times a week.     LUTEIN PO Take 1 tablet by mouth daily.     medium chain triglycerides (MCT OIL) oil Take 15 mLs by mouth 3 (three) times daily.     Misc Natural Products (PROSTATE HEALTH PO) Take by mouth.     Omega-3 Fatty Acids (FISH OIL) 300 MG CAPS Take 1 capsule by mouth daily.     POTASSIUM BICARBONATE PO Take 1 tablet by mouth daily.     tamsulosin (FLOMAX) 0.4 MG CAPS capsule TAKE 1 CAPSULE BY MOUTH ONCE DAILY 90 capsule 3   Thiamine HCl (B-1) 100 MG TABS Take 1 tablet by mouth daily.     traZODone (DESYREL) 100 MG tablet Take 1 tablet (100 mg total) by mouth at bedtime. 90 tablet 1   Turmeric (QC TUMERIC COMPLEX PO) Take 1 capsule by mouth daily.  vitamin B-12 (CYANOCOBALAMIN) 100 MCG tablet Take 100 mcg by mouth daily.     vitamin E 45 MG (100 UNITS) capsule Take 100 Units by mouth daily.     ZINC PICOLINATE PO Take 1 capsule by mouth daily.     escitalopram (LEXAPRO) 5 MG tablet TAKE 1 TABLET BY MOUTH ONCE DAILY 90 tablet 0   RABEprazole (ACIPHEX) 20 MG tablet Take 1 tablet (20 mg total) by mouth daily. 30 min before breakfast or dinner 90 tablet 3   No facility-administered medications prior to visit.    Review of Systems;  Patient denies headache, fevers, malaise, unintentional weight loss, skin rash, eye pain, sinus congestion and sinus pain, sore throat, dysphagia,  hemoptysis , cough, dyspnea, wheezing, chest pain, palpitations, orthopnea, edema, abdominal pain, nausea,  melena, diarrhea, constipation, flank pain, dysuria, hematuria, urinary  Frequency, nocturia, numbness, tingling, seizures,  Focal weakness, Loss of consciousness,  Tremor, insomnia, depression, anxiety, and suicidal ideation.      Objective:  BP 136/75   Pulse (!) 47   Ht 6' (1.829 m)   Wt 149 lb (67.6 kg)   SpO2 99%   BMI 20.21 kg/m   BP Readings from Last 3 Encounters:  02/11/23 136/75  02/11/23 (!) 153/75  11/25/22 (!) 172/81    Wt Readings from Last 3 Encounters:  02/11/23 149 lb (67.6 kg)  02/11/23 149 lb 8 oz (67.8 kg)  11/25/22 158 lb (71.7 kg)    Physical Exam Vitals reviewed.  Constitutional:      General: He is not in acute distress.    Appearance: Normal appearance. He is underweight. He is not ill-appearing, toxic-appearing or diaphoretic.  HENT:     Head: Normocephalic.  Eyes:     General: No scleral icterus.       Right eye: No discharge.        Left eye: No discharge.     Conjunctiva/sclera: Conjunctivae normal.  Cardiovascular:     Rate and Rhythm: Normal rate and regular rhythm.     Heart sounds: Normal heart sounds.  Pulmonary:     Effort: Pulmonary effort is normal. No respiratory distress.     Breath sounds: Normal breath sounds.  Musculoskeletal:        General: Normal range of motion.     Cervical back: Normal range of motion.  Skin:    General: Skin is warm and dry.  Neurological:     General: No focal deficit present.     Mental Status: He is alert and oriented to person, place, and time. Mental status is at baseline.  Psychiatric:        Mood and Affect: Mood normal.        Behavior: Behavior normal.        Thought Content: Thought content normal.        Judgment: Judgment normal.    Lab Results  Component Value Date   HGBA1C 5.2 02/11/2023   HGBA1C 5.2 10/30/2022    Lab Results  Component Value Date   CREATININE 0.93 02/11/2023   CREATININE 1.08 10/30/2022   CREATININE 0.95 04/28/2022    Lab Results  Component Value  Date   WBC 5.7 02/11/2023   HGB 12.7 (L) 02/11/2023   HCT 36.9 (L) 02/11/2023   PLT 242.0 02/11/2023   GLUCOSE 90 02/11/2023   CHOL 194 10/30/2022   TRIG 82.0 10/30/2022   HDL 54.80 10/30/2022   LDLDIRECT 126.0 10/30/2022   LDLCALC 123 (H) 10/30/2022   ALT 9  02/11/2023   AST 18 02/11/2023   NA 139 02/11/2023   K 4.5 02/11/2023   CL 102 02/11/2023   CREATININE 0.93 02/11/2023   BUN 20 02/11/2023   CO2 31 02/11/2023   TSH 2.33 02/11/2023   PSA 6.71 (H) 02/11/2023   HGBA1C 5.2 02/11/2023   MICROALBUR <0.7 10/30/2022    NM BRAIN DATSCAN TUMOR LOC INFLAM SPECT 1 DAY  Result Date: 06/17/2022 CLINICAL DATA:  72 year old male with memory loss. Bradykinesia and mask face ease. EXAM: NUCLEAR MEDICINE BRAIN IMAGING WITH SPECT  (DaTscan ) TECHNIQUE: SPECT images of the brain were obtained after intravenous injection of radiopharmaceutical. 4 hour post injection imaging. Appropriate positioning. 130 mg IO STAT given orally for thyroid blockade. RADIOPHARMACEUTICALS:  4.5 millicuries I 123 Ioflupane COMPARISON:  None Available. FINDINGS: Mild truncation of radiotracer activity in the posterior RIGHT putamen. There is decrease activity in head of the RIGHT caudate nucleus compared to the LEFT. Overall reduced RIGHT striatal activity compared to LEFT. IMPRESSION: Asymmetric decreased radiotracer activity in the RIGHT striatum compared to the LEFT. Findings equivocal but concerning for Parkinson's syndrome pathology. Of note, DaTSCAN is not diagnostic of Parkinsonian syndromes, which remains a clinical diagnosis. DaTscan is an adjuvant test to aid in the clinical diagnosis of Parkinsonian syndromes. Electronically Signed   By: Genevive Bi M.D.   On: 06/17/2022 15:55    Assessment & Plan:  .Unintentional weight loss of 10% body weight within 6 months Assessment & Plan: With new onset anemia ,  elevated PSA,  and right iliac crest pain.  CT chest abd and pelvis ordered to rule out  occult  malignancy.   Lab Results  Component Value Date   HGBA1C 5.2 10/30/2022   Lab Results  Component Value Date   TSH 2.33 02/11/2023   Lab Results  Component Value Date   WBC 5.7 02/11/2023   HGB 12.7 (L) 02/11/2023   HCT 36.9 (L) 02/11/2023   MCV 96.6 02/11/2023   PLT 242.0 02/11/2023   Lab Results  Component Value Date   ALT 9 02/11/2023   AST 18 02/11/2023   ALKPHOS 60 02/11/2023   BILITOT 0.6 02/11/2023   Lab Results  Component Value Date   CREATININE 0.93 02/11/2023     Orders: -     CT CHEST W CONTRAST; Future -     CT ABDOMEN PELVIS W CONTRAST; Future -     POCT glycosylated hemoglobin (Hb A1C)  Barrett's esophagus without dysplasia -     RABEprazole Sodium; Take 1 tablet (20 mg total) by mouth daily. 30 min before breakfast or dinner  Dispense: 90 tablet; Refill: 3  Parkinson's disease, unspecified whether dyskinesia present, unspecified whether manifestations fluctuate (HCC) Assessment & Plan: Diagnosed by his neurologist.  Taking sinemet.    Mild cognitive impairment Assessment & Plan: His neurologist is starting rivastigmine as of today for worsening cognitive issues.    Anemia, unspecified type -     CT CHEST W CONTRAST; Future -     CT ABDOMEN PELVIS W CONTRAST; Future  Elevated PSA -     CT CHEST W CONTRAST; Future -     CT ABDOMEN PELVIS W CONTRAST; Future  Other orders -     Escitalopram Oxalate; Take 1 tablet (5 mg total) by mouth daily.  Dispense: 90 tablet; Refill: 0     I provided 51 minutes of face-to-face time during this encounter reviewing patient's last visit with me, patient's  most recent visit with  neurology,  recent surgical and non surgical procedures, previous  labs and imaging studies, counseling on currently addressed issues,  and post visit ordering to diagnostics and therapeutics .   Follow-up: No follow-ups on file.   Sherlene Shams, MD

## 2023-02-11 NOTE — Progress Notes (Signed)
[facility]  PATIENT: [name] [name] DOB: Mar 28, 1951  REQUESTING CLINICIAN: Sherlene Shams, MD HISTORY FROM: Patient and spouse  REASON FOR VISIT: Memory decline    HISTORICAL  CHIEF COMPLAINT:  Chief Complaint  Patient presents with   Memory Loss    Rm12, wife present, Memory loss: no test per wife. Pt stated that he gets frustrated w/himself because he knows what he wants to say but the words aren't coming out   INTERVAL HISTORY 02/11/2023 Patient presents today for follow-up, he is accompanied by wife.  Last visit was in January, at that time we obtained the ATN profile for his cognitive impairment and it was positive for Alzheimer disease biomarker.  We opted not to start him on medication since he was bradycardic.  During our visit in January, he also exhibited signs concerning for Parkinson disease.  DaTscan was obtained and was positive.  Patient started on Sinemet, since starting the Sinemet, wife tells me that his movement is better, his walking and gait is better. In terms of the memory, they report it is worsening, he has difficulty with short-term memory.  He is having issues balancing his checkbook. Today, he did have difficulty setting up the thermostat. He is also complaining of difficulty writing and spelling, wife tells me that he could not figure out how to mail a check, where to put the port staging and follow-up.  HISTORY OF PRESENT ILLNESS:  This is a 72 year old gentleman past medical history hypertension, hyperlipidemia, insomnia on trazodone who is presenting with memory problem.  Patient reports memory problem has been going on for the past 2 years.  He does have issues with short-term memory, word finding difficulty and that really upset him.  Sometimes he is very frustrated.  He lives with his wife, he is able to perform activities of daily living.  Wife reports that he does not repeat himself and he does not need constant reminders.  He still  drives, denies being lost in familiar places and denies any recent accident.  He still handles his bills.  He did does not report any trouble with family members names.   TBI: Yes, in his 30s.  Stroke:    no past history of stroke Seizures:   no past history of seizures Sleep:  Diagnosed with sleep apnea but could not tolerate CPAP.   Mood: patient has  anxiety and depression Family history of Dementia: Mother with dementia in her 31  Functional status: independent in all ADLs and IADLs Patient lives with Spouse . Cooking: No  Cleaning: yes Shopping: Yes, with a list  Bathing: yes, no help needed Toileting: yes, no help needed Driving: Yes, no accident, has been lost  Bills: Patient   Ever left the stove on by accident?: N/A Forget how to use items around the house?: Denies  Getting lost going to familiar places?: Denies  Forgetting loved ones names?: Denies  Word finding difficulty? Yes  Sleep: Good with Trazodone    OTHER MEDICAL CONDITIONS: Hypertension, hyperlipidemia, Insomnia    REVIEW OF SYSTEMS: Full 14 system review of systems performed and negative with exception of: As noted in the HPI   ALLERGIES: Allergies  Allergen Reactions   Omeprazole Diarrhea   Statins Other (See Comments)    Fatigue,muscle aches  Other Reaction(s): Other (See Comments)    HOME MEDICATIONS: Outpatient Medications Prior to Visit  Medication Sig Dispense Refill   Acetylcholine Chloride POWD 60 mg by Does not apply route daily.  carbidopa-levodopa (SINEMET IR) 25-100 MG tablet TAKE 1 TABLET BY MOUTH 3 TIMES DAILY 90 tablet 6   CITRUS BERGAMOT PO Take 1 tablet by mouth daily.     co-enzyme Q-10 30 MG capsule Take 30 mg by mouth 3 (three) times daily.     Docosahexaenoic Acid (DHA) 200 MG CAPS Take 1 tablet by mouth daily.     escitalopram (LEXAPRO) 5 MG tablet TAKE 1 TABLET BY MOUTH ONCE DAILY 90 tablet 0   ketoconazole (NIZORAL) 2 % shampoo Apply 1 Application topically 2 (two)  times a week.     LUTEIN PO Take 1 tablet by mouth daily.     medium chain triglycerides (MCT OIL) oil Take 15 mLs by mouth 3 (three) times daily.     Misc Natural Products (PROSTATE HEALTH PO) Take by mouth.     Omega-3 Fatty Acids (FISH OIL) 300 MG CAPS Take 1 capsule by mouth daily.     POTASSIUM BICARBONATE PO Take 1 tablet by mouth daily.     RABEprazole (ACIPHEX) 20 MG tablet Take 1 tablet (20 mg total) by mouth daily. 30 min before breakfast or dinner 90 tablet 3   tamsulosin (FLOMAX) 0.4 MG CAPS capsule TAKE 1 CAPSULE BY MOUTH ONCE DAILY 90 capsule 3   Thiamine HCl (B-1) 100 MG TABS Take 1 tablet by mouth daily.     traZODone (DESYREL) 100 MG tablet Take 1 tablet (100 mg total) by mouth at bedtime. 90 tablet 1   Turmeric (QC TUMERIC COMPLEX PO) Take 1 capsule by mouth daily.     vitamin B-12 (CYANOCOBALAMIN) 100 MCG tablet Take 100 mcg by mouth daily.     vitamin E 45 MG (100 UNITS) capsule Take 100 Units by mouth daily.     ZINC PICOLINATE PO Take 1 capsule by mouth daily.     No facility-administered medications prior to visit.    PAST MEDICAL HISTORY: 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 and some scarring in left eye but not blind    COVID-19    03/2020   Depression    remote h/o suicide ideation    GERD (gastroesophageal reflux disease)    Hypertension    Sleep apnea    Syncope 05/20/2017    PAST SURGICAL HISTORY: Past Surgical History:  Procedure Laterality Date   COLONOSCOPY WITH PROPOFOL N/A 04/01/2018   Procedure: COLONOSCOPY WITH PROPOFOL;  Surgeon: Christena Deem, MD;  Location: [facility] ENDOSCOPY;  Service: Endoscopy;  Laterality: N/A;   COLONOSCOPY WITH PROPOFOL N/A 07/02/2021   Procedure: COLONOSCOPY WITH PROPOFOL;  Surgeon: Regis Bill, MD;  Location: [facility] ENDOSCOPY;  Service: Endoscopy;  Laterality: N/A;   ESOPHAGOGASTRODUODENOSCOPY N/A 04/01/2018   Procedure: ESOPHAGOGASTRODUODENOSCOPY (EGD);  Surgeon:  Christena Deem, MD;  Location: [facility] ENDOSCOPY;  Service: Endoscopy;  Laterality: N/A;   ESOPHAGOGASTRODUODENOSCOPY (EGD) WITH PROPOFOL N/A 07/02/2021   Procedure: ESOPHAGOGASTRODUODENOSCOPY (EGD) WITH PROPOFOL;  Surgeon: Regis Bill, MD;  Location: [facility] ENDOSCOPY;  Service: Endoscopy;  Laterality: N/A;   ESOPHAGOGASTRODUODENOSCOPY (EGD) WITH PROPOFOL N/A 10/07/2021   Procedure: ESOPHAGOGASTRODUODENOSCOPY (EGD) WITH PROPOFOL;  Surgeon: Regis Bill, MD;  Location: [facility] ENDOSCOPY;  Service: Endoscopy;  Laterality: N/A;   HEMORROIDECTOMY  1980   VASECTOMY      FAMILY HISTORY: Family History  Problem Relation Age of Onset   Dementia Mother    Stroke Mother        39s   Cancer Father  colon cancer   Stroke Father    Heart attack Father        h/o cig smoker    Prostate cancer Father        ?   Heart disease Father    Other Father        smoker   Cancer Brother        colon cancer dx'ed age 77 as of 06/2021   Esophageal cancer Brother    Heart attack Paternal Grandmother     SOCIAL HISTORY: Social History   Socioeconomic History   Marital status: Married    Spouse name: Geologist, engineering   Number of children: 2   Years of education: 12   Highest education level: Associate degree: academic program  Occupational History   Occupation: retired  Tobacco Use   Smoking status: Never    Passive exposure: Never   Smokeless tobacco: Never  Vaping Use   Vaping status: Never Used  Substance and Sexual Activity   Alcohol use: Yes    Alcohol/week: 0.0 standard drinks of alcohol   Drug use: No   Sexual activity: Yes    Partners: Female  Other Topics Concern   Not on file  Social History Narrative   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 Wynton Elstad    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    Never smoker    Wears eye glasses   Social Determinants of Health   Financial Resource Strain: Low Risk  (06/25/2021)   Overall Financial Resource Strain (CARDIA)    Difficulty of  Paying Living Expenses: Not hard at all  Food Insecurity: No Food Insecurity (06/25/2021)   Hunger Vital Sign    Worried About Running Out of Food in the Last Year: Never true    Ran Out of Food in the Last Year: Never true  Transportation Needs: No Transportation Needs (06/25/2021)   PRAPARE - Administrator, Civil Service (Medical): No    Lack of Transportation (Non-Medical): No  Physical Activity: Sufficiently Active (06/25/2021)   Exercise Vital Sign    Days of Exercise per Week: 5 days    Minutes of Exercise per Session: 60 min  Stress: No Stress Concern Present (06/25/2021)   [employer] of Occupational Health - Occupational Stress Questionnaire    Feeling of Stress : Not at all  Social Connections: Socially Integrated (06/25/2021)   Social Connection and Isolation Panel [NHANES]    Frequency of Communication with Friends and Family: Three times a week    Frequency of Social Gatherings with Friends and Family: Three times a week    Attends Religious Services: More than 4 times per year    Active Member of Clubs or Organizations: Yes    Attends Banker Meetings: More than 4 times per year    Marital Status: Married  Catering manager Violence: Not At Risk (06/25/2021)   Humiliation, Afraid, Rape, and Kick questionnaire    Fear of Current or Ex-Partner: No    Emotionally Abused: No    Physically Abused: No    Sexually Abused: No    PHYSICAL EXAM  GENERAL EXAM/CONSTITUTIONAL: Vitals:  Vitals:   02/11/23 1405 02/11/23 1410  BP: (!) 159/74 (!) 153/75  Pulse: (!) 53 (!) 55  Resp: 15   Weight: 149 lb 8 oz (67.8 kg)    Body mass index is 20.28 kg/m. Wt Readings from Last 3 Encounters:  02/11/23 149 lb 8 oz (67.8 kg)  11/25/22 158 lb (71.7  kg)  10/30/22 158 lb (71.7 kg)   Patient is in no distress; well developed, nourished and groomed; neck is supple. He does have masked facies   MUSCULOSKELETAL: Gait, strength, tone, movements noted in  Neurologic exam below  NEUROLOGIC: MENTAL STATUS:      No data to display            04/10/2022    1:24 PM  Montreal Cognitive Assessment   Visuospatial/ Executive (0/5) 2  Naming (0/3) 2  Attention: Read list of digits (0/2) 1  Attention: Read list of letters (0/1) 0  Attention: Serial 7 subtraction starting at 100 (0/3) 1  Language: Repeat phrase (0/2) 2  Language : Fluency (0/1) 1  Abstraction (0/2) 2  Delayed Recall (0/5) 0  Orientation (0/6) 4  Total 15     CRANIAL NERVE:  2nd, 3rd, 4th, 6th- visual fields full to confrontation, extraocular muscles intact, no nystagmus 5th - facial sensation symmetric 7th - facial strength symmetric. Masked facies  8th - hearing intact 9th - palate elevates symmetrically, uvula midline 11th - shoulder shrug symmetric 12th - tongue protrusion midline  MOTOR:  normal bulk and tone, full strength in the BUE, BLE. No rigidity but there is very mild bradykinesia   SENSORY:  normal and symmetric to light touch  COORDINATION:  finger-nose-finger, fine finger movements normal. Dysdiadochokinesia present   GAIT/STATION:  Normal but he has decrease arm swing. 3 steps with the pull test.    DIAGNOSTIC DATA (LABS, IMAGING, TESTING) - I reviewed patient records, labs, notes, testing and imaging myself where available.  Lab Results  Component Value Date   WBC 4.6 10/30/2022   HGB 15.2 10/30/2022   HCT 45.1 10/30/2022   MCV 96.5 10/30/2022   PLT 213.0 10/30/2022      Component Value Date/Time   NA 139 10/30/2022 0857   NA 139 12/30/2018 0822   K 4.2 10/30/2022 0857   CL 99 10/30/2022 0857   CO2 32 10/30/2022 0857   GLUCOSE 92 10/30/2022 0857   BUN 26 (H) 10/30/2022 0857   BUN 18 12/30/2018 0822   CREATININE 1.08 10/30/2022 0857   CREATININE 0.94 08/24/2018 1017   CALCIUM 9.5 10/30/2022 0857   PROT 6.9 10/30/2022 0857   PROT 6.7 12/30/2018 0822   ALBUMIN 4.5 10/30/2022 0857   ALBUMIN 4.4 12/30/2018 0822   AST 16  10/30/2022 0857   ALT 6 10/30/2022 0857   ALKPHOS 77 10/30/2022 0857   BILITOT 0.5 10/30/2022 0857   BILITOT 0.9 12/30/2018 0822   GFRNONAA 68 12/30/2018 0822   GFRNONAA 84 08/24/2018 1017   GFRAA 78 12/30/2018 0822   GFRAA 97 08/24/2018 1017   Lab Results  Component Value Date   CHOL 194 10/30/2022   HDL 54.80 10/30/2022   LDLCALC 123 (H) 10/30/2022   LDLDIRECT 126.0 10/30/2022   TRIG 82.0 10/30/2022   CHOLHDL 4 10/30/2022   Lab Results  Component Value Date   HGBA1C 5.2 10/30/2022   Lab Results  Component Value Date   VITAMINB12 1,107 (H) 04/28/2022   Lab Results  Component Value Date   TSH 4.06 10/30/2022   Positive ATN profile consistent with presence of Alzheimer related pathology.  MRI Brain 03/11/2019 No acute or reversible finding. Mild age related volume loss. Minimal small vessel change of the hemispheric white matter. Tiny old cortical infarctions at the vertex bilaterally.  DatScan 06/17/2022 Asymmetric decreased radiotracer activity in the RIGHT striatum compared to the LEFT. Findings equivocal but concerning for  Parkinson's syndrome pathology.   ASSESSMENT AND PLAN  72 y.o. year old male with medical history including sleep apnea not on CPAP, hypertension hyperlipidemia, who is presenting for follow-up for his mild Alzheimer dementia and Parkinson disease.  For his Alzheimer dementia, we will start him on Exelon 1.5 mg twice daily, if able to tolerate, will increase to 3 milligram twice daily daily and add memantine.   In term of his Parkinson disease, we will continue him on Sinemet as prescribed.  Advised wife to contact me for any other concerns or questions.  I will see him in 1 year for follow-up or sooner if worse.    1. Mild late onset Alzheimer's dementia with mood disturbance (HCC)   2. Parkinson's disease, unspecified whether dyskinesia present, unspecified whether manifestations fluctuate (HCC)      Patient Instructions  Start Exelon 1.5 mg  twice daily. At the 3 months mark, if no side effects will increase to 3 mg twice daily and add Memantine Continue with Sinemet as prescribed  Continue your other medications  Please contact me for any other concerns. Return in a year or sooner if worse   No orders of the defined types were placed in this encounter.   No orders of the defined types were placed in this encounter.   Return in about 1 year (around 02/11/2024).    Windell Norfolk, MD 02/11/2023, 3:15 PM  [facility] [address], [address] [place], [place] [address] [phone]

## 2023-02-11 NOTE — Patient Instructions (Addendum)
Start Exelon 1.5 mg twice daily. At the 3 months mark, if no side effects will increase to 3 mg twice daily and add Memantine Continue with Sinemet as prescribed  Continue your other medications  Please contact me for any other concerns. Return in a year or sooner if worse

## 2023-02-11 NOTE — Assessment & Plan Note (Signed)
His neurologist is starting rivastigmine as of today for worsening cognitive issues.

## 2023-02-11 NOTE — Assessment & Plan Note (Addendum)
With new onset anemia ,  elevated PSA,  and right iliac crest pain.  CT chest abd and pelvis ordered to rule out  occult malignancy.   Lab Results  Component Value Date   HGBA1C 5.2 10/30/2022   Lab Results  Component Value Date   TSH 2.33 02/11/2023   Lab Results  Component Value Date   WBC 5.7 02/11/2023   HGB 12.7 (L) 02/11/2023   HCT 36.9 (L) 02/11/2023   MCV 96.6 02/11/2023   PLT 242.0 02/11/2023   Lab Results  Component Value Date   ALT 9 02/11/2023   AST 18 02/11/2023   ALKPHOS 60 02/11/2023   BILITOT 0.6 02/11/2023   Lab Results  Component Value Date   CREATININE 0.93 02/11/2023

## 2023-02-12 ENCOUNTER — Telehealth: Payer: Self-pay

## 2023-02-12 ENCOUNTER — Telehealth: Payer: Self-pay | Admitting: Internal Medicine

## 2023-02-12 ENCOUNTER — Telehealth: Payer: Self-pay | Admitting: Neurology

## 2023-02-12 MED ORDER — RIVASTIGMINE TARTRATE 1.5 MG PO CAPS: 1.5000 mg | ORAL_CAPSULE | Freq: Two times a day (BID) | ORAL | 0 refills | Status: DC

## 2023-02-12 NOTE — Telephone Encounter (Signed)
Done. Thanks.

## 2023-02-12 NOTE — Telephone Encounter (Signed)
Lft pt vm to call ofc to sch ct.thanks

## 2023-02-12 NOTE — Telephone Encounter (Signed)
Pt's wife called wanting to know when the pt's Exelon 1.5 mg will be called in for him to the [facility]. Please advise.

## 2023-02-12 NOTE — Telephone Encounter (Signed)
Mychart message sent.

## 2023-02-18 ENCOUNTER — Ambulatory Visit
Admission: RE | Admit: 2023-02-18 | Discharge: 2023-02-18 | Disposition: A | Discharge status: Home / Self Care | Payer: PPO | Source: Ambulatory Visit | Attending: Internal Medicine | Admitting: Internal Medicine

## 2023-02-18 ENCOUNTER — Ambulatory Visit: Payer: PPO | Admitting: Internal Medicine

## 2023-02-18 ENCOUNTER — Telehealth: Payer: Self-pay | Admitting: Internal Medicine

## 2023-02-18 DIAGNOSIS — D649 Anemia, unspecified: Secondary | ICD-10-CM | POA: Diagnosis not present

## 2023-02-18 DIAGNOSIS — K760 Fatty (change of) liver, not elsewhere classified: Secondary | ICD-10-CM | POA: Diagnosis not present

## 2023-02-18 DIAGNOSIS — N323 Diverticulum of bladder: Secondary | ICD-10-CM | POA: Diagnosis not present

## 2023-02-18 DIAGNOSIS — R918 Other nonspecific abnormal finding of lung field: Secondary | ICD-10-CM | POA: Insufficient documentation

## 2023-02-18 DIAGNOSIS — N281 Cyst of kidney, acquired: Secondary | ICD-10-CM | POA: Diagnosis not present

## 2023-02-18 DIAGNOSIS — R634 Abnormal weight loss: Secondary | ICD-10-CM | POA: Insufficient documentation

## 2023-02-18 DIAGNOSIS — I700 Atherosclerosis of aorta: Secondary | ICD-10-CM | POA: Diagnosis not present

## 2023-02-18 DIAGNOSIS — N400 Enlarged prostate without lower urinary tract symptoms: Secondary | ICD-10-CM | POA: Insufficient documentation

## 2023-02-18 DIAGNOSIS — R972 Elevated prostate specific antigen [PSA]: Secondary | ICD-10-CM | POA: Diagnosis not present

## 2023-02-18 MED ORDER — IOHEXOL 300 MG/ML  SOLN
85.0000 mL | Freq: Once | INTRAMUSCULAR | Status: AC | PRN
  Administered 2023-02-18: 85 mL via INTRAVENOUS

## 2023-02-18 NOTE — Telephone Encounter (Signed)
Patient's wife just called and wants to know can his provider contact [facility]. The name is [facility]. She said they are behind reading the CT Scans. It will take up to 3 weeks. She just wants to know his results before than. His number is [phone]. Imaging number is [phone]

## 2023-02-19 ENCOUNTER — Encounter: Payer: Self-pay | Admitting: Internal Medicine

## 2023-02-19 ENCOUNTER — Telehealth: Payer: Self-pay

## 2023-02-19 DIAGNOSIS — K22710 Barrett's esophagus with low grade dysplasia: Secondary | ICD-10-CM | POA: Diagnosis not present

## 2023-02-19 NOTE — Telephone Encounter (Signed)
CT scans have been resulted.

## 2023-02-19 NOTE — Telephone Encounter (Signed)
Patient's wife, Taidyn Boesch, asked that we please call her to let her know when Dr. Duncan Dull expedites the order.

## 2023-02-19 NOTE — Telephone Encounter (Signed)
Spoke with the reading room and they are moving up his CT scan for reading. Should be resulted this evening.

## 2023-02-19 NOTE — Telephone Encounter (Signed)
Patient's wife, Choua Reisman, called to state patient had a CT scan done.  Corrie Dandy states they were told unless Dr. Duncan Dull contacts them and expedites it, then it will be three weeks before they have the results of the scan.  Corrie Dandy states Micael Hampshire is the person she spoke with at the [facility] and she told Corrie Dandy to ask Dr. Darrick Huntsman to send the request through Epic directly to her and she will expedite the reading of patient's scan.

## 2023-02-19 NOTE — Telephone Encounter (Signed)
LMTCB

## 2023-02-20 ENCOUNTER — Encounter: Payer: Self-pay | Admitting: Internal Medicine

## 2023-02-20 ENCOUNTER — Ambulatory Visit: Payer: PPO

## 2023-02-20 NOTE — Telephone Encounter (Signed)
Error

## 2023-02-23 ENCOUNTER — Other Ambulatory Visit: Payer: Self-pay | Admitting: Internal Medicine

## 2023-02-23 ENCOUNTER — Other Ambulatory Visit: Payer: PPO

## 2023-02-23 ENCOUNTER — Encounter: Payer: Self-pay | Admitting: Internal Medicine

## 2023-02-23 ENCOUNTER — Telehealth: Payer: Self-pay | Admitting: Neurology

## 2023-02-23 DIAGNOSIS — R918 Other nonspecific abnormal finding of lung field: Secondary | ICD-10-CM

## 2023-02-23 NOTE — Telephone Encounter (Signed)
Pt's wife called wanting to know if she can speak to the Provider regarding some concerning changes in the pt's health. She would like to discuss some concerning changes

## 2023-02-23 NOTE — Telephone Encounter (Signed)
Exelon can cause loss of appetite which can lead to weight loss. If he is having an increase appetite, then I doubt the Rivastigmine is causing the weight loss

## 2023-02-23 NOTE — Telephone Encounter (Signed)
Call to wife and she reports patient does not seem to be improving on exelon and he has had weight loss of 30 lbs over last several months, despite an increased appetite. She states they are doing further workup with PCP and other providers. She is also wondering if acetylcholine could contribute to his weight loss. She has been giving him that supplement for a while to help with memory.Advised I would send to Dr. Teresa Coombs for review. She was appreciative of call

## 2023-02-24 ENCOUNTER — Other Ambulatory Visit (INDEPENDENT_AMBULATORY_CARE_PROVIDER_SITE_OTHER): Payer: PPO

## 2023-02-24 DIAGNOSIS — D649 Anemia, unspecified: Secondary | ICD-10-CM

## 2023-02-24 DIAGNOSIS — R634 Abnormal weight loss: Secondary | ICD-10-CM

## 2023-02-25 ENCOUNTER — Telehealth: Payer: Self-pay | Admitting: Internal Medicine

## 2023-02-25 ENCOUNTER — Telehealth: Payer: Self-pay | Admitting: Neurology

## 2023-02-25 ENCOUNTER — Other Ambulatory Visit: Payer: Self-pay | Admitting: Internal Medicine

## 2023-02-25 ENCOUNTER — Other Ambulatory Visit: Payer: Self-pay | Admitting: Neurology

## 2023-02-25 DIAGNOSIS — R634 Abnormal weight loss: Secondary | ICD-10-CM

## 2023-02-25 DIAGNOSIS — G309 Alzheimer's disease, unspecified: Secondary | ICD-10-CM

## 2023-02-25 LAB — IRON,TIBC AND FERRITIN PANEL
%SAT: 12 % — ABNORMAL LOW (ref 20–48)
Ferritin: 13 ng/mL — ABNORMAL LOW (ref 24–380)
Iron: 37 ug/dL — ABNORMAL LOW (ref 50–180)
TIBC: 311 ug/dL (ref 250–425)

## 2023-02-25 LAB — B12 AND FOLATE PANEL
Folate: 12.4 ng/mL (ref >5.9)
Vitamin B-12: 378 pg/mL (ref 211–911)

## 2023-02-25 MED ORDER — IRON (FERROUS SULFATE) 325 (65 FE) MG PO TABS: 325.0000 mg | ORAL_TABLET | Freq: Every day | ORAL | 2 refills | Status: DC

## 2023-02-25 NOTE — Assessment & Plan Note (Addendum)
Etiology worrisome for GI CA. Given new onset iron deficiency and h/o barretts esophagus.  Will touch base with Dr Mia Creek.  Advised patient's wife to supplement his diet with additional 30 grm protein,  and start iron supplement orrlaly.  If not tolerated wil referr for IV iron infusions  Lab Results  Component Value Date   IRON 37 (L) 02/24/2023   TIBC 311 02/24/2023   FERRITIN 13 (L) 02/24/2023   Lab Results  Component Value Date   WBC 5.7 02/11/2023   HGB 12.7 (L) 02/11/2023   HCT 36.9 (L) 02/11/2023   MCV 96.6 02/11/2023   PLT 242.0 02/11/2023

## 2023-02-25 NOTE — Telephone Encounter (Signed)
Patient's wife walked in and asked can Dr. Darrick Huntsman call her when she can. She said her husband's blood work came back yesterday not good. She would like for her to call her to let her know what the next step is they can do for her husband. Her number is [phone].

## 2023-02-25 NOTE — Telephone Encounter (Signed)
MRI Brain ordered.

## 2023-02-25 NOTE — Telephone Encounter (Signed)
Call to wife and advised of MRI being ordered and Dr. Teresa Coombs insight on the Exelon. Wife verbalized understanding. She stated he had additional lab work yesterday with low iron and other low values. PCP following labs

## 2023-02-25 NOTE — Telephone Encounter (Signed)
[facility] adv NPR sent to GI [phone]

## 2023-02-26 NOTE — Assessment & Plan Note (Signed)
Workup in progress.  CT Chest abd and pelvis unrevealing except for heterogenous prostate.  PSA is > 6,  iron deficiency anemia new. Marland Kitchen FOBT pending.  Discussed with patient's urologist Apolinar Junes) ,  gastroenterologist Mia Creek) , and wife . Recommend increase protein in diet using supplements and follow up with GI in office

## 2023-03-02 ENCOUNTER — Ambulatory Visit (INDEPENDENT_AMBULATORY_CARE_PROVIDER_SITE_OTHER): Payer: PPO

## 2023-03-02 DIAGNOSIS — K219 Gastro-esophageal reflux disease without esophagitis: Secondary | ICD-10-CM | POA: Diagnosis not present

## 2023-03-02 DIAGNOSIS — D649 Anemia, unspecified: Secondary | ICD-10-CM

## 2023-03-02 DIAGNOSIS — R634 Abnormal weight loss: Secondary | ICD-10-CM | POA: Diagnosis not present

## 2023-03-02 DIAGNOSIS — K22710 Barrett's esophagus with low grade dysplasia: Secondary | ICD-10-CM | POA: Diagnosis not present

## 2023-03-02 DIAGNOSIS — E611 Iron deficiency: Secondary | ICD-10-CM | POA: Diagnosis not present

## 2023-03-02 LAB — FECAL OCCULT BLOOD, IMMUNOCHEMICAL: Fecal Occult Bld: NEGATIVE

## 2023-03-03 ENCOUNTER — Encounter: Payer: Self-pay | Admitting: Internal Medicine

## 2023-03-03 DIAGNOSIS — R634 Abnormal weight loss: Secondary | ICD-10-CM

## 2023-03-05 ENCOUNTER — Ambulatory Visit
Admission: RE | Admit: 2023-03-05 | Discharge: 2023-03-05 | Disposition: A | Discharge status: Home / Self Care | Payer: PPO | Source: Ambulatory Visit | Attending: Neurology | Admitting: Neurology

## 2023-03-05 DIAGNOSIS — I6782 Cerebral ischemia: Secondary | ICD-10-CM | POA: Diagnosis not present

## 2023-03-05 DIAGNOSIS — G319 Degenerative disease of nervous system, unspecified: Secondary | ICD-10-CM | POA: Diagnosis not present

## 2023-03-05 DIAGNOSIS — R9082 White matter disease, unspecified: Secondary | ICD-10-CM | POA: Diagnosis not present

## 2023-03-05 DIAGNOSIS — G309 Alzheimer's disease, unspecified: Secondary | ICD-10-CM

## 2023-03-09 ENCOUNTER — Telehealth: Payer: Self-pay | Admitting: Neurology

## 2023-03-09 NOTE — Telephone Encounter (Signed)
Called and spoke to wife and explained that they need to go contact pcp or urgent care for uti speimen. Pt wife voiced gratitude and understanding.

## 2023-03-09 NOTE — Telephone Encounter (Signed)
Please add patient to the cancellation list

## 2023-03-09 NOTE — Telephone Encounter (Signed)
Pt's wife called, have seen MRI results on MyChart and have some concerns to discuss would like a call back.  rivastigmine (EXELON) 1.5 MG capsule Is not working at all. Want to know if can switch to the other medication before being on the on Rivastigimine for 90 days. May depend on the results from the MRI.
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
## 2023-03-09 NOTE — Telephone Encounter (Signed)
Pt's wife asking if a order for UTI can be sent to a [facility] near them to be done instead of going to PCP. Requesting call back.

## 2023-03-10 ENCOUNTER — Encounter: Payer: Self-pay | Admitting: Family Medicine

## 2023-03-10 ENCOUNTER — Ambulatory Visit (INDEPENDENT_AMBULATORY_CARE_PROVIDER_SITE_OTHER): Payer: PPO | Admitting: Family Medicine

## 2023-03-10 VITALS — BP 110/70 | HR 51 | Temp 97.6°F | Ht 72.0 in | Wt 153.2 lb

## 2023-03-10 DIAGNOSIS — R351 Nocturia: Secondary | ICD-10-CM | POA: Diagnosis not present

## 2023-03-10 DIAGNOSIS — F02A0 Dementia in other diseases classified elsewhere, mild, without behavioral disturbance, psychotic disturbance, mood disturbance, and anxiety: Secondary | ICD-10-CM

## 2023-03-10 DIAGNOSIS — N401 Enlarged prostate with lower urinary tract symptoms: Secondary | ICD-10-CM | POA: Diagnosis not present

## 2023-03-10 DIAGNOSIS — R350 Frequency of micturition: Secondary | ICD-10-CM

## 2023-03-10 DIAGNOSIS — G309 Alzheimer's disease, unspecified: Secondary | ICD-10-CM

## 2023-03-10 LAB — POC URINALSYSI DIPSTICK (AUTOMATED)
Bilirubin, UA: NEGATIVE
Blood, UA: NEGATIVE
Glucose, UA: NEGATIVE
Ketones, UA: NEGATIVE
Leukocytes, UA: NEGATIVE
Nitrite, UA: NEGATIVE
Protein, UA: NEGATIVE
Spec Grav, UA: 1.015 (ref 1.010–1.025)
Urobilinogen, UA: 0.2 U/dL
pH, UA: 7.5 (ref 5.0–8.0)

## 2023-03-10 MED ORDER — TAMSULOSIN HCL 0.4 MG PO CAPS: 0.8000 mg | ORAL_CAPSULE | Freq: Every day | ORAL | 3 refills | Status: DC

## 2023-03-10 NOTE — Progress Notes (Signed)
Patient ID: [name] [name], male    DOB: 1950/05/30, 72 y.o.   MRN: [id number]  This visit was conducted in person.  BP 110/70 (BP Location: Left Arm, Patient Position: Sitting, Cuff Size: Large)   Pulse (!) 51   Temp 97.6 F (36.4 C) (Temporal)   Ht 6' (1.829 m)   Wt 153 lb 4 oz (69.5 kg)   SpO2 96%   BMI 20.78 kg/m    CC:  Chief Complaint  Patient presents with   Urinary Frequency   Altered Mental Status    Subjective:   HPI: [name] [name] is a 72 y.o. male patient of Dr. Darrick Huntsman with Alzheimer's dementia, parkinson's presenting on 03/10/2023 for Urinary Frequency and Altered Mental Status   He has noted increase in frequency and urgency urination.  No pain with urinating.  Ongoing for  off and on for few weeks.  No fever, no CVA tenderness.  Some increase in confusion in last several weeks. Has nocturia.Marland Kitchen 4 times a night   BPH on flomax and  supplement.    Has lost weight... seeing GI for work up. Wt Readings from Last 3 Encounters:  03/10/23 153 lb 4 oz (69.5 kg)  02/11/23 149 lb (67.6 kg)  02/11/23 149 lb 8 oz (67.8 kg)     Relevant past medical, surgical, family and social history reviewed and updated as indicated. Interim medical history since our last visit reviewed. Allergies and medications reviewed and updated. Outpatient Medications Prior to Visit  Medication Sig Dispense Refill   Acetylcholine Chloride POWD 60 mg by Does not apply route daily.     carbidopa-levodopa (SINEMET IR) 25-100 MG tablet TAKE 1 TABLET BY MOUTH 3 TIMES DAILY 90 tablet 6   CITRUS BERGAMOT PO Take 1 tablet by mouth daily.     co-enzyme Q-10 30 MG capsule Take 30 mg by mouth 3 (three) times daily.     Docosahexaenoic Acid (DHA) 200 MG CAPS Take 1 tablet by mouth daily.     escitalopram (LEXAPRO) 5 MG tablet Take 1 tablet (5 mg total) by mouth daily. 90 tablet 0   Iron, Ferrous Sulfate, 325 (65 Fe) MG TABS Take 325 mg by mouth daily. 30 tablet 2   ketoconazole (NIZORAL) 2 % shampoo  Apply 1 Application topically 2 (two) times a week.     LUTEIN PO Take 1 tablet by mouth daily.     medium chain triglycerides (MCT OIL) oil Take 15 mLs by mouth 3 (three) times daily.     Misc Natural Products (PROSTATE HEALTH PO) Take by mouth.     Omega-3 Fatty Acids (FISH OIL) 300 MG CAPS Take 1 capsule by mouth daily.     POTASSIUM BICARBONATE PO Take 1 tablet by mouth daily.     RABEprazole (ACIPHEX) 20 MG tablet Take 1 tablet (20 mg total) by mouth daily. 30 min before breakfast or dinner 90 tablet 3   rivastigmine (EXELON) 1.5 MG capsule Take 1 capsule (1.5 mg total) by mouth 2 (two) times daily. 180 capsule 0   Thiamine HCl (B-1) 100 MG TABS Take 1 tablet by mouth daily.     traZODone (DESYREL) 100 MG tablet Take 1 tablet (100 mg total) by mouth at bedtime. 90 tablet 1   Turmeric (QC TUMERIC COMPLEX PO) Take 1 capsule by mouth daily.     vitamin B-12 (CYANOCOBALAMIN) 100 MCG tablet Take 100 mcg by mouth daily.     vitamin E 45 MG (100 UNITS) capsule  Take 100 Units by mouth daily.     ZINC PICOLINATE PO Take 1 capsule by mouth daily.     tamsulosin (FLOMAX) 0.4 MG CAPS capsule TAKE 1 CAPSULE BY MOUTH ONCE DAILY 90 capsule 3   No facility-administered medications prior to visit.     Per HPI unless specifically indicated in ROS section below Review of Systems  Constitutional:  Negative for fatigue and fever.  HENT:  Negative for ear pain.   Eyes:  Negative for pain.  Respiratory:  Negative for cough and shortness of breath.   Cardiovascular:  Negative for chest pain, palpitations and leg swelling.  Gastrointestinal:  Negative for abdominal pain.  Genitourinary:  Positive for frequency and urgency. Negative for dysuria, flank pain, penile discharge, penile pain, penile swelling, scrotal swelling and testicular pain.  Musculoskeletal:  Negative for arthralgias.  Neurological:  Negative for syncope, light-headedness and headaches.  Psychiatric/Behavioral:  Positive for confusion and  sleep disturbance. Negative for dysphoric mood.    Objective:  BP 110/70 (BP Location: Left Arm, Patient Position: Sitting, Cuff Size: Large)   Pulse (!) 51   Temp 97.6 F (36.4 C) (Temporal)   Ht 6' (1.829 m)   Wt 153 lb 4 oz (69.5 kg)   SpO2 96%   BMI 20.78 kg/m   Wt Readings from Last 3 Encounters:  03/10/23 153 lb 4 oz (69.5 kg)  02/11/23 149 lb (67.6 kg)  02/11/23 149 lb 8 oz (67.8 kg)      Physical Exam Constitutional:      Appearance: He is well-developed.  HENT:     Head: Normocephalic.     Right Ear: Hearing normal.     Left Ear: Hearing normal.     Nose: Nose normal.  Neck:     Thyroid: No thyroid mass or thyromegaly.     Vascular: No carotid bruit.     Trachea: Trachea normal.  Cardiovascular:     Rate and Rhythm: Normal rate and regular rhythm.     Pulses: Normal pulses.     Heart sounds: Heart sounds not distant. No murmur heard.    No friction rub. No gallop.     Comments: No peripheral edema Pulmonary:     Effort: Pulmonary effort is normal. No respiratory distress.     Breath sounds: Normal breath sounds.  Skin:    General: Skin is warm and dry.     Findings: No rash.  Neurological:     Mental Status: He is alert. He is confused.     Cranial Nerves: Cranial nerves 2-12 are intact.  Psychiatric:        Speech: Speech normal.        Behavior: Behavior normal.        Thought Content: Thought content normal.       Results for orders placed or performed in visit on 03/10/23  POCT Urinalysis Dipstick (Automated)  Result Value Ref Range   Color, UA Yellow    Clarity, UA Clear    Glucose, UA Negative Negative   Bilirubin, UA Negative    Ketones, UA Negative    Spec Grav, UA 1.015 1.010 - 1.025   Blood, UA Negative    pH, UA 7.5 5.0 - 8.0   Protein, UA Negative Negative   Urobilinogen, UA 0.2 0.2 or 1.0 E.U./dL   Nitrite, UA Negative    Leukocytes, UA Negative Negative    Assessment and Plan  Urinary frequency Assessment & Plan: Acute  worsening of chronic issue Symptoms  most likely secondary to BPH versus overactive bladder.  Urinalysis shows no sign of diabetes or infection. Will avoid anticholinergics given dementia/increased confusion risk.  Will start with increase of Flomax to 0.8 mg daily. If not improving can follow-up with PCP in 2 weeks and consider Myrbetriq for possible overactive bladder component.  Orders: -     POCT Urinalysis Dipstick (Automated)  Benign prostatic hyperplasia with nocturia  Mild Alzheimer's dementia without behavioral disturbance, psychotic disturbance, mood disturbance, or anxiety, unspecified timing of dementia onset (HCC) Assessment & Plan: Chronic with acute slight worsening.  No sign of urinary tract infection or other infectious cause of recent worsening. May be secondary to progressive dementia.  Patient is also not been sleeping well given nocturia, hopefully increasing Flomax will help with nocturia and allow him to get better rest at night.  Follow-up with PCP/Neuro if not improving.   Other orders -     Tamsulosin HCl; Take 2 capsules (0.8 mg total) by mouth daily.  Dispense: 60 capsule; Refill: 3    Return in about 2 weeks (around 03/24/2023).   Kerby Nora, MD

## 2023-03-10 NOTE — Patient Instructions (Signed)
Increase  flomax to 2 tabs daily.

## 2023-03-10 NOTE — Assessment & Plan Note (Signed)
Acute worsening of chronic issue Symptoms most likely secondary to BPH versus overactive bladder.  Urinalysis shows no sign of diabetes or infection. Will avoid anticholinergics given dementia/increased confusion risk.  Will start with increase of Flomax to 0.8 mg daily. If not improving can follow-up with PCP in 2 weeks and consider Myrbetriq for possible overactive bladder component.

## 2023-03-10 NOTE — Assessment & Plan Note (Signed)
Chronic with acute slight worsening.  No sign of urinary tract infection or other infectious cause of recent worsening. May be secondary to progressive dementia.  Patient is also not been sleeping well given nocturia, hopefully increasing Flomax will help with nocturia and allow him to get better rest at night.  Follow-up with PCP/Neuro if not improving.

## 2023-03-16 ENCOUNTER — Other Ambulatory Visit: Payer: Self-pay | Admitting: Neurology

## 2023-03-16 MED ORDER — MEMANTINE HCL 10 MG PO TABS: 10.0000 mg | ORAL_TABLET | Freq: Two times a day (BID) | ORAL | 1 refills | Status: DC

## 2023-03-16 MED ORDER — RIVASTIGMINE TARTRATE 3 MG PO CAPS: 3.0000 mg | ORAL_CAPSULE | Freq: Two times a day (BID) | ORAL | 0 refills | Status: DC

## 2023-03-16 NOTE — Telephone Encounter (Signed)
Returned call and wife stated she would like for pt to try Memantine as discussed in office note. Routing to provider

## 2023-03-16 NOTE — Telephone Encounter (Signed)
Please inform wife that exelon has been increased to 3 mg twice daily and Memantine 10 mg twice daily has been ordered.

## 2023-03-16 NOTE — Telephone Encounter (Signed)
Wife calling to report that pt does not have a UTI.  Wife states current medication is not helping, she states she's like for Dr Teresa Coombs to go ahead and put pt on the new medication he was supposed to start in 3 months because current medication is not helping. Please call wife.

## 2023-03-17 NOTE — Progress Notes (Unsigned)
03/19/2023 8:38 AM   [name] [name] [birth year] [id number]  Referring provider: Sherlene Shams, MD [address] [place],  [place] [address]  Urological history: 1. Elevated PSA  -PSA (01/2023) 6.71 -Prostate MRI (03/2021) - PI-RADS 3   2. BPH with LU TS -prostate volume (03/2021) 50.90 -Tamsulosin 0.4 mg daily  3. Renal cysts -contrast CT (01/2023) - parapelvic left renal cysts   4. Bladder diverticula -contrast CT (01/2023) - right sided bladder diverticula   No chief complaint on file.  HPI: [name] [name] is a 72 y.o. male who presents today for incontinence, frequency and cannot hold urine.  Previous records reviewed.   I PSS ***  PVR ***  UA ***    Score:  1-7 Mild 8-19 Moderate 20-35 Severe   PMH: 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 and some scarring in left eye but not blind    COVID-19    03/2020   Depression    remote h/o suicide ideation    GERD (gastroesophageal reflux disease)    Hypertension    Sleep apnea    Syncope 05/20/2017    Surgical History: Past Surgical History:  Procedure Laterality Date   COLONOSCOPY WITH PROPOFOL N/A 04/01/2018   Procedure: COLONOSCOPY WITH PROPOFOL;  Surgeon: Christena Deem, MD;  Location: [facility] ENDOSCOPY;  Service: Endoscopy;  Laterality: N/A;   COLONOSCOPY WITH PROPOFOL N/A 07/02/2021   Procedure: COLONOSCOPY WITH PROPOFOL;  Surgeon: Regis Bill, MD;  Location: [facility] ENDOSCOPY;  Service: Endoscopy;  Laterality: N/A;   ESOPHAGOGASTRODUODENOSCOPY N/A 04/01/2018   Procedure: ESOPHAGOGASTRODUODENOSCOPY (EGD);  Surgeon: Christena Deem, MD;  Location: [facility] ENDOSCOPY;  Service: Endoscopy;  Laterality: N/A;   ESOPHAGOGASTRODUODENOSCOPY (EGD) WITH PROPOFOL N/A 07/02/2021   Procedure: ESOPHAGOGASTRODUODENOSCOPY (EGD) WITH PROPOFOL;  Surgeon: Regis Bill, MD;  Location: [facility] ENDOSCOPY;  Service: Endoscopy;  Laterality: N/A;    ESOPHAGOGASTRODUODENOSCOPY (EGD) WITH PROPOFOL N/A 10/07/2021   Procedure: ESOPHAGOGASTRODUODENOSCOPY (EGD) WITH PROPOFOL;  Surgeon: Regis Bill, MD;  Location: [facility] ENDOSCOPY;  Service: Endoscopy;  Laterality: N/A;   HEMORROIDECTOMY  1980   VASECTOMY      Home Medications:  Allergies as of 03/19/2023       Reactions   Omeprazole Diarrhea   Statins Other (See Comments)   Fatigue,muscle aches Other Reaction(s): Other (See Comments)        Medication List        Accurate as of March 17, 2023  8:38 AM. If you have any questions, ask your nurse or doctor.          Acetylcholine Chloride Powd 60 mg by Does not apply route daily.   B-1 100 MG Tabs Take 1 tablet by mouth daily.   carbidopa-levodopa 25-100 MG tablet Commonly known as: SINEMET IR TAKE 1 TABLET BY MOUTH 3 TIMES DAILY   CITRUS BERGAMOT PO Take 1 tablet by mouth daily.   co-enzyme Q-10 30 MG capsule Take 30 mg by mouth 3 (three) times daily.   DHA 200 MG Caps Take 1 tablet by mouth daily.   escitalopram 5 MG tablet Commonly known as: LEXAPRO Take 1 tablet (5 mg total) by mouth daily.   Fish Oil 300 MG Caps Take 1 capsule by mouth daily.   Iron (Ferrous Sulfate) 325 (65 Fe) MG Tabs Take 325 mg by mouth daily.   ketoconazole 2 % shampoo Commonly known as: NIZORAL Apply 1 Application topically 2 (two)  times a week.   LUTEIN PO Take 1 tablet by mouth daily.   medium chain triglycerides oil Commonly known as: MCT OIL Take 15 mLs by mouth 3 (three) times daily.   memantine 10 MG tablet Commonly known as: NAMENDA Take 1 tablet (10 mg total) by mouth 2 (two) times daily.   POTASSIUM BICARBONATE PO Take 1 tablet by mouth daily.   PROSTATE HEALTH PO Take by mouth.   QC TUMERIC COMPLEX PO Take 1 capsule by mouth daily.   RABEprazole 20 MG tablet Commonly known as: ACIPHEX Take 1 tablet (20 mg total) by mouth daily. 30 min before breakfast or dinner   rivastigmine 3 MG  capsule Commonly known as: EXELON Take 1 capsule (3 mg total) by mouth 2 (two) times daily.   tamsulosin 0.4 MG Caps capsule Commonly known as: FLOMAX Take 2 capsules (0.8 mg total) by mouth daily.   traZODone 100 MG tablet Commonly known as: DESYREL Take 1 tablet (100 mg total) by mouth at bedtime.   vitamin B-12 100 MCG tablet Commonly known as: CYANOCOBALAMIN Take 100 mcg by mouth daily.   vitamin E 45 MG (100 UNITS) capsule Take 100 Units by mouth daily.   ZINC PICOLINATE PO Take 1 capsule by mouth daily.        Allergies:  Allergies  Allergen Reactions   Omeprazole Diarrhea   Statins Other (See Comments)    Fatigue,muscle aches  Other Reaction(s): Other (See Comments)    Family History: Family History  Problem Relation Age of Onset   Dementia Mother    Stroke Mother        52s   Cancer Father        colon cancer   Stroke Father    Heart attack Father        h/o cig smoker    Prostate cancer Father        ?   Heart disease Father    Other Father        smoker   Cancer Brother        colon cancer dx'ed age 36 as of 06/2021   Esophageal cancer Brother    Heart attack Paternal Grandmother     Social History:  reports that he has never smoked. He has never been exposed to tobacco smoke. He has never used smokeless tobacco. He reports current alcohol use. He reports that he does not use drugs.  ROS: Pertinent ROS in HPI  Physical Exam: There were no vitals taken for this visit.  Constitutional:  Well nourished. Alert and oriented, No acute distress. HEENT: [place] AT, moist mucus membranes.  Trachea midline, no masses. Cardiovascular: No clubbing, cyanosis, or edema. Respiratory: Normal respiratory effort, no increased work of breathing. GI: Abdomen is soft, non tender, non distended, no abdominal masses. Liver and spleen not palpable.  No hernias appreciated.  Stool sample for occult testing is not indicated.   GU: No CVA tenderness.  No bladder  fullness or masses.  Patient with circumcised/uncircumcised phallus. ***Foreskin easily retracted***  Urethral meatus is patent.  No penile discharge. No penile lesions or rashes. Scrotum without lesions, cysts, rashes and/or edema.  Testicles are located scrotally bilaterally. No masses are appreciated in the testicles. Left and right epididymis are normal. Rectal: Patient with  normal sphincter tone. Anus and perineum without scarring or rashes. No rectal masses are appreciated. Prostate is approximately *** grams, *** nodules are appreciated. Seminal vesicles are normal. Skin: No rashes, bruises or suspicious lesions.  Lymph: No cervical or inguinal adenopathy. Neurologic: Grossly intact, no focal deficits, moving all 4 extremities. Psychiatric: Normal mood and affect.  Laboratory Data: Lab Results  Component Value Date   WBC 5.7 02/11/2023   HGB 12.7 (L) 02/11/2023   HCT 36.9 (L) 02/11/2023   MCV 96.6 02/11/2023   PLT 242.0 02/11/2023   Lab Results  Component Value Date   CREATININE 0.93 02/11/2023   Lab Results  Component Value Date   PSA 6.71 (H) 02/11/2023   PSA 4.2 06/16/2017   Lab Results  Component Value Date   HGBA1C 5.2 02/11/2023    Lab Results  Component Value Date   TSH 2.33 02/11/2023      Component Value Date/Time   CHOL 194 10/30/2022 0857   CHOL 138 12/30/2018 0822   HDL 54.80 10/30/2022 0857   HDL 43 12/30/2018 0822   CHOLHDL 4 10/30/2022 0857   VLDL 16.4 10/30/2022 0857   LDLCALC 123 (H) 10/30/2022 0857   LDLCALC 78 12/30/2018 0822   LDLCALC 88 08/24/2018 1017   Lab Results  Component Value Date   AST 18 02/11/2023   Lab Results  Component Value Date   ALT 9 02/11/2023   Urinalysis See EPIC and HPI I have reviewed the labs.   Pertinent Imaging: Narrative & Impression  CLINICAL DATA:  Elevated PSA.  Weight loss.   EXAM: CT CHEST, ABDOMEN, AND PELVIS WITH CONTRAST   TECHNIQUE: Multidetector CT imaging of the chest, abdomen and  pelvis was performed following the standard protocol during bolus administration of intravenous contrast.   RADIATION DOSE REDUCTION: This exam was performed according to the departmental dose-optimization program which includes automated exposure control, adjustment of the mA and/or kV according to patient size and/or use of iterative reconstruction technique.   CONTRAST:  85mL OMNIPAQUE IOHEXOL 300 MG/ML  SOLN   COMPARISON:  Prostate MRI 04/28/2021   FINDINGS: CT CHEST FINDINGS   Cardiovascular: Coronary artery calcifications are seen. Heart is nonenlarged. Small pericardial effusion inferiorly. The thoracic aorta has a normal course and caliber.   Mediastinum/Nodes: Small hiatal hernia. Slightly patulous thoracic esophagus. Small thyroid gland. No specific abnormal lymph node enlargement identified in the axillary regions, hilum or mediastinum.   Lungs/Pleura: Few scattered noncalcified nodules identified. Largest is seen 5 mm left lateral costophrenic angle juxtapleural on series 3, image 163. No other examples of 4 mm middle lobe nodule series 3, image 126. Areas of scattered scarring and fibrotic changes of the lungs. No consolidation, pneumothorax or effusion.   Musculoskeletal: Mild curvature and degenerative changes along the spine. Gynecomastia   CT ABDOMEN PELVIS FINDINGS   Hepatobiliary: Fatty liver infiltration. No space-occupying liver lesion. Gallbladder is contracted. Patent portal vein.   Pancreas: Unremarkable. No pancreatic ductal dilatation or surrounding inflammatory changes.   Spleen: Normal in size without focal abnormality.   Adrenals/Urinary Tract: Adrenal glands are preserved. No enhancing renal mass. Parapelvic left-sided renal cysts. No collecting system dilatation. Ureters have normal course and caliber extending down to the bladder. Posterolateral right-sided bladder diverticula identified.   Stomach/Bowel: There is fluid and debris in  the stomach. Small bowel and large bowel are nondilated. Scattered diffuse colonic stool. Normal appendix.   Vascular/Lymphatic: Aortic atherosclerosis. No enlarged abdominal or pelvic lymph nodes.   Reproductive: Enlarged prostate with mass effect along the base of the bladder. Prostate is heterogeneous. Please correlate with patient's PSA and clinical history.   Other: Anasarca.  No free air or free fluid.   Musculoskeletal: Moderate degenerative changes  along the spine.   IMPRESSION: Enlarged heterogeneous prostate with mass effect along the bladder. Please correlate with patient's history and PSA. Small right-sided bladder diverticulum.   Parapelvic left-sided renal cysts.   Fatty liver infiltration.   No bowel obstruction, free air or free fluid.   Few tiny lung nodules identified measuring up to 5 mm. Please correlate with any prior. No follow-up needed if patient is low-risk (and has no known or suspected primary neoplasm). Non-contrast chest CT can be considered in 12 months if patient is high-risk. This recommendation follows the consensus statement: Guidelines for Management of Incidental Pulmonary Nodules Detected on CT Images: From the Fleischner Society 2017; Radiology 2017; 284:228-243.     Electronically Signed   By: Karen Kays M.D.   On: 02/19/2023 17:00  I have independently reviewed the films.    Assessment & Plan:    1. BPH with LUTS -PSA elevated -DRE benign *** -UA benign *** -PVR < 300 cc *** -symptoms - *** -most bothersome symptoms are frequency, urgency and incontinence -continue conservative management, avoiding bladder irritants and timed voiding's -Initiate 5 alpha reductase inhibitor (***), discussed side effects *** -Continue tamsulosin 0.4 mg daily -Explained that his parkinsonism may be contributing to some of his bladder issues as well -We could consider a cystoscopy to evaluate the internal anatomy of the bladder to see if there  is any worrisome lesions or evidence of bladder outlet obstruction -Cannot tolerate medication or medication failure, schedule cystoscopy ***  2. Elevated PSA -discussed with patient that with a newly elevated PSA, 25% to 40% of the time it will return to baseline levels and for that reason it is advised to repeat the levels in 8 weeks (per 2023 AUA guidelines) *** -threshold values of 2.5 for people in their 40's, 3.5 for people in their 50's, 4.5 for people in their 60's, 6.5 for people in their 70's *** -We reviewed the implications of an elevated PSA and the uncertainty surrounding it. In general, a man's PSA increases with age and is produced by both normal and cancerous prostate tissue. -The differential diagnosis for elevated PSA includes BPH, prostate cancer, infection, recent intercourse/ejaculation, recent urethroscopic manipulation (foley placement/cystoscopy) or trauma, and prostatitis *** -Management of an elevated PSA can include observation or prostate biopsy and we discussed this in detail. Our goal is to detect clinically significant prostate cancers, and manage with either active surveillance, surgery, or radiation for localized disease. Risks of prostate biopsy include bleeding, infection (including life threatening sepsis), pain, and lower urinary symptoms. Hematuria, hematospermia, and blood in the stool are all common after biopsy and can persist up to 4 weeks ***  -Will pursue further stratification risks with prostate MRI   No follow-ups on file.  These notes generated with voice recognition software. I apologize for typographical errors.  Cloretta Ned  [facility] [address]  [address] [place], [place] [address] [phone]

## 2023-03-19 ENCOUNTER — Ambulatory Visit: Payer: PPO | Admitting: Urology

## 2023-03-19 ENCOUNTER — Encounter: Payer: Self-pay | Admitting: Urology

## 2023-03-19 VITALS — BP 173/70 | HR 60

## 2023-03-19 DIAGNOSIS — N3941 Urge incontinence: Secondary | ICD-10-CM | POA: Diagnosis not present

## 2023-03-19 DIAGNOSIS — N401 Enlarged prostate with lower urinary tract symptoms: Secondary | ICD-10-CM

## 2023-03-19 DIAGNOSIS — R972 Elevated prostate specific antigen [PSA]: Secondary | ICD-10-CM | POA: Diagnosis not present

## 2023-03-19 DIAGNOSIS — R351 Nocturia: Secondary | ICD-10-CM | POA: Diagnosis not present

## 2023-03-19 LAB — MICROSCOPIC EXAMINATION: Bacteria, UA: NONE SEEN

## 2023-03-19 LAB — URINALYSIS, COMPLETE
Bilirubin, UA: NEGATIVE
Glucose, UA: NEGATIVE
Ketones, UA: NEGATIVE
Leukocytes,UA: NEGATIVE
Nitrite, UA: NEGATIVE
Protein,UA: NEGATIVE
RBC, UA: NEGATIVE
Specific Gravity, UA: 1.02 (ref 1.005–1.030)
Urobilinogen, Ur: 0.2 mg/dL (ref 0.2–1.0)
pH, UA: 6.5 (ref 5.0–7.5)

## 2023-03-19 LAB — BLADDER SCAN AMB NON-IMAGING: Scan Result: 73

## 2023-03-19 NOTE — Addendum Note (Signed)
Addended by: Michiel Cowboy A on: 03/19/2023 02:33 PM   Modules accepted: Orders

## 2023-03-23 ENCOUNTER — Other Ambulatory Visit: Payer: Self-pay | Admitting: Urology

## 2023-03-23 DIAGNOSIS — B9620 Unspecified Escherichia coli [E. coli] as the cause of diseases classified elsewhere: Secondary | ICD-10-CM

## 2023-03-23 LAB — CULTURE, URINE COMPREHENSIVE

## 2023-03-23 MED ORDER — CEFUROXIME AXETIL 250 MG PO TABS: 250.0000 mg | ORAL_TABLET | Freq: Two times a day (BID) | ORAL | 0 refills | Status: AC

## 2023-03-30 ENCOUNTER — Encounter: Payer: Self-pay | Admitting: Neurology

## 2023-04-07 NOTE — Progress Notes (Signed)
 04/08/2023 2:01 PM   [name] [name] [birth year] [id number]  Referring provider: Marylynn Verneita CROME, MD [address] [place],  [place] [address]  Urological history: 1. Elevated PSA  -PSA (01/2023) 6.71 -Prostate MRI (03/2021) - PI-RADS 3   2. BPH with LU TS -prostate volume (03/2021) 50.90 -Tamsulosin  0.4 mg daily  3. Renal cysts -contrast CT (01/2023) - parapelvic left renal cysts   4. Bladder diverticula -contrast CT (01/2023) - right sided bladder diverticula   Chief Complaint  Patient presents with   Follow-up   Urinary Frequency   HPI: [name] [name] is a 73 y.o. male who presents today for incontinence, frequency and cannot hold urine with his wife, [name] [name].   Previous records reviewed.   At his visit on 03/19/2023, he was seen by his PCPs office approximately 2 weeks ago for acute worsening of frequency and urgency.  They did a urine dip which noted no signs of diabetes or infection.  His tamsulosin  was increased to 0.8 mg daily.  For the last 2 months he has had worsening of his urinary symptoms.  He is having to pull off to the side of the road to empty his bladder at times.  He has had urge incontinent episodes while shopping because he could not find the bathroom in the facility.  They denied any change of diet or medications.  Patient denies any modifying or aggravating factors.  Patient denies any recent UTI's, gross hematuria, dysuria or suprapubic/flank pain.  Patient denies any fevers, chills, nausea or vomiting.   I PSS 23/4.  PVR 73 mL.  UA benign.   Rescheduled for cystoscopy for further evaluation of his urinary symptoms.  We also discussed treatment options for urge incontinence and he was interested in PTNS therapy pending cystoscopy results.  His urine culture returned positive for E. coli and he is placed on 7 days of culture appropriate antibiotics.  I PSS 23/6  PVR 24 mL  UA benign   He is wife stated his symptoms did not improve at all  while on the antibiotic.  He is mostly bothered at night getting up almost every hour to urinate.  He is also having issues with the need to have bowel movements as well.  He does have a an appointment with gastroenterology next week to discuss this.  Patient denies any modifying or aggravating factors.  Patient denies any recent UTI's, gross hematuria, dysuria or suprapubic/flank pain.  Patient denies any fevers, chills, nausea or vomiting.     IPSS     Row Name 04/08/23 1300         International Prostate Symptom Score   How often have you had the sensation of not emptying your bladder? More than half the time     How often have you had to urinate less than every two hours? More than half the time     How often have you found you stopped and started again several times when you urinated? About half the time     How often have you found it difficult to postpone urination? About half the time     How often have you had a weak urinary stream? About half the time     How often have you had to strain to start urination? About half the time     How many times did you typically get up at night to urinate? 3 Times     Total IPSS Score 23  Quality of Life due to urinary symptoms   If you were to spend the rest of your life with your urinary condition just the way it is now how would you feel about that? Terrible               Score:  1-7 Mild 8-19 Moderate 20-35 Severe   PMH: 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 and some scarring in left eye but not blind    COVID-19    03/2020   Depression    remote h/o suicide ideation    GERD (gastroesophageal reflux disease)    Hypertension    Sleep apnea    Syncope 05/20/2017    Surgical History: Past Surgical History:  Procedure Laterality Date   COLONOSCOPY WITH PROPOFOL  N/A 04/01/2018   Procedure: COLONOSCOPY WITH PROPOFOL ;  Surgeon: Gaylyn Gladis PENNER, MD;  Location: [facility]  ENDOSCOPY;  Service: Endoscopy;  Laterality: N/A;   COLONOSCOPY WITH PROPOFOL  N/A 07/02/2021   Procedure: COLONOSCOPY WITH PROPOFOL ;  Surgeon: Maryruth Ole DASEN, MD;  Location: [facility] ENDOSCOPY;  Service: Endoscopy;  Laterality: N/A;   ESOPHAGOGASTRODUODENOSCOPY N/A 04/01/2018   Procedure: ESOPHAGOGASTRODUODENOSCOPY (EGD);  Surgeon: Gaylyn Gladis PENNER, MD;  Location: [facility] ENDOSCOPY;  Service: Endoscopy;  Laterality: N/A;   ESOPHAGOGASTRODUODENOSCOPY (EGD) WITH PROPOFOL  N/A 07/02/2021   Procedure: ESOPHAGOGASTRODUODENOSCOPY (EGD) WITH PROPOFOL ;  Surgeon: Maryruth Ole DASEN, MD;  Location: [facility] ENDOSCOPY;  Service: Endoscopy;  Laterality: N/A;   ESOPHAGOGASTRODUODENOSCOPY (EGD) WITH PROPOFOL  N/A 10/07/2021   Procedure: ESOPHAGOGASTRODUODENOSCOPY (EGD) WITH PROPOFOL ;  Surgeon: Maryruth Ole DASEN, MD;  Location: [facility] ENDOSCOPY;  Service: Endoscopy;  Laterality: N/A;   HEMORROIDECTOMY  1980   VASECTOMY      Home Medications:  Allergies as of 04/08/2023       Reactions   Omeprazole Diarrhea   Statins Other (See Comments)   Fatigue,muscle aches Other Reaction(s): Other (See Comments)        Medication List        Accurate as of April 08, 2023  2:01 PM. If you have any questions, ask your nurse or doctor.          Acetylcholine Chloride Powd 60 mg by Does not apply route daily.   B-1 100 MG Tabs Take 1 tablet by mouth daily.   carbidopa -levodopa  25-100 MG tablet Commonly known as: SINEMET  IR TAKE 1 TABLET BY MOUTH 3 TIMES DAILY   CITRUS BERGAMOT PO Take 1 tablet by mouth daily.   co-enzyme Q-10 30 MG capsule Take 30 mg by mouth 3 (three) times daily.   DHA 200 MG Caps Take 1 tablet by mouth daily.   escitalopram  5 MG tablet Commonly known as: LEXAPRO  Take 1 tablet (5 mg total) by mouth daily.   Fish Oil 300 MG Caps Take 1 capsule by mouth daily.   Iron  (Ferrous Sulfate ) 325 (65 Fe) MG Tabs Take 325 mg by mouth daily.   ketoconazole 2 % shampoo Commonly known as:  NIZORAL Apply 1 Application topically 2 (two) times a week.   LUTEIN PO Take 1 tablet by mouth daily.   medium chain triglycerides oil Commonly known as: MCT OIL Take 15 mLs by mouth 3 (three) times daily.   memantine  10 MG tablet Commonly known as: NAMENDA  Take 1 tablet (10 mg total) by mouth 2 (two) times daily.   POTASSIUM BICARBONATE PO Take 1 tablet by mouth daily.   PROSTATE HEALTH PO Take by mouth.   QC TUMERIC  COMPLEX PO Take 1 capsule by mouth daily.   RABEprazole  20 MG tablet Commonly known as: ACIPHEX  Take 1 tablet (20 mg total) by mouth daily. 30 min before breakfast or dinner   rivastigmine  3 MG capsule Commonly known as: EXELON  Take 1 capsule (3 mg total) by mouth 2 (two) times daily.   tamsulosin  0.4 MG Caps capsule Commonly known as: FLOMAX  Take 2 capsules (0.8 mg total) by mouth daily.   traZODone  100 MG tablet Commonly known as: DESYREL  Take 1 tablet (100 mg total) by mouth at bedtime.   vitamin B-12 100 MCG tablet Commonly known as: CYANOCOBALAMIN  Take 100 mcg by mouth daily.   vitamin E 45 MG (100 UNITS) capsule Take 100 Units by mouth daily.   ZINC PICOLINATE PO Take 1 capsule by mouth daily.        Allergies:  Allergies  Allergen Reactions   Omeprazole Diarrhea   Statins Other (See Comments)    Fatigue,muscle aches  Other Reaction(s): Other (See Comments)    Family History: Family History  Problem Relation Age of Onset   Dementia Mother    Stroke Mother        15s   Cancer Father        colon cancer   Stroke Father    Heart attack Father        h/o cig smoker    Prostate cancer Father        ?   Heart disease Father    Other Father        smoker   Cancer Brother        colon cancer dx'ed age 25 as of 06/2021   Esophageal cancer Brother    Heart attack Paternal Grandmother     Social History:  reports that he has never smoked. He has never been exposed to tobacco smoke. He has never used smokeless tobacco. He  reports current alcohol use. He reports that he does not use drugs.  ROS: Pertinent ROS in HPI  Physical Exam: BP 106/73   Pulse 66   Ht 6' (1.829 m)   Wt 155 lb (70.3 kg)   BMI 21.02 kg/m   Constitutional:  Well nourished. Alert and oriented, No acute distress. HEENT: [place] AT, moist mucus membranes.  Trachea midline Cardiovascular: No clubbing, cyanosis, or edema. Respiratory: Normal respiratory effort, no increased work of breathing. Neurologic: Grossly intact, no focal deficits, moving all 4 extremities. Psychiatric: Normal mood and affect.   Laboratory Data: Urinalysis See EPIC and HPI I have reviewed the labs.   Pertinent Imaging:  04/08/23 13:07  Scan Result 24ml       Assessment & Plan:    1. BPH with LUTS -PVR < 300 cc  -most bothersome symptoms are urge incontinence -continue conservative management, avoiding bladder irritants and timed voiding's -Continue tamsulosin  0.4 mg twice daily -cysto pending -I had a frank discussion with the patient and his wife explaining that his urinary issues are likely multifactorial and we need to be realistic about our goals going forward.  I explained that we likely will not get him to a point where he is able to sleep completely during the night without interruptions with the urge to urinate due to his sleep apnea, his Parkinson's disease, BPH and Alzheimer's.  My hope is we may be to reduce his symptoms by about 50% with PTNS if his cystoscopy does not reveal a cause for his bladder symptoms. -He and his wife understand this and will like  to proceed with PTNS if cystoscopy is normal  2. Urge incontinence -would consider PTNS is cysto does not find an etiology for the urge incontinence  3.  E.coli UTI -UA benign  -urine culture pending -explained that this may be a red herring as he may be colonized and this is not contributing to his symptoms especially since there is no improvement in symptoms with his initial course of  antibiotics   Return for cysto for urge incontinence and nocturia with Dr. Penne .  These notes generated with voice recognition software. I apologize for typographical errors.  CLOTILDA HELON RIGGERS  [facility] [address]  [address] [place], [place] [address] [phone]

## 2023-04-08 ENCOUNTER — Encounter: Payer: Self-pay | Admitting: Urology

## 2023-04-08 ENCOUNTER — Ambulatory Visit: Payer: PPO | Admitting: Urology

## 2023-04-08 VITALS — BP 106/73 | HR 66 | Ht 72.0 in | Wt 155.0 lb

## 2023-04-08 DIAGNOSIS — R82998 Other abnormal findings in urine: Secondary | ICD-10-CM

## 2023-04-08 DIAGNOSIS — N390 Urinary tract infection, site not specified: Secondary | ICD-10-CM | POA: Diagnosis not present

## 2023-04-08 DIAGNOSIS — B9620 Unspecified Escherichia coli [E. coli] as the cause of diseases classified elsewhere: Secondary | ICD-10-CM

## 2023-04-08 DIAGNOSIS — N3941 Urge incontinence: Secondary | ICD-10-CM

## 2023-04-08 DIAGNOSIS — N401 Enlarged prostate with lower urinary tract symptoms: Secondary | ICD-10-CM | POA: Diagnosis not present

## 2023-04-08 LAB — BLADDER SCAN AMB NON-IMAGING

## 2023-04-09 ENCOUNTER — Telehealth: Payer: Self-pay

## 2023-04-09 ENCOUNTER — Ambulatory Visit: Payer: PPO | Admitting: Neurology

## 2023-04-09 LAB — URINALYSIS, COMPLETE
Bilirubin, UA: NEGATIVE
Glucose, UA: NEGATIVE
Leukocytes,UA: NEGATIVE
Nitrite, UA: NEGATIVE
Protein,UA: NEGATIVE
RBC, UA: NEGATIVE
Specific Gravity, UA: 1.02 (ref 1.005–1.030)
Urobilinogen, Ur: 0.2 mg/dL (ref 0.2–1.0)
pH, UA: 6.5 (ref 5.0–7.5)

## 2023-04-09 LAB — MICROSCOPIC EXAMINATION: Bacteria, UA: NONE SEEN

## 2023-04-09 NOTE — Telephone Encounter (Signed)
-----   Message from [facility] sent at 04/08/2023  2:55 PM EST ----- Regarding: PTNS Will you see if his insurance will cover PTNS?  He will probably start it if his cysto is negative.

## 2023-04-11 ENCOUNTER — Other Ambulatory Visit: Payer: Self-pay | Admitting: Urology

## 2023-04-11 DIAGNOSIS — N3941 Urge incontinence: Secondary | ICD-10-CM

## 2023-04-11 LAB — CULTURE, URINE COMPREHENSIVE

## 2023-04-11 MED ORDER — CIPROFLOXACIN HCL 250 MG PO TABS
250.0000 mg | ORAL_TABLET | Freq: Two times a day (BID) | ORAL | 0 refills | Status: DC
Start: 2023-04-11 — End: 2023-04-27

## 2023-04-15 ENCOUNTER — Institutional Professional Consult (permissible substitution): Payer: PPO | Admitting: Student in an Organized Health Care Education/Training Program

## 2023-04-15 ENCOUNTER — Ambulatory Visit: Payer: PPO | Admitting: Neurology

## 2023-04-15 ENCOUNTER — Encounter: Payer: Self-pay | Admitting: Neurology

## 2023-04-15 VITALS — BP 136/86 | HR 68 | Ht 71.0 in | Wt 149.8 lb

## 2023-04-15 DIAGNOSIS — G301 Alzheimer's disease with late onset: Secondary | ICD-10-CM

## 2023-04-15 DIAGNOSIS — G20C Parkinsonism, unspecified: Secondary | ICD-10-CM

## 2023-04-15 DIAGNOSIS — I951 Orthostatic hypotension: Secondary | ICD-10-CM

## 2023-04-15 DIAGNOSIS — F02B3 Dementia in other diseases classified elsewhere, moderate, with mood disturbance: Secondary | ICD-10-CM

## 2023-04-15 NOTE — Progress Notes (Signed)
 [facility]  PATIENT: [name] [name] DOB: 09/17/1950  REQUESTING CLINICIAN: Thersia Flax, MD HISTORY FROM: Patient and spouse  REASON FOR VISIT: Memory decline    HISTORICAL  CHIEF COMPLAINT:  Chief Complaint  Patient presents with   Follow-up    Pt in 12, here alone  Pt is here for follow up on Alzheimers dementia. Pt states his memory can be stable one day and another day is not, states he feels pretty sharp one day and not so much on others. Pt's wife states pt might have had a seizure on Saturday 04/11/23- states he was stiff rigid, not responding he was in a frozen stare. States it happened twice.     INTERVAL HISTORY 04/15/2023:  Patient presents today with spouse to report worsening of his dementia. At last visit in November, we have started him on Exelon  and currently, he is taking 3 mg twice daily. Wife reports that he has:  Difficulty with dressing  Difficultly with sequencing, some executive function deficits Difficulty with cleaning, not sure when to wash or dry dishes  Getting confused  Asked wife if she has children (see them all the time)  Trouble with simple puzzles  Leaving the water  out  Cutting the screen porch, could not understand why it was a bad idea, was fixated on cutting the screen porch  Difficulty being around large crowd, has severe agitation during christmas dinner, shouting at wife and children.   Has an episode concerning for seizures. Wife reports that patient walk in to the house, and become stiff, eyes staring, no abnormal movement but he could not respond or follow commands. She helped him to the ground, he was hypotensive to the 80s systolic, at one point was 50. She gave him orange juice, try to stand him up and it occurred again. Again low pressure and she has to keep laying for several hours before he was able to stand again.  While laying down, he was coherent and responsive. They denies abnormal movement, no tongue  biting and no urinary incontinence.     INTERVAL HISTORY 02/11/2023 Patient presents today for follow-up, he is accompanied by wife.  Last visit was in January, at that time we obtained the ATN profile for his cognitive impairment and it was positive for Alzheimer disease biomarker.  We opted not to start him on medication since he was bradycardic.  During our visit in January, he also exhibited signs concerning for Parkinson disease.  DaTscan  was obtained and was positive.  Patient started on Sinemet , since starting the Sinemet , wife tells me that his movement is better, his walking and gait is better. In terms of the memory, they report it is worsening, he has difficulty with short-term memory.  He is having issues balancing his checkbook. Today, he did have difficulty setting up the thermostat. He is also complaining of difficulty writing and spelling, wife tells me that he could not figure out how to mail a check, where to put the port staging and follow-up.  HISTORY OF PRESENT ILLNESS:  This is a 73 year old gentleman past medical history hypertension, hyperlipidemia, insomnia on trazodone  who is presenting with memory problem.  Patient reports memory problem has been going on for the past 2 years.  He does have issues with short-term memory, word finding difficulty and that really upset him.  Sometimes he is very frustrated.  He lives with his wife, he is able to perform activities of daily living.  Wife reports that he  does not repeat himself and he does not need constant reminders.  He still drives, denies being lost in familiar places and denies any recent accident.  He still handles his bills.  He did does not report any trouble with family members names.   TBI: Yes, in his 73s.  Stroke:    no past history of stroke Seizures:   no past history of seizures Sleep:  Diagnosed with sleep apnea but could not tolerate CPAP.   Mood: patient has  anxiety and depression Family history of Dementia:  Mother with dementia in her 79  Functional status: independent in all ADLs and IADLs Patient lives with Spouse . Cooking: No  Cleaning: yes Shopping: Yes, with a list  Bathing: yes, no help needed Toileting: yes, no help needed Driving: No Bills: Patient   Ever left the stove on by accident?: N/A Forget how to use items around the house?: yes Getting lost going to familiar places?: yes Forgetting loved ones names?: yes  Word finding difficulty? Yes  Sleep: Good with Trazodone     OTHER MEDICAL CONDITIONS: Hypertension, hyperlipidemia, Insomnia, Parkinsonism   REVIEW OF SYSTEMS: Full 14 system review of systems performed and negative with exception of: As noted in the HPI   ALLERGIES: Allergies  Allergen Reactions   Omeprazole Diarrhea   Statins Other (See Comments)    Fatigue,muscle aches  Other Reaction(s): Other (See Comments)    HOME MEDICATIONS: Outpatient Medications Prior to Visit  Medication Sig Dispense Refill   Acetylcholine Chloride POWD 60 mg by Does not apply route daily.     ciprofloxacin  (CIPRO ) 250 MG tablet Take 1 tablet (250 mg total) by mouth 2 (two) times daily. 14 tablet 0   CITRUS BERGAMOT PO Take 1 tablet by mouth daily.     co-enzyme Q-10 30 MG capsule Take 30 mg by mouth 3 (three) times daily.     Docosahexaenoic Acid (DHA) 200 MG CAPS Take 1 tablet by mouth daily.     escitalopram  (LEXAPRO ) 5 MG tablet Take 1 tablet (5 mg total) by mouth daily. 90 tablet 0   Iron , Ferrous Sulfate , 325 (65 Fe) MG TABS Take 325 mg by mouth daily. 30 tablet 2   ketoconazole (NIZORAL) 2 % shampoo Apply 1 Application topically 2 (two) times a week.     LUTEIN PO Take 1 tablet by mouth daily.     medium chain triglycerides (MCT OIL) oil Take 15 mLs by mouth 3 (three) times daily.     memantine  (NAMENDA ) 10 MG tablet Take 1 tablet (10 mg total) by mouth 2 (two) times daily. 180 tablet 1   Misc Natural Products (PROSTATE HEALTH PO) Take by mouth.     Omega-3 Fatty  Acids (FISH OIL) 300 MG CAPS Take 1 capsule by mouth daily.     POTASSIUM BICARBONATE PO Take 1 tablet by mouth daily.     RABEprazole  (ACIPHEX ) 20 MG tablet Take 1 tablet (20 mg total) by mouth daily. 30 min before breakfast or dinner 90 tablet 3   rivastigmine  (EXELON ) 3 MG capsule Take 1 capsule (3 mg total) by mouth 2 (two) times daily. 180 capsule 0   tamsulosin  (FLOMAX ) 0.4 MG CAPS capsule Take 2 capsules (0.8 mg total) by mouth daily. 60 capsule 3   Thiamine HCl (B-1) 100 MG TABS Take 1 tablet by mouth daily.     traZODone  (DESYREL ) 100 MG tablet Take 1 tablet (100 mg total) by mouth at bedtime. 90 tablet 1   Turmeric (QC  TUMERIC COMPLEX PO) Take 1 capsule by mouth daily.     vitamin B-12 (CYANOCOBALAMIN ) 100 MCG tablet Take 100 mcg by mouth daily.     vitamin E 45 MG (100 UNITS) capsule Take 100 Units by mouth daily.     ZINC PICOLINATE PO Take 1 capsule by mouth daily.     carbidopa -levodopa  (SINEMET  IR) 25-100 MG tablet TAKE 1 TABLET BY MOUTH 3 TIMES DAILY 90 tablet 6   No facility-administered medications prior to visit.    PAST MEDICAL HISTORY: 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 and some scarring in left eye but not blind    COVID-19    03/2020   Depression    remote h/o suicide ideation    GERD (gastroesophageal reflux disease)    Hypertension    Sleep apnea    Syncope 05/20/2017    PAST SURGICAL HISTORY: Past Surgical History:  Procedure Laterality Date   COLONOSCOPY WITH PROPOFOL  N/A 04/01/2018   Procedure: COLONOSCOPY WITH PROPOFOL ;  Surgeon: Deveron Fly, MD;  Location: [facility] ENDOSCOPY;  Service: Endoscopy;  Laterality: N/A;   COLONOSCOPY WITH PROPOFOL  N/A 07/02/2021   Procedure: COLONOSCOPY WITH PROPOFOL ;  Surgeon: Shane Darling, MD;  Location: [facility] ENDOSCOPY;  Service: Endoscopy;  Laterality: N/A;   ESOPHAGOGASTRODUODENOSCOPY N/A 04/01/2018   Procedure: ESOPHAGOGASTRODUODENOSCOPY (EGD);  Surgeon:  Deveron Fly, MD;  Location: [facility] ENDOSCOPY;  Service: Endoscopy;  Laterality: N/A;   ESOPHAGOGASTRODUODENOSCOPY (EGD) WITH PROPOFOL  N/A 07/02/2021   Procedure: ESOPHAGOGASTRODUODENOSCOPY (EGD) WITH PROPOFOL ;  Surgeon: Shane Darling, MD;  Location: [facility] ENDOSCOPY;  Service: Endoscopy;  Laterality: N/A;   ESOPHAGOGASTRODUODENOSCOPY (EGD) WITH PROPOFOL  N/A 10/07/2021   Procedure: ESOPHAGOGASTRODUODENOSCOPY (EGD) WITH PROPOFOL ;  Surgeon: Shane Darling, MD;  Location: [facility] ENDOSCOPY;  Service: Endoscopy;  Laterality: N/A;   HEMORROIDECTOMY  1980   VASECTOMY      FAMILY HISTORY: Family History  Problem Relation Age of Onset   Dementia Mother    Stroke Mother        65s   Cancer Father        colon cancer   Stroke Father    Heart attack Father        h/o cig smoker    Prostate cancer Father        ?   Heart disease Father    Other Father        smoker   Cancer Brother        colon cancer dx'ed age 30 as of 06/2021   Esophageal cancer Brother    Heart attack Paternal Grandmother     SOCIAL HISTORY: Social History   Socioeconomic History   Marital status: Married    Spouse name: Geologist, engineering   Number of children: 2   Years of education: 12   Highest education level: Associate degree: academic program  Occupational History   Occupation: retired  Tobacco Use   Smoking status: Never    Passive exposure: Never   Smokeless tobacco: Never  Vaping Use   Vaping status: Never Used  Substance and Sexual Activity   Alcohol use: Yes    Alcohol/week: 0.0 standard drinks of alcohol   Drug use: No   Sexual activity: Yes    Partners: Female  Other Topics Concern   Not on file  Social History Narrative   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    Retired Used to work [employer] [place] [employer]  Runner, broadcasting/film/video  Never smoker    Wears eye glasses   Social Drivers of Corporate investment banker Strain: Low Risk  (06/25/2021)   Overall Financial Resource Strain (CARDIA)    Difficulty of Paying  Living Expenses: Not hard at all  Food Insecurity: No Food Insecurity (06/25/2021)   Hunger Vital Sign    Worried About Running Out of Food in the Last Year: Never true    Ran Out of Food in the Last Year: Never true  Transportation Needs: No Transportation Needs (06/25/2021)   PRAPARE - Administrator, Civil Service (Medical): No    Lack of Transportation (Non-Medical): No  Physical Activity: Sufficiently Active (06/25/2021)   Exercise Vital Sign    Days of Exercise per Week: 5 days    Minutes of Exercise per Session: 60 min  Stress: No Stress Concern Present (06/25/2021)   [employer] of Occupational Health - Occupational Stress Questionnaire    Feeling of Stress : Not at all  Social Connections: Socially Integrated (06/25/2021)   Social Connection and Isolation Panel [NHANES]    Frequency of Communication with Friends and Family: Three times a week    Frequency of Social Gatherings with Friends and Family: Three times a week    Attends Religious Services: More than 4 times per year    Active Member of Clubs or Organizations: Yes    Attends Banker Meetings: More than 4 times per year    Marital Status: Married  Catering manager Violence: Not At Risk (06/25/2021)   Humiliation, Afraid, Rape, and Kick questionnaire    Fear of Current or Ex-Partner: No    Emotionally Abused: No    Physically Abused: No    Sexually Abused: No    PHYSICAL EXAM  GENERAL EXAM/CONSTITUTIONAL: Vitals:  Vitals:   04/15/23 1012  BP: 136/86  Pulse: 68  Weight: 149 lb 12.8 oz (67.9 kg)  Height: 5\' 11"  (1.803 m)   Body mass index is 20.89 kg/m. Wt Readings from Last 3 Encounters:  04/15/23 149 lb 12.8 oz (67.9 kg)  04/08/23 155 lb (70.3 kg)  03/10/23 153 lb 4 oz (69.5 kg)   Patient is in no distress; well developed, nourished and groomed; neck is supple. He does have masked facies   MUSCULOSKELETAL: Gait, strength, tone, movements noted in Neurologic exam  below  NEUROLOGIC: MENTAL STATUS:      No data to display            04/10/2022    1:24 PM  Montreal Cognitive Assessment   Visuospatial/ Executive (0/5) 2  Naming (0/3) 2  Attention: Read list of digits (0/2) 1  Attention: Read list of letters (0/1) 0  Attention: Serial 7 subtraction starting at 100 (0/3) 1  Language: Repeat phrase (0/2) 2  Language : Fluency (0/1) 1  Abstraction (0/2) 2  Delayed Recall (0/5) 0  Orientation (0/6) 4  Total 15    CRANIAL NERVE:  2nd, 3rd, 4th, 6th- visual fields full to confrontation, extraocular muscles intact, no nystagmus 5th - facial sensation symmetric 7th - facial strength symmetric. Masked facies  8th - hearing intact 9th - palate elevates symmetrically, uvula midline 11th - shoulder shrug symmetric 12th - tongue protrusion midline  MOTOR:  normal bulk and tone, full strength in the BUE, BLE. No rigidity but there is very mild bradykinesia   SENSORY:  normal and symmetric to light touch  COORDINATION:  finger-nose-finger, fine finger movements normal. Dysdiadochokinesia present   GAIT/STATION:  Normal  but he has decrease arm swing. 3 steps with the pull test.    DIAGNOSTIC DATA (LABS, IMAGING, TESTING) - I reviewed patient records, labs, notes, testing and imaging myself where available.  Lab Results  Component Value Date   WBC 5.7 02/11/2023   HGB 12.7 (L) 02/11/2023   HCT 36.9 (L) 02/11/2023   MCV 96.6 02/11/2023   PLT 242.0 02/11/2023      Component Value Date/Time   NA 139 02/11/2023 1533   NA 139 12/30/2018 0822   K 4.5 02/11/2023 1533   CL 102 02/11/2023 1533   CO2 31 02/11/2023 1533   GLUCOSE 90 02/11/2023 1533   BUN 20 02/11/2023 1533   BUN 18 12/30/2018 0822   CREATININE 0.93 02/11/2023 1533   CREATININE 0.94 08/24/2018 1017   CALCIUM  9.3 02/11/2023 1533   PROT 6.5 02/11/2023 1533   PROT 6.7 12/30/2018 0822   ALBUMIN 4.2 02/11/2023 1533   ALBUMIN 4.4 12/30/2018 0822   AST 18 02/11/2023 1533    ALT 9 02/11/2023 1533   ALKPHOS 60 02/11/2023 1533   BILITOT 0.6 02/11/2023 1533   BILITOT 0.9 12/30/2018 0822   GFRNONAA 68 12/30/2018 0822   GFRNONAA 84 08/24/2018 1017   GFRAA 78 12/30/2018 0822   GFRAA 97 08/24/2018 1017   Lab Results  Component Value Date   CHOL 194 10/30/2022   HDL 54.80 10/30/2022   LDLCALC 123 (H) 10/30/2022   LDLDIRECT 126.0 10/30/2022   TRIG 82.0 10/30/2022   CHOLHDL 4 10/30/2022   Lab Results  Component Value Date   HGBA1C 5.2 02/11/2023   Lab Results  Component Value Date   VITAMINB12 378 02/24/2023   Lab Results  Component Value Date   TSH 2.33 02/11/2023   Positive ATN profile consistent with presence of Alzheimer related pathology.  MRI Brain 03/11/2019 No acute or reversible finding. Mild age related volume loss. Minimal small vessel change of the hemispheric white matter. Tiny old cortical infarctions at the vertex bilaterally.   MRI Brain 03/05/2023 1. No evidence of an acute intracranial abnormality. 2. Small chronic cortical infarcts within the left frontal and bilateral parietal lobes, unchanged from the prior brain MRI of 03/11/2019. 3. Mild chronic small vessel ischemic changes within the cerebral white matter, similar to the prior MRI. 4. Mild-to-moderate generalized cerebral atrophy. 5. A flow void is not well delineated within the right vertebral artery V4 segment distally, suggesting high-grade stenosis or vessel occlusion. Consider CT or MR angiography for further evaluation.  DatScan  06/17/2022 Asymmetric decreased radiotracer activity in the RIGHT striatum compared to the LEFT. Findings equivocal but concerning for Parkinson's syndrome pathology.   ASSESSMENT AND PLAN  73 y.o. year old male with medical history including sleep apnea not on CPAP, hypertension hyperlipidemia, who is presenting for follow-up for his mild Alzheimer dementia and Parkinsonism.   Wife tells me for the past 2 months, his memory got worse to the  point now that he is confused, agitated, has difficulty dressing himself, not recognizing his children.  Wife thinks maybe Sinemet  has something to do with it. At last visit they reported some improvement in his walking and gait but today they tell me they have not seen any improvement with the Sinemet  and since Sinemet  is not known to cause side effect of worsening memory loss, we opted to discontinue medication and continue to monitor him.  I have explained to them that this is unfortunately a neurodegenerative disease, this is progressive and his symptoms will get worse even  when patient is taking the appropriate medications at the appropriate dose.  They voiced understanding.  I will see him as scheduled in July but they understand to contact me sooner if worse or any other concern of question or concerns.    1. Moderate late onset Alzheimer's dementia with mood disturbance (HCC)   2. Parkinsonism, unspecified Parkinsonism type (HCC)     Patient Instructions  Reduce Sinemet  to 1 tablet daily for 2 weeks then stop the medication  Continue with Exelon  and Namenda   Continue your other medications  Return as scheduled in July or sooner if worse.  No orders of the defined types were placed in this encounter.   No orders of the defined types were placed in this encounter.   No follow-ups on file.  I have spent a total of 60 minutes dedicated to this patient today, preparing to see patient, performing a medically appropriate examination and evaluation, ordering tests and/or medications and procedures, and counseling and educating the patient/family/caregiver; independently interpreting result and communicating results to the family/patient/caregiver; and documenting clinical information in the electronic medical record.   Cassandra Cleveland, MD 04/15/2023, 1:15 PM  [facility] [address] [place], [place] [address] [phone]

## 2023-04-15 NOTE — Patient Instructions (Signed)
 Reduce Sinemet  to 1 tablet daily for 2 weeks then stop the medication  Continue with Exelon  and Namenda   Continue your other medications  Return as scheduled in July or sooner if worse.

## 2023-04-16 ENCOUNTER — Other Ambulatory Visit: Payer: Self-pay

## 2023-04-16 ENCOUNTER — Other Ambulatory Visit: Payer: PPO | Admitting: Urology

## 2023-04-16 ENCOUNTER — Ambulatory Visit: Payer: PPO | Admitting: Urology

## 2023-04-16 VITALS — BP 120/77 | HR 84 | Ht 72.0 in | Wt 147.0 lb

## 2023-04-16 DIAGNOSIS — N138 Other obstructive and reflux uropathy: Secondary | ICD-10-CM

## 2023-04-16 DIAGNOSIS — D509 Iron deficiency anemia, unspecified: Secondary | ICD-10-CM | POA: Diagnosis not present

## 2023-04-16 DIAGNOSIS — R194 Change in bowel habit: Secondary | ICD-10-CM | POA: Diagnosis not present

## 2023-04-16 DIAGNOSIS — Z01818 Encounter for other preprocedural examination: Secondary | ICD-10-CM

## 2023-04-16 DIAGNOSIS — N401 Enlarged prostate with lower urinary tract symptoms: Secondary | ICD-10-CM

## 2023-04-16 DIAGNOSIS — R3912 Poor urinary stream: Secondary | ICD-10-CM | POA: Diagnosis not present

## 2023-04-16 DIAGNOSIS — R634 Abnormal weight loss: Secondary | ICD-10-CM | POA: Diagnosis not present

## 2023-04-16 DIAGNOSIS — K22710 Barrett's esophagus with low grade dysplasia: Secondary | ICD-10-CM | POA: Diagnosis not present

## 2023-04-16 LAB — URINALYSIS, COMPLETE
Bilirubin, UA: NEGATIVE
Glucose, UA: NEGATIVE
Ketones, UA: NEGATIVE
Leukocytes,UA: NEGATIVE
Nitrite, UA: NEGATIVE
Protein,UA: NEGATIVE
RBC, UA: NEGATIVE
Specific Gravity, UA: 1.015 (ref 1.005–1.030)
Urobilinogen, Ur: 0.2 mg/dL (ref 0.2–1.0)
pH, UA: 6.5 (ref 5.0–7.5)

## 2023-04-16 LAB — MICROSCOPIC EXAMINATION

## 2023-04-16 NOTE — Progress Notes (Signed)
Surgical Physician Order Form [facility] [place]  Dr. [name], MD  * Scheduling expectation : Next Available-wife pushing for sooner rather than later  *Length of Case: Enucleating median lobe only.  This will be a fairly short case, 45 minutes.  *Clearance needed: yes  *Anticoagulation Instructions: Hold all anticoagulants  *Aspirin Instructions: Hold Aspirin  *Post-op visit Date/Instructions:  1-3 day voiding trial  *Diagnosis: BPH w/urinary obstruction  *Procedure:  HOLEP ([address])   Additional orders: N/A  -Admit type: OUTpatient  -Anesthesia: General  -VTE Prophylaxis Standing Order SCD's       Other:   -Standing Lab Orders Per Anesthesia    Lab other: None  -Standing Test orders EKG/Chest x-ray per Anesthesia       Test other:   - Medications:  Ancef 2gm IV  -Other orders:  N/A

## 2023-04-16 NOTE — Patient Instructions (Signed)

## 2023-04-16 NOTE — Progress Notes (Signed)
   04/16/23  CC:  Chief Complaint  Patient presents with   Cysto    HPI: 73 year old male with progressive urinary symptoms including urgency frequency presents today for cystoscopic evaluation.  He is companied today by his wife.  She notes that he has been having more issues with cognitive decline.  He has also been diagnosed with Parkinson's although presumably he is not particularly exhibiting symptoms of this specifically.  Blood pressure 120/77, pulse 84, height 6' (1.829 m), weight 147 lb (66.7 kg). NED. A&Ox3.   No respiratory distress   Abd soft, NT, ND Normal phallus with bilateral descended testicles  Cystoscopy Procedure Note  Patient identification was confirmed, informed consent was obtained, and patient was prepped using Betadine solution.  Lidocaine jelly was administered per urethral meatus.     Pre-Procedure: - Inspection reveals a normal caliber ureteral meatus.  Procedure: The flexible cystoscope was introduced without difficulty - No urethral strictures/lesions are present. - Enlarged prostate with trilobar coaptation - Elevated bladder neck - Bilateral ureteral orifices identified - Bladder mucosa  reveals no ulcers, tumors, or lesions - No bladder stones - Moderate trabeculation with a few sacules and a small diverticulum on right posterior later side.  Retroflexion shows well-circumscribed moderate size median lobe   Post-Procedure: - Patient tolerated the procedure well  Assessment/ Plan:  1. Benign prostatic hyperplasia with lower urinary tract symptoms, symptom details unspecified (Primary) Lengthy discussion today with the patient and his wife.  He clearly has evidence of chronic outlet obstruction which is likely secondary to large well-circumscribed median lobe causing a ball-valve effect resulting in trabeculation and diverticula within the bladder.  That being said, it is a little bit unclear whether or not this is the entire cause of  his urinary symptoms, some of which may be neurologic in nature in the setting of his newer diagnoses.  We discussed the role of an outlet procedure.  Discussed that if he did pursue this, would want to be very cautious about the amount of prostate resected as his risk for incontinence is higher given his comorbidities.  Specifically today, we discussed holmium laser nucleation of the median lobe alone.  We discussed the risk include risk of bleeding, infection, need for catheter, damage to surrounding structures including bladder perforation, failure to resolve his urinary issues amongst others.  I do think this is a pretty reasonable conservative approach and may have profound effect as well as prevent ongoing obstruction/retention in the future and preserve his bladder function.  They understand that may or may not improve his irritative urinary symptoms.  His wife also has some concerns about his cognitive status as it relates to general anesthesia.  Given that were only get addressed the median lobe, time under anesthesia should be fairly limited.  I will also refer her to the anesthesiologist to discuss this further.  They agree that this is a good plan we will proceed.  Preoperative urine culture sent today. - Urinalysis, Complete - CULTURE, URINE COMPREHENSIVE  2. Weak urinary stream As above - Urinalysis, Complete - CULTURE, URINE COMPREHENSIVE  3. Pre-op testing - CULTURE, URINE COMPREHENSIVE   [name], MD

## 2023-04-16 NOTE — Telephone Encounter (Signed)
His cysto was abnormal and he will be getting scheduled for HOLEP, fyi

## 2023-04-16 NOTE — H&P (View-Only) (Signed)
   04/16/23  CC:  Chief Complaint  Patient presents with   Cysto    HPI: 73 year old male with progressive urinary symptoms including urgency frequency presents today for cystoscopic evaluation.  He is companied today by his wife.  She notes that he has been having more issues with cognitive decline.  He has also been diagnosed with Parkinson's although presumably he is not particularly exhibiting symptoms of this specifically.  Blood pressure 120/77, pulse 84, height 6' (1.829 m), weight 147 lb (66.7 kg). NED. A&Ox3.   No respiratory distress   Abd soft, NT, ND Normal phallus with bilateral descended testicles  Cystoscopy Procedure Note  Patient identification was confirmed, informed consent was obtained, and patient was prepped using Betadine solution.  Lidocaine jelly was administered per urethral meatus.     Pre-Procedure: - Inspection reveals a normal caliber ureteral meatus.  Procedure: The flexible cystoscope was introduced without difficulty - No urethral strictures/lesions are present. - Enlarged prostate with trilobar coaptation - Elevated bladder neck - Bilateral ureteral orifices identified - Bladder mucosa  reveals no ulcers, tumors, or lesions - No bladder stones - Moderate trabeculation with a few sacules and a small diverticulum on right posterior later side.  Retroflexion shows well-circumscribed moderate size median lobe   Post-Procedure: - Patient tolerated the procedure well  Assessment/ Plan:  1. Benign prostatic hyperplasia with lower urinary tract symptoms, symptom details unspecified (Primary) Lengthy discussion today with the patient and his wife.  He clearly has evidence of chronic outlet obstruction which is likely secondary to large well-circumscribed median lobe causing a ball-valve effect resulting in trabeculation and diverticula within the bladder.  That being said, it is a little bit unclear whether or not this is the entire cause of  his urinary symptoms, some of which may be neurologic in nature in the setting of his newer diagnoses.  We discussed the role of an outlet procedure.  Discussed that if he did pursue this, would want to be very cautious about the amount of prostate resected as his risk for incontinence is higher given his comorbidities.  Specifically today, we discussed holmium laser nucleation of the median lobe alone.  We discussed the risk include risk of bleeding, infection, need for catheter, damage to surrounding structures including bladder perforation, failure to resolve his urinary issues amongst others.  I do think this is a pretty reasonable conservative approach and may have profound effect as well as prevent ongoing obstruction/retention in the future and preserve his bladder function.  They understand that may or may not improve his irritative urinary symptoms.  His wife also has some concerns about his cognitive status as it relates to general anesthesia.  Given that were only get addressed the median lobe, time under anesthesia should be fairly limited.  I will also refer her to the anesthesiologist to discuss this further.  They agree that this is a good plan we will proceed.  Preoperative urine culture sent today. - Urinalysis, Complete - CULTURE, URINE COMPREHENSIVE  2. Weak urinary stream As above - Urinalysis, Complete - CULTURE, URINE COMPREHENSIVE  3. Pre-op testing - CULTURE, URINE COMPREHENSIVE   [name], MD

## 2023-04-17 ENCOUNTER — Telehealth: Payer: Self-pay

## 2023-04-17 NOTE — Telephone Encounter (Signed)
Per Dr. Apolinar Junes,  Patient is to be scheduled for Holmium Laser Enucleation of the Prostate   Mr. Usey was contacted and possible surgical dates were discussed, Monday February 3rd, 2025 was agreed upon for surgery.   Patient was directed to call [phone] between 1-3pm the day before surgery to find out surgical arrival time.  Instructions were given not to eat or drink from midnight on the night before surgery and have a driver for the day of surgery. On the surgery day patient was instructed to enter through the Medical Mall entrance of [facility] report the Same Day Surgery desk.   Pre-Admit Testing will be in contact via phone to set up an interview with the anesthesia team to review your history and medications prior to surgery.   Reminder of this information was sent via MyChart to the patient.

## 2023-04-17 NOTE — Telephone Encounter (Signed)
Opened in error

## 2023-04-17 NOTE — Progress Notes (Signed)
   [facility] Posting Form  Surgery Date: Date: 05/04/2023  Surgeon: Dr. [name], MD  Inpt ( No  )   Outpt (Yes)   Obs ( No  )   Diagnosis: N40.1, N13.8 Benign Prostatic Hyperplasia with Urinary Obstruction  -CPT: [phone]  Surgery: Holmium Laser Enucleation of the Prostate  Stop Anticoagulations: Yes and also hold ASA  Cardiac/Medical/Pulmonary Clearance needed: no  *Orders entered into EPIC  Date: 04/17/23   *Case booked in [place]  Date: 04/16/2023  *Notified pt of Surgery: Date: 04/16/2023  PRE-OP UA & CX: no  *Placed into Prior Authorization Work Que Date: 04/17/23  Assistant/laser/rep:No

## 2023-04-19 LAB — CULTURE, URINE COMPREHENSIVE

## 2023-04-22 ENCOUNTER — Other Ambulatory Visit: Payer: Self-pay | Admitting: Internal Medicine

## 2023-04-23 DIAGNOSIS — R194 Change in bowel habit: Secondary | ICD-10-CM | POA: Diagnosis not present

## 2023-04-27 ENCOUNTER — Encounter: Payer: Self-pay | Admitting: Urgent Care

## 2023-04-27 ENCOUNTER — Encounter
Admission: RE | Admit: 2023-04-27 | Discharge: 2023-04-27 | Disposition: A | Discharge status: Home / Self Care | Payer: PPO | Source: Ambulatory Visit | Attending: Urology | Admitting: Urology

## 2023-04-27 ENCOUNTER — Encounter: Payer: Self-pay | Admitting: Student in an Organized Health Care Education/Training Program

## 2023-04-27 ENCOUNTER — Other Ambulatory Visit: Payer: Self-pay

## 2023-04-27 ENCOUNTER — Encounter
Admission: RE | Admit: 2023-04-27 | Discharge: 2023-04-27 | Disposition: A | Discharge status: Home / Self Care | Payer: PPO | Source: Ambulatory Visit | Attending: Urology

## 2023-04-27 ENCOUNTER — Ambulatory Visit: Payer: PPO | Admitting: Student in an Organized Health Care Education/Training Program

## 2023-04-27 ENCOUNTER — Inpatient Hospital Stay: Admission: RE | Admit: 2023-04-27 | Payer: PPO | Source: Ambulatory Visit

## 2023-04-27 VITALS — Ht 71.0 in | Wt 146.0 lb

## 2023-04-27 VITALS — BP 140/76 | HR 72 | Temp 97.6°F | Ht 71.0 in | Wt 148.0 lb

## 2023-04-27 DIAGNOSIS — Z01810 Encounter for preprocedural cardiovascular examination: Secondary | ICD-10-CM | POA: Diagnosis not present

## 2023-04-27 DIAGNOSIS — I10 Essential (primary) hypertension: Secondary | ICD-10-CM

## 2023-04-27 DIAGNOSIS — R911 Solitary pulmonary nodule: Secondary | ICD-10-CM | POA: Diagnosis not present

## 2023-04-27 DIAGNOSIS — R9431 Abnormal electrocardiogram [ECG] [EKG]: Secondary | ICD-10-CM | POA: Diagnosis not present

## 2023-04-27 DIAGNOSIS — Z01818 Encounter for other preprocedural examination: Secondary | ICD-10-CM | POA: Diagnosis present

## 2023-04-27 DIAGNOSIS — Z01812 Encounter for preprocedural laboratory examination: Secondary | ICD-10-CM

## 2023-04-27 HISTORY — DX: Alzheimer's disease with late onset: G30.1

## 2023-04-27 HISTORY — DX: Essential (primary) hypertension: I10

## 2023-04-27 HISTORY — DX: Elevated prostate specific antigen (PSA): R97.20

## 2023-04-27 HISTORY — DX: Dementia in other diseases classified elsewhere, moderate, with mood disturbance: F02.B3

## 2023-04-27 HISTORY — DX: Benign neoplasm of colon, unspecified: D12.6

## 2023-04-27 HISTORY — DX: Barrett's esophagus without dysplasia: K22.70

## 2023-04-27 HISTORY — DX: Cyst of kidney, acquired: N28.1

## 2023-04-27 HISTORY — DX: Benign prostatic hyperplasia with lower urinary tract symptoms: N40.1

## 2023-04-27 HISTORY — DX: Parkinsonism, unspecified: G20.C

## 2023-04-27 HISTORY — DX: Obstructive sleep apnea (adult) (pediatric): G47.33

## 2023-04-27 HISTORY — DX: Hyperlipidemia, unspecified: E78.5

## 2023-04-27 HISTORY — DX: Iron deficiency anemia, unspecified: D50.9

## 2023-04-27 NOTE — Progress Notes (Signed)
Assessment & Plan:   1. 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  He is asymptomatic from a respiratory perspective and only reports some unintentional weight gain.  We will obtain a repeat CT scan of the chest at the 1 year mark to monitor said nodules. Should there be no change in the size of the nodules we will cease any further radiographic monitoring.  - CT CHEST WO CONTRAST; Future   Return in about 1 year (around 04/26/2024).  I spent 45 minutes caring for this patient today, including preparing to see the patient, obtaining a medical history , reviewing a separately obtained history, performing a medically appropriate examination and/or evaluation, counseling and educating the patient/family/caregiver, ordering medications, tests, or procedures, documenting clinical information in the electronic health record, and independently interpreting results (not separately reported/billed) and communicating results to the patient/family/caregiver  Raechel Chute, MD  Pulmonary Critical Care   End of visit medications:  No orders of the defined types were placed in this encounter.    Current Outpatient Medications:    escitalopram (LEXAPRO) 5 MG tablet, TAKE 1 TABLET BY MOUTH ONCE DAILY, Disp: 90 tablet, Rfl: 0   hydrocortisone cream 1 %, Apply 1 Application topically daily as needed for itching., Disp: , Rfl:    memantine (NAMENDA) 10 MG tablet, Take 1 tablet (10 mg total) by mouth 2 (two) times daily., Disp: 180 tablet, Rfl: 1   Misc Natural Products (PROSTATE HEALTH PO), Take 2 tablets by mouth daily., Disp: , Rfl:    RABEprazole (ACIPHEX) 20 MG tablet, Take 1 tablet (20 mg total) by mouth daily. 30 min before breakfast or dinner (Patient taking differently: Take 20 mg by mouth in the morning and at bedtime. 30 min before  breakfast or dinner), Disp: 90 tablet, Rfl: 3   tamsulosin (FLOMAX) 0.4 MG CAPS capsule, Take 2 capsules (0.8 mg total) by mouth daily. (Patient taking differently: Take 0.4 mg by mouth daily after supper.), Disp: 60 capsule, Rfl: 3   traZODone (DESYREL) 100 MG tablet, Take 1 tablet (100 mg total) by mouth at bedtime., Disp: 90 tablet, Rfl: 1   Iron, Ferrous Sulfate, 325 (65 Fe) MG TABS, Take 325 mg by mouth daily. (Patient not taking: Reported on 04/27/2023), Disp: 30 tablet, Rfl: 2   Subjective:   PATIENT ID: [name] [name] GENDER: male DOB: 1950-11-05, MRN: [id number]  Chief Complaint  Patient presents with   Consult    Lung nodule.     HPI  The patient is a pleasant 73 year old male presenting to clinic for the evaluation of a pulmonary nodule.  Patient was in his usual state of health but did notice some weight loss.  Him and his wife report multiple pounds of weight loss without any other symptoms over the past few months.  This was worked up with a CT scan of the chest and abdomen noting multiple subcentimeter pulmonary nodules for which he is referred.  Patient does not have any respiratory symptoms and denies any shortness of breath, cough, or chest pain.  He does not have any sputum production, night sweats, fevers, chills, or any reported hemoptysis.  He has not had any respiratory symptoms in the past.  He does not have any personal history of any malignancy.  He denies any previous history of pulmonary infection or respiratory symptoms.  Used to work for the  city of [place] as well as [employer] and [employer].  He does report some exposure to caustic chemicals but denies direct work with said chemicals.  He does not have a smoking history.  Ancillary information including prior medications, full medical/surgical/family/social histories, and PFTs (when available) are listed below and have been reviewed.   Review of Systems  Constitutional:  Positive for weight  loss. Negative for chills, fever and malaise/fatigue.  Respiratory:  Negative for cough, hemoptysis, sputum production, shortness of breath and wheezing.   Cardiovascular:  Negative for chest pain.     Objective:   Vitals:   04/27/23 1321  BP: (!) 140/76  Pulse: 72  Temp: 97.6 F (36.4 C)  TempSrc: Temporal  SpO2: 100%  Weight: 148 lb (67.1 kg)  Height: 5\' 11"  (1.803 m)   100% on RA BMI Readings from Last 3 Encounters:  04/27/23 20.64 kg/m  04/27/23 20.36 kg/m  04/16/23 19.94 kg/m   Wt Readings from Last 3 Encounters:  04/27/23 148 lb (67.1 kg)  04/27/23 146 lb (66.2 kg)  04/16/23 147 lb (66.7 kg)    Physical Exam Constitutional:      Appearance: Normal appearance. He is not ill-appearing.  Cardiovascular:     Rate and Rhythm: Normal rate and regular rhythm.     Pulses: Normal pulses.     Heart sounds: Normal heart sounds.  Pulmonary:     Effort: Pulmonary effort is normal.     Breath sounds: Normal breath sounds. No wheezing or rales.  Neurological:     General: No focal deficit present.     Mental Status: He is alert. Mental status is at baseline.       Ancillary Information    Past Medical History:  Diagnosis Date   Adenomatous polyp of colon    Barrett's esophagus without dysplasia    Benign prostatic hyperplasia with lower urinary tract symptoms    Blind    partially in right eye with scarring and some scarring in left eye but not blind    COVID-19    03/2020   Depression    remote h/o suicide ideation    Elevated PSA    Essential hypertension    GERD (gastroesophageal reflux disease)    Hyperlipidemia    Iron deficiency anemia    Moderate late onset Alzheimer's dementia with mood disturbance (HCC)    Nodule of left lung 01/2023   Obstructive sleep apnea    not using C-Pap; unable to tolerate from initial use   Parkinsonism (HCC)    Renal cyst, left    Syncope 05/20/2017     Family History  Problem Relation Age of Onset   Dementia  Mother    Stroke Mother        23s   Cancer Father        colon cancer   Stroke Father    Heart attack Father        h/o cig smoker    Prostate cancer Father        ?   Heart disease Father    Other Father        smoker   Cancer Brother        colon cancer dx'ed age 66 as of 06/2021   Esophageal cancer Brother    Heart attack Paternal Grandmother      Past Surgical History:  Procedure Laterality Date   COLONOSCOPY WITH PROPOFOL N/A 04/01/2018   Procedure: COLONOSCOPY WITH PROPOFOL;  Surgeon: Barnetta Chapel  U, MD;  Location: [facility] ENDOSCOPY;  Service: Endoscopy;  Laterality: N/A;   COLONOSCOPY WITH PROPOFOL N/A 07/02/2021   Procedure: COLONOSCOPY WITH PROPOFOL;  Surgeon: Regis Bill, MD;  Location: [facility] ENDOSCOPY;  Service: Endoscopy;  Laterality: N/A;   ESOPHAGOGASTRODUODENOSCOPY N/A 04/01/2018   Procedure: ESOPHAGOGASTRODUODENOSCOPY (EGD);  Surgeon: Christena Deem, MD;  Location: [facility] ENDOSCOPY;  Service: Endoscopy;  Laterality: N/A;   ESOPHAGOGASTRODUODENOSCOPY  01/13/2023   ESOPHAGOGASTRODUODENOSCOPY (EGD) WITH PROPOFOL N/A 07/02/2021   Procedure: ESOPHAGOGASTRODUODENOSCOPY (EGD) WITH PROPOFOL;  Surgeon: Regis Bill, MD;  Location: [facility] ENDOSCOPY;  Service: Endoscopy;  Laterality: N/A;   ESOPHAGOGASTRODUODENOSCOPY (EGD) WITH PROPOFOL N/A 10/07/2021   Procedure: ESOPHAGOGASTRODUODENOSCOPY (EGD) WITH PROPOFOL;  Surgeon: Regis Bill, MD;  Location: [facility] ENDOSCOPY;  Service: Endoscopy;  Laterality: N/A;   HEMORROIDECTOMY  1980   VASECTOMY      Social History   Socioeconomic History   Marital status: Married    Spouse name: Mary   Number of children: 2   Years of education: 12   Highest education level: Associate degree: academic program  Occupational History   Occupation: retired  Tobacco Use   Smoking status: Never    Passive exposure: Never   Smokeless tobacco: Never  Vaping Use   Vaping status: Never Used  Substance and Sexual Activity    Alcohol use: Yes    Comment: occassional wine   Drug use: No   Sexual activity: Yes    Partners: Female  Other Topics Concern   Not on file  Social History Narrative   DPR wife Compton Brigance    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    Never smoker    Wears eye glasses   Social Drivers of Corporate investment banker Strain: Low Risk  (06/25/2021)   Overall Financial Resource Strain (CARDIA)    Difficulty of Paying Living Expenses: Not hard at all  Food Insecurity: No Food Insecurity (06/25/2021)   Hunger Vital Sign    Worried About Running Out of Food in the Last Year: Never true    Ran Out of Food in the Last Year: Never true  Transportation Needs: No Transportation Needs (06/25/2021)   PRAPARE - Administrator, Civil Service (Medical): No    Lack of Transportation (Non-Medical): No  Physical Activity: Sufficiently Active (06/25/2021)   Exercise Vital Sign    Days of Exercise per Week: 5 days    Minutes of Exercise per Session: 60 min  Stress: No Stress Concern Present (06/25/2021)   [employer] of Occupational Health - Occupational Stress Questionnaire    Feeling of Stress : Not at all  Social Connections: Socially Integrated (06/25/2021)   Social Connection and Isolation Panel [NHANES]    Frequency of Communication with Friends and Family: Three times a week    Frequency of Social Gatherings with Friends and Family: Three times a week    Attends Religious Services: More than 4 times per year    Active Member of Clubs or Organizations: Yes    Attends Banker Meetings: More than 4 times per year    Marital Status: Married  Catering manager Violence: Not At Risk (06/25/2021)   Humiliation, Afraid, Rape, and Kick questionnaire    Fear of Current or Ex-Partner: No    Emotionally Abused: No    Physically Abused: No    Sexually Abused: No     Allergies  Allergen Reactions   Omeprazole Diarrhea   Statins Other (  See Comments)     Fatigue,muscle aches  Other Reaction(s): Other (See Comments)     CBC    Component Value Date/Time   WBC 5.7 02/11/2023 1533   RBC 3.82 (L) 02/11/2023 1533   HGB 12.7 (L) 02/11/2023 1533   HGB 16.5 12/30/2018 0822   HCT 36.9 (L) 02/11/2023 1533   HCT 46.5 12/30/2018 0822   PLT 242.0 02/11/2023 1533   PLT 253 12/30/2018 0822   MCV 96.6 02/11/2023 1533   MCV 95 12/30/2018 0822   MCH 33.6 (H) 12/30/2018 0822   MCH 32.5 12/21/2017 1058   MCHC 34.5 02/11/2023 1533   RDW 12.8 02/11/2023 1533   RDW 11.9 12/30/2018 0822   LYMPHSABS 1.4 02/11/2023 1533   LYMPHSABS 1.3 12/30/2018 0822   MONOABS 0.4 02/11/2023 1533   EOSABS 0.1 02/11/2023 1533   EOSABS 0.1 12/30/2018 0822   BASOSABS 0.0 02/11/2023 1533   BASOSABS 0.0 12/30/2018 0822    Pulmonary Functions Testing Results:     No data to display          Outpatient Medications Prior to Visit  Medication Sig Dispense Refill   escitalopram (LEXAPRO) 5 MG tablet TAKE 1 TABLET BY MOUTH ONCE DAILY 90 tablet 0   hydrocortisone cream 1 % Apply 1 Application topically daily as needed for itching.     memantine (NAMENDA) 10 MG tablet Take 1 tablet (10 mg total) by mouth 2 (two) times daily. 180 tablet 1   Misc Natural Products (PROSTATE HEALTH PO) Take 2 tablets by mouth daily.     RABEprazole (ACIPHEX) 20 MG tablet Take 1 tablet (20 mg total) by mouth daily. 30 min before breakfast or dinner (Patient taking differently: Take 20 mg by mouth in the morning and at bedtime. 30 min before breakfast or dinner) 90 tablet 3   tamsulosin (FLOMAX) 0.4 MG CAPS capsule Take 2 capsules (0.8 mg total) by mouth daily. (Patient taking differently: Take 0.4 mg by mouth daily after supper.) 60 capsule 3   traZODone (DESYREL) 100 MG tablet Take 1 tablet (100 mg total) by mouth at bedtime. 90 tablet 1   Iron, Ferrous Sulfate, 325 (65 Fe) MG TABS Take 325 mg by mouth daily. (Patient not taking: Reported on 04/27/2023) 30 tablet 2   No  facility-administered medications prior to visit.

## 2023-04-27 NOTE — Patient Instructions (Addendum)
Your procedure is scheduled on: Monday, February 3 Report to the Registration Desk on the 1st floor of the [facility] To find out your arrival time, please call [phone] between 1PM - 3PM on: Friday, January 31 If your arrival time is 6:00 am, do not arrive before that time as the Medical Mall entrance doors do not open until 6:00 am.  REMEMBER: Instructions that are not followed completely may result in serious medical risk, up to and including death; or upon the discretion of your surgeon and anesthesiologist your surgery may need to be rescheduled.  Do not eat or drink after midnight the night before surgery.  No gum chewing or hard candies.  One week prior to surgery: starting January 27 Stop Anti-inflammatories (NSAIDS) such as Advil, Aleve, Ibuprofen, Motrin, Naproxen, Naprosyn and Aspirin based products such as Excedrin, Goody's Powder, BC Powder. Stop ANY OVER THE COUNTER supplements until after surgery. Stop iron, prostate health.  You may however, continue to take Tylenol if needed for pain up until the day of surgery.  Continue taking all of your other prescription medications up until the day of surgery.  ON THE DAY OF SURGERY ONLY TAKE THESE MEDICATIONS WITH SIPS OF WATER:  escitalopram (LEXAPRO) RABEprazole (ACIPHEX)  memantine (NAMENDA)   No Alcohol for 24 hours before or after surgery.  No Smoking including e-cigarettes for 24 hours before surgery.  No chewable tobacco products for at least 6 hours before surgery.  No nicotine patches on the day of surgery.  Do not use any "recreational" drugs for at least a week (preferably 2 weeks) before your surgery.  Please be advised that the combination of cocaine and anesthesia may have negative outcomes, up to and including death. If you test positive for cocaine, your surgery will be cancelled.  On the morning of surgery brush your teeth with toothpaste and water, you may rinse your mouth with mouthwash if you  wish. Do not swallow any toothpaste or mouthwash.  Do not wear jewelry, make-up, hairpins, clips or nail polish.  For welded (permanent) jewelry: bracelets, anklets, waist bands, etc.  Please have this removed prior to surgery.  If it is not removed, there is a chance that hospital personnel will need to cut it off on the day of surgery.  Do not wear lotions, powders, or perfumes.   Do not shave body hair from the neck down 48 hours before surgery.  Contact lenses, hearing aids and dentures may not be worn into surgery.  Do not bring valuables to the hospital. [facility] is not responsible for any missing/lost belongings or valuables.   Notify your doctor if there is any change in your medical condition (cold, fever, infection).  Wear comfortable clothing (specific to your surgery type) to the hospital.  After surgery, you can help prevent lung complications by doing breathing exercises.  Take deep breaths and cough every 1-2 hours.   If you are being discharged the day of surgery, you will not be allowed to drive home. You will need a responsible individual to drive you home and stay with you for 24 hours after surgery.   If you are taking public transportation, you will need to have a responsible individual with you.  Please call the Pre-admissions Testing Dept. at [phone] if you have any questions about these instructions.  Surgery Visitation Policy:  Patients having surgery or a procedure may have two visitors.  Children under the age of 47 must have an adult with them  who is not the patient.  Temporary Visitor Restrictions Due to increasing cases of flu, RSV and COVID-19: Children ages 59 and under will not be able to visit patients in [facility] hospitals under most circumstances.

## 2023-05-03 MED ORDER — CHLORHEXIDINE GLUCONATE 0.12 % MT SOLN: 15.0000 mL | Freq: Once | OROMUCOSAL | Status: DC

## 2023-05-03 MED ORDER — LACTATED RINGERS IV SOLN: INTRAVENOUS | Status: DC

## 2023-05-03 MED ORDER — CEFAZOLIN SODIUM-DEXTROSE 2-4 GM/100ML-% IV SOLN
2.0000 g | INTRAVENOUS | Status: AC
  Administered 2023-05-04: 2 g via INTRAVENOUS

## 2023-05-03 MED ORDER — ORAL CARE MOUTH RINSE: 15.0000 mL | Freq: Once | OROMUCOSAL | Status: DC

## 2023-05-04 ENCOUNTER — Encounter
Admission: RE | Disposition: A | Discharge status: Home / Self Care | Payer: Self-pay | Source: Home / Self Care | Attending: Urology

## 2023-05-04 ENCOUNTER — Encounter: Payer: Self-pay | Admitting: Urology

## 2023-05-04 ENCOUNTER — Ambulatory Visit
Admission: RE | Admit: 2023-05-04 | Discharge: 2023-05-04 | Disposition: A | Discharge status: Home / Self Care | Payer: PPO | Attending: Urology | Admitting: Urology

## 2023-05-04 ENCOUNTER — Ambulatory Visit: Payer: PPO | Admitting: Urgent Care

## 2023-05-04 ENCOUNTER — Telehealth: Payer: Self-pay | Admitting: *Deleted

## 2023-05-04 ENCOUNTER — Emergency Department
Admission: EM | Admit: 2023-05-04 | Discharge: 2023-05-05 | Discharge status: Against Medical Advice | Payer: PPO | Attending: Emergency Medicine | Admitting: Emergency Medicine

## 2023-05-04 ENCOUNTER — Other Ambulatory Visit: Payer: Self-pay

## 2023-05-04 ENCOUNTER — Emergency Department
Admission: EM | Admit: 2023-05-04 | Discharge: 2023-05-04 | Discharge status: Against Medical Advice | Payer: PPO | Source: Home / Self Care

## 2023-05-04 ENCOUNTER — Ambulatory Visit: Payer: PPO | Admitting: Certified Registered Nurse Anesthetist

## 2023-05-04 DIAGNOSIS — F0390 Unspecified dementia without behavioral disturbance: Secondary | ICD-10-CM | POA: Insufficient documentation

## 2023-05-04 DIAGNOSIS — G4730 Sleep apnea, unspecified: Secondary | ICD-10-CM | POA: Diagnosis not present

## 2023-05-04 DIAGNOSIS — N138 Other obstructive and reflux uropathy: Secondary | ICD-10-CM | POA: Diagnosis not present

## 2023-05-04 DIAGNOSIS — N401 Enlarged prostate with lower urinary tract symptoms: Secondary | ICD-10-CM | POA: Diagnosis not present

## 2023-05-04 DIAGNOSIS — Z5321 Procedure and treatment not carried out due to patient leaving prior to being seen by health care provider: Secondary | ICD-10-CM | POA: Insufficient documentation

## 2023-05-04 DIAGNOSIS — I10 Essential (primary) hypertension: Secondary | ICD-10-CM | POA: Insufficient documentation

## 2023-05-04 DIAGNOSIS — G20A1 Parkinson's disease without dyskinesia, without mention of fluctuations: Secondary | ICD-10-CM | POA: Insufficient documentation

## 2023-05-04 DIAGNOSIS — N3289 Other specified disorders of bladder: Secondary | ICD-10-CM | POA: Insufficient documentation

## 2023-05-04 DIAGNOSIS — N320 Bladder-neck obstruction: Secondary | ICD-10-CM | POA: Diagnosis not present

## 2023-05-04 DIAGNOSIS — Z0389 Encounter for observation for other suspected diseases and conditions ruled out: Secondary | ICD-10-CM | POA: Diagnosis not present

## 2023-05-04 DIAGNOSIS — R451 Restlessness and agitation: Secondary | ICD-10-CM | POA: Insufficient documentation

## 2023-05-04 HISTORY — PX: HOLEP-LASER ENUCLEATION OF THE PROSTATE WITH MORCELLATION: SHX6641

## 2023-05-04 SURGERY — ENUCLEATION, PROSTATE, USING LASER, WITH MORCELLATION
Anesthesia: Spinal | Site: Prostate

## 2023-05-04 MED ORDER — ONDANSETRON HCL 4 MG/2ML IJ SOLN: 4.0000 mg | Freq: Once | INTRAMUSCULAR | Status: DC | PRN

## 2023-05-04 MED ORDER — DEXAMETHASONE SODIUM PHOSPHATE 10 MG/ML IJ SOLN
INTRAMUSCULAR | Status: AC
  Filled 2023-05-04: qty 3

## 2023-05-04 MED ORDER — HYDROCODONE-ACETAMINOPHEN 5-325 MG PO TABS: 1.0000 | ORAL_TABLET | Freq: Four times a day (QID) | ORAL | 0 refills | Status: DC | PRN

## 2023-05-04 MED ORDER — ACETAMINOPHEN 10 MG/ML IV SOLN
INTRAVENOUS | Status: AC
  Filled 2023-05-04: qty 100

## 2023-05-04 MED ORDER — MIDAZOLAM HCL 2 MG/2ML IJ SOLN
INTRAMUSCULAR | Status: AC
  Filled 2023-05-04: qty 2

## 2023-05-04 MED ORDER — FUROSEMIDE 10 MG/ML IJ SOLN
INTRAMUSCULAR | Status: DC | PRN
  Administered 2023-05-04: 10 mg via INTRAMUSCULAR

## 2023-05-04 MED ORDER — ACETAMINOPHEN 325 MG PO TABS
650.0000 mg | ORAL_TABLET | Freq: Once | ORAL | Status: AC
Start: 2023-05-04 — End: 2023-05-04
  Administered 2023-05-04: 650 mg via ORAL

## 2023-05-04 MED ORDER — PROPOFOL 10 MG/ML IV BOLUS
INTRAVENOUS | Status: AC
  Filled 2023-05-04: qty 60

## 2023-05-04 MED ORDER — GLYCOPYRROLATE 0.2 MG/ML IJ SOLN
INTRAMUSCULAR | Status: AC
  Filled 2023-05-04: qty 1

## 2023-05-04 MED ORDER — OXYCODONE HCL 5 MG/5ML PO SOLN: 5.0000 mg | Freq: Once | ORAL | Status: DC | PRN

## 2023-05-04 MED ORDER — ACETAMINOPHEN 325 MG PO TABS
ORAL_TABLET | ORAL | Status: AC
  Filled 2023-05-04: qty 2

## 2023-05-04 MED ORDER — BUPIVACAINE IN DEXTROSE 0.75-8.25 % IT SOLN
INTRATHECAL | Status: DC | PRN
  Administered 2023-05-04: 1.3 mL via INTRATHECAL

## 2023-05-04 MED ORDER — CEFAZOLIN SODIUM 1 G IJ SOLR
INTRAMUSCULAR | Status: AC
  Filled 2023-05-04: qty 20

## 2023-05-04 MED ORDER — OXYCODONE HCL 5 MG PO TABS: 5.0000 mg | ORAL_TABLET | Freq: Once | ORAL | Status: DC | PRN

## 2023-05-04 MED ORDER — LIDOCAINE HCL (PF) 2 % IJ SOLN
INTRAMUSCULAR | Status: AC
  Filled 2023-05-04: qty 15

## 2023-05-04 MED ORDER — FENTANYL CITRATE (PF) 100 MCG/2ML IJ SOLN: 25.0000 ug | INTRAMUSCULAR | Status: DC | PRN

## 2023-05-04 MED ORDER — ONDANSETRON HCL 4 MG/2ML IJ SOLN
INTRAMUSCULAR | Status: AC
  Filled 2023-05-04: qty 6

## 2023-05-04 MED ORDER — FENTANYL CITRATE (PF) 100 MCG/2ML IJ SOLN
INTRAMUSCULAR | Status: AC
  Filled 2023-05-04: qty 2

## 2023-05-04 MED ORDER — ACETAMINOPHEN 10 MG/ML IV SOLN
INTRAVENOUS | Status: DC | PRN
  Administered 2023-05-04: 1000 mg via INTRAVENOUS

## 2023-05-04 MED ORDER — PROPOFOL 10 MG/ML IV BOLUS
INTRAVENOUS | Status: AC
  Filled 2023-05-04: qty 20

## 2023-05-04 MED ORDER — PROPOFOL 500 MG/50ML IV EMUL
INTRAVENOUS | Status: DC | PRN
  Administered 2023-05-04: 25 ug/kg/min via INTRAVENOUS

## 2023-05-04 MED ORDER — SODIUM CHLORIDE 0.9 % IR SOLN
Status: DC | PRN
  Administered 2023-05-04: 3000 mL
  Administered 2023-05-04 (×2): 6000 mL

## 2023-05-04 MED ORDER — FUROSEMIDE 10 MG/ML IJ SOLN
INTRAMUSCULAR | Status: AC
  Filled 2023-05-04: qty 4

## 2023-05-04 SURGICAL SUPPLY — 32 items
ADAPTER IRRIG TUBE 2 SPIKE SOL (ADAPTER) ×2 IMPLANT
BAG URINE DRAIN 2000ML AR STRL (UROLOGICAL SUPPLIES) IMPLANT
BAG URO DRAIN 4000ML (MISCELLANEOUS) IMPLANT
CATH FOL 2WAY LX 20X30 (CATHETERS) IMPLANT
CATH FOL 2WAY LX 22X30 (CATHETERS) IMPLANT
CATH FOLEY 3WAY 30CC 22FR (CATHETERS) IMPLANT
CATH URETL OPEN END 4X70 (CATHETERS) ×1 IMPLANT
CONTAINER COLLECT MORCELLATR (MISCELLANEOUS) ×1 IMPLANT
DRAPE SHEET LG 3/4 BI-LAMINATE (DRAPES) ×1 IMPLANT
DRAPE UTILITY 15X26 TOWEL STRL (DRAPES) IMPLANT
FIBER LASER MOSES 550 DFL (Laser) ×1 IMPLANT
FILTER OVERFLOW MORCELLATOR (FILTER) ×1 IMPLANT
GLOVE BIO SURGEON STRL SZ 6.5 (GLOVE) ×2 IMPLANT
GOWN STRL REUS W/ TWL LRG LVL3 (GOWN DISPOSABLE) ×2 IMPLANT
HOLDER FOLEY CATH W/STRAP (MISCELLANEOUS) ×1 IMPLANT
IV NS IRRIG 3000ML ARTHROMATIC (IV SOLUTION) ×4 IMPLANT
KIT TURNOVER CYSTO (KITS) ×1 IMPLANT
MBRN O SEALING YLW 17 FOR INST (MISCELLANEOUS) ×1 IMPLANT
MEMBRANE SLNG YLW 17 FOR INST (MISCELLANEOUS) ×1 IMPLANT
MORCELLATOR COLLECT CONTAINER (MISCELLANEOUS) ×1 IMPLANT
MORCELLATOR OVERFLOW FILTER (FILTER) ×1 IMPLANT
MORCELLATOR ROTATION 4.75 335 (MISCELLANEOUS) ×1 IMPLANT
PACK CYSTO AR (MISCELLANEOUS) ×1 IMPLANT
SET CYSTO W/LG BORE CLAMP LF (SET/KITS/TRAYS/PACK) IMPLANT
SET IRRIG Y TYPE TUR BLADDER L (SET/KITS/TRAYS/PACK) ×1 IMPLANT
SLEEVE PROTECTION STRL DISP (MISCELLANEOUS) ×2 IMPLANT
SURGILUBE 2OZ TUBE FLIPTOP (MISCELLANEOUS) ×1 IMPLANT
SYR TOOMEY IRRIG 70ML (MISCELLANEOUS) ×1 IMPLANT
SYRINGE TOOMEY IRRIG 70ML (MISCELLANEOUS) ×1 IMPLANT
TUBE PUMP MORCELLATOR PIRANHA (TUBING) ×1 IMPLANT
WATER STERILE IRR 1000ML POUR (IV SOLUTION) ×1 IMPLANT
WATER STERILE IRR 500ML POUR (IV SOLUTION) ×1 IMPLANT

## 2023-05-04 NOTE — Anesthesia Procedure Notes (Signed)
Date/Time: 05/04/2023 8:02 AM  Performed by: Malva Cogan, CRNAPre-anesthesia Checklist: Patient identified, Emergency Drugs available, Suction available, Patient being monitored and Timeout performed Patient Re-evaluated:Patient Re-evaluated prior to induction Oxygen Delivery Method: Nasal cannula Placement Confirmation: CO2 detector and positive ETCO2

## 2023-05-04 NOTE — Anesthesia Preprocedure Evaluation (Signed)
Anesthesia Evaluation  Patient identified by MRN, date of birth, ID band Patient awake    Reviewed: Allergy & Precautions, NPO status , Patient's Chart, lab work & pertinent test results  History of Anesthesia Complications Negative for: history of anesthetic complications  Airway Mallampati: II  TM Distance: >3 FB Neck ROM: Full    Dental no notable dental hx. (+) Teeth Intact   Pulmonary sleep apnea , neg COPD, Patient abstained from smoking.Not current smoker   Pulmonary exam normal breath sounds clear to auscultation       Cardiovascular Exercise Tolerance: Good METShypertension, Pt. on medications (-) CAD and (-) Past MI (-) dysrhythmias  Rhythm:Regular Rate:Normal - Systolic murmurs    Neuro/Psych  PSYCHIATRIC DISORDERS  Depression   Dementia negative neurological ROS     GI/Hepatic ,GERD  Controlled,,(+)     (-) substance abuse    Endo/Other  neg diabetes    Renal/GU negative Renal ROS     Musculoskeletal   Abdominal   Peds  Hematology Denies blood thinner use or bleeding disorders.    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 and some scarring               in left eye but not blind  No date: COVID-19     Comment:  03/2020 No date: Depression     Comment:  remote h/o suicide ideation  No date: Elevated PSA No date: Essential hypertension No date: GERD (gastroesophageal reflux disease) No date: Hyperlipidemia No date: Iron deficiency anemia No date: Moderate late onset Alzheimer's dementia with mood  disturbance (HCC) 01/2023: Nodule of left lung No date: Obstructive sleep apnea     Comment:  not using C-Pap; unable to tolerate from initial use No date: Parkinsonism  (HCC) No date: Renal cyst, left 05/20/2017: Syncope  Reproductive/Obstetrics                             Anesthesia Physical Anesthesia Plan  ASA: 2  Anesthesia Plan: Spinal   Post-op Pain Management: Ofirmev IV (intra-op)*   Induction: Intravenous  PONV Risk Score and Plan: 2 and Ondansetron, Dexamethasone, Propofol infusion, TIVA and Treatment may vary due to age or medical condition  Airway Management Planned: Natural Airway  Additional Equipment: None  Intra-op Plan:   Post-operative Plan: Extubation in OR  Informed Consent: I have reviewed the patients History and Physical, chart, labs and discussed the procedure including the risks, benefits and alternatives for the proposed anesthesia with the patient or authorized representative who has indicated his/her understanding and acceptance.     Dental advisory given  Plan Discussed with: CRNA and Surgeon  Anesthesia Plan Comments: (Had an extensive, long discussion mostly with patient's wife, regarding her concerns about patient's cognitive status and existing dementia and how it will be affected by anesthesia. She states that she has personally done copious research regarding general anesthesia's deleterious effects on the brain and cognitive function, and she states she does NOT want general anesthesia for husband (when asked, the patient himself defers these decisions to the wife). She says she spoke with an "anesthesiologist" by phone who said there are many options that could be done avoiding general anesthesia; I do not see any documented records  of such a conversation.  I did discuss with her and Dr Apolinar Junes the options, and the only two relevant options at this point are spinal anesthesia, general anesthesia with breathing tube, or cancelling the case. I mentioned multiple times that studies have shown that the risks of post-operative cognitive dysfunction are equivocal among different types of  anesthesia, and even procedures performed without anesthesia.  Dr. Apolinar Junes is OK with proceeding under spinal anesthesia. Wife understands. I asked about the circumstance if we are unable to place the spinal, she is OK with proceeding with GETA in that situation. She also understands that GETA is always a backup option in an emergency, such as respiratory distress, aspiration, etc.  Discussed R/B/A of neuraxial anesthesia technique with patient and wife: - rare risks of spinal/epidural hematoma, nerve damage, infection - Risk of PDPH - Risk of nausea and vomiting - Risk of conversion to general anesthesia and its associated risks, including sore throat, damage to lips/eyes/teeth/oropharynx, and rare risks such as cardiac and respiratory events. -post operative cognitive dysfunction - Risk of allergic reactions  Discussed the role of CRNA in patient's perioperative care.  Patient and wife voiced understanding.)        Anesthesia Quick Evaluation

## 2023-05-04 NOTE — Anesthesia Procedure Notes (Signed)
Spinal  Patient location during procedure: OR Reason for block: surgical anesthesia Staffing Performed: resident/CRNA  Anesthesiologist: Corinda Gubler, MD Resident/CRNA: Malva Cogan, CRNA Performed by: Malva Cogan, CRNA Authorized by: Corinda Gubler, MD   Preanesthetic Checklist Completed: patient identified, IV checked, site marked, risks and benefits discussed, surgical consent, monitors and equipment checked, pre-op evaluation and timeout performed Spinal Block Patient position: sitting Prep: ChloraPrep Patient monitoring: heart rate, continuous pulse ox, blood pressure and cardiac monitor Approach: midline Location: L4-5 Injection technique: single-shot Needle Needle type: Whitacre and Introducer  Needle gauge: 24 G Needle length: 9 cm Assessment Sensory level: T10 Events: CSF return Additional Notes Sterile aseptic technique used throughout the procedure.  Negative paresthesia. Negative blood return. Positive free-flowing CSF. Expiration date of kit checked and confirmed. Patient tolerated procedure well, without complications.

## 2023-05-04 NOTE — Transfer of Care (Signed)
Immediate Anesthesia Transfer of Care Note  Patient: DANTHONY KENDRIX  Procedure(s) Performed: HOLEP-LASER ENUCLEATION OF THE PROSTATE WITH MORCELLATION (Prostate)  Patient Location: PACU  Anesthesia Type:Spinal  Level of Consciousness: awake and alert   Airway & Oxygen Therapy: Patient Spontanous Breathing  Post-op Assessment: Report given to RN and Post -op Vital signs reviewed and stable  Post vital signs: Reviewed and stable  Last Vitals:  Vitals Value Taken Time  BP 136/72 05/04/23 0921  Temp 36.4 C 05/04/23 0921  Pulse 50 05/04/23 0924  Resp 15 05/04/23 0924  SpO2 100 % 05/04/23 0924  Vitals shown include unfiled device data.  Last Pain:  Vitals:   05/04/23 0921  TempSrc:   PainSc: 0-No pain      Patients Stated Pain Goal: 0 (05/04/23 7829)  Complications: No notable events documented.

## 2023-05-04 NOTE — Discharge Instructions (Signed)

## 2023-05-04 NOTE — ED Triage Notes (Signed)
Had surgery this morning, catheter in place.  Patient more confused, aggressive.  Has been pulling at catheter at home.  Catheter needs to stay in until Wednesday.  AAOx3.  Skin warm and dry. NAD

## 2023-05-04 NOTE — ED Provider Triage Note (Cosign Needed)
Emergency Medicine Provider Triage Evaluation Note  [name] [name] , a 73 y.o. male  was evaluated in triage.  Pt complains of dementia, patient had prostate surgery this morning, tried to pull the urinary catheter.  His wife wants to check that the catheter is in the right place on place, and his wife wants to to know if he he did hurt himself  Review of Systems  Positive:  Negative:   Physical Exam  BP (!) 153/74 (BP Location: Right Arm)   Pulse 60   Temp 98.4 F (36.9 C) (Oral)   Resp 16   Wt 67 kg   SpO2 100%   BMI 20.60 kg/m  Gen:   Awake, no distress   Resp:  Normal effort  MSK:   Moves extremities without difficulty  Other:    Medical Decision Making  Medically screening exam initiated at 4:52 PM.  Appropriate orders placed.  [name] was informed that the remainder of the evaluation will be completed by another provider, this initial triage assessment does not replace that evaluation, and the importance of remaining in the ED until their evaluation is complete.  Patient follows up with urinary catheter.  Patient needs physical exam to check placement of the catheter and possible lesions   Gladys Damme, PA-C 05/04/23 1654

## 2023-05-04 NOTE — Op Note (Signed)
Date of procedure: 05/04/23  Preoperative diagnosis:  BPH with BOO  Postoperative diagnosis:  same   Procedure: HoLEP with morcellation  Surgeon: [name], MD  Anesthesia: Spinal with sedation  Complications: None  Intraoperative findings: Heavily trabeculated bladder with trilobar coaptation including large intravesical component of median lobe.    EBL: Minima  Specimens: Prostate chips  Drains: 20 cc Foley catheter with 50 cc in the balloon  Indication: [name] [name] is a 73 y.o. patient with refractory urinary symptoms in the setting of BPH with a large median lobe..  After reviewing the management options for treatment, he elected to proceed with the above surgical procedure(s). We have discussed the potential benefits and risks of the procedure, side effects of the proposed treatment, the likelihood of the patient achieving the goals of the procedure, and any potential problems that might occur during the procedure or recuperation. Informed consent has been obtained.  His wife also agrees.  Description of procedure:  The patient was taken to the operating room and a spinal was administered and the patient was sedated.  The patient was placed in the dorsal lithotomy position, prepped and draped in the usual sterile fashion, and preoperative antibiotics were administered. A preoperative time-out was performed.     A 26 French resectoscope sheath using a blunt angled obturator was introduced without difficulty into the bladder.  The bladder was carefully inspected and noted to be moderately trabeculated.  There is an elevated bladder neck with a intravesical component.  The trigone was able to be visualized with some manipulation and the UOs were good distance bladder neck itself.  The prostatic fossa had significant trilobar coaptation with greater than 5 cm prostatic length.  A 550 m laser fiber was then brought in and using settings of 0.9 J's and 60 Hz, 2 incisions were  created at the 5:00 and 7:00 positions of the bladder neck on either side of the median lobe down to the level of the bladder neck/capsular fibers.  The incision was carried down caudally meeting in the midline just above the verumontanum.  The median lobe was then enucleated from a caudal to cranial direction cleaving the adenoma off the underlying capsule rolling it towards the bladder neck and ultimately cleaving the mucosa to free the median lobe into the bladder.    Hemostasis was achieved using hemostatic fiber settings.  Bilateral UOs were visualized and free of any injury.  Finally, the 25 French resectoscope was exchanged for nephroscope and using the Piranha handpiece morcellator, the bladder was distended in each of the prostate chips were evacuated.  Halfway through, visualization became poor and up placing a resectoscope using bipolar along the bladder neck and the base of the resected site.  The bladder was irrigated several times and smaller joules were clear for the bladder.  This point time, there were no residual fibers appreciated in the bladder.  Hemostasis was adequate.  10 mg of IV Lasix was administered to help with postoperative diuresis.  A 20 French two-way Foley catheter was then inserted over a catheter guide with 50 cc in the balloon.  The catheter irrigated easily and well.  Patient was then clean and dry, repositioned supine position, reversed from anesthesia, taken to PACU in stable condition.   Plan: Patient will return to the office in 2 for voiding trial.      [name], M.D.

## 2023-05-04 NOTE — Telephone Encounter (Signed)
Wife called in today and states that [name] [name] is trying to pull out his cathter when he goes to the restroom. Wife is trying to explain to him that he has keep the catheter in . Talked to patient wife about the catheter  and how to attach the bag back on the the catheter.  Patient wife did attach  the bag back on the catheter . She is going to watch it and make sure its draining . If not she is going to take him to the ER. Also advised her to try to put another pair of sweat pants on in between .

## 2023-05-04 NOTE — Anesthesia Postprocedure Evaluation (Signed)
Anesthesia Post Note  Patient: [name] [name]  Procedure(s) Performed: HOLEP-LASER ENUCLEATION OF THE PROSTATE WITH MORCELLATION (Prostate)  Patient location during evaluation: PACU Anesthesia Type: Spinal Level of consciousness: awake and alert Pain management: pain level controlled Vital Signs Assessment: post-procedure vital signs reviewed and stable Respiratory status: spontaneous breathing, nonlabored ventilation, respiratory function stable and patient connected to nasal cannula oxygen Cardiovascular status: blood pressure returned to baseline and stable Postop Assessment: no apparent nausea or vomiting, spinal receding, no headache and patient able to bend at knees Anesthetic complications: no  No notable events documented.   Last Vitals:  Vitals:   05/04/23 1017 05/04/23 1112  BP: (!) 161/76 (!) 165/81  Pulse: (!) 52 67  Resp: 14   Temp: 36.6 C   SpO2: 98% 99%    Last Pain:  Vitals:   05/04/23 1112  TempSrc:   PainSc: 0-No pain                 Corinda Gubler

## 2023-05-04 NOTE — ED Triage Notes (Signed)
Had surgery this am with catheter placement - needs to stay in until Wednesday. Pt has dementia at baseline and has been pulling at catheter and wants it removed. Wife says she is unable to restrain him from pulling at it. Seen today for same but did not stay.

## 2023-05-04 NOTE — Interval H&P Note (Signed)
History and Physical Interval Note:  05/04/2023 7:34 AM  [name] [name]  has presented today for surgery, with the diagnosis of Benign Prostatic Hyperplasia with Urinary Obstruction.  The various methods of treatment have been discussed with the patient and family. After consideration of risks, benefits and other options for treatment, the patient has consented to  Procedure(s): HOLEP-LASER ENUCLEATION OF THE PROSTATE WITH MORCELLATION (N/A) as a surgical intervention.  The patient's history has been reviewed, patient examined, no change in status, stable for surgery.  I have reviewed the patient's chart and labs.  Questions were answered to the patient's satisfaction.    RRR CTAB  [name]

## 2023-05-05 ENCOUNTER — Telehealth: Payer: Self-pay

## 2023-05-05 ENCOUNTER — Other Ambulatory Visit: Payer: PPO | Admitting: Urology

## 2023-05-05 ENCOUNTER — Encounter: Payer: Self-pay | Admitting: Urology

## 2023-05-05 LAB — SURGICAL PATHOLOGY

## 2023-05-05 NOTE — Progress Notes (Signed)
 05/06/2023 8:58 AM   [name] [name] [birth year] [id number]  Referring provider: Marylynn Verneita CROME, MD [address] [place],  [place] [address]  Urological history: 1. Elevated PSA  -PSA (01/2023) 6.71 -Prostate MRI (03/2021) - PI-RADS 3   2. BPH with LU TS -prostate volume (03/2021) 50.90 -Tamsulosin  0.4 mg daily -HoLEP (05/2023) pathology negative for malignancy  3. Renal cysts -contrast CT (01/2023) - parapelvic left renal cysts   4. Bladder diverticula -contrast CT (01/2023) - right sided bladder diverticula   Chief Complaint  Patient presents with   Follow-up   HPI: [name] [name] is a 74 y.o. male who presents today for follow up after HoLEP his wife, [name] [name].   Previous records reviewed.   His appointment was to have his catheter removed after HoLEP surgery, but unfortunately he removed it yesterday during a period of agitation.  As of yesterday, he was voiding on his own.  His wife is not certain if the catheter came out intact.  She will bring the catheter later this afternoon so that I can take a look at it and make sure there is no retained parts in his bladder.     He was having urgency and frequency last night, but it seems to calm down.  He had a lot of blood last night as well.   Patient denies any modifying or aggravating factors.  Patient denies any recent UTI's, dysuria or suprapubic/flank pain.  Patient denies any fevers, chills, nausea or vomiting.    PVR 103 mL   PMH: Past Medical History:  Diagnosis Date   Adenomatous polyp of colon    Barrett's esophagus without dysplasia    Benign prostatic hyperplasia with lower urinary tract symptoms    Blind    partially in right eye with scarring and some scarring in left eye but not blind    COVID-19    03/2020   Depression    remote h/o suicide ideation    Elevated PSA    Essential hypertension    GERD (gastroesophageal reflux disease)    Hyperlipidemia    Iron  deficiency anemia     Moderate late onset Alzheimer's dementia with mood disturbance (HCC)    Nodule of left lung 01/2023   Obstructive sleep apnea    not using C-Pap; unable to tolerate from initial use   Parkinsonism [facility])    Renal cyst, left    Syncope 05/20/2017    Surgical History: Past Surgical History:  Procedure Laterality Date   COLONOSCOPY WITH PROPOFOL  N/A 04/01/2018   Procedure: COLONOSCOPY WITH PROPOFOL ;  Surgeon: Gaylyn Gladis PENNER, MD;  Location: [facility] ENDOSCOPY;  Service: Endoscopy;  Laterality: N/A;   COLONOSCOPY WITH PROPOFOL  N/A 07/02/2021   Procedure: COLONOSCOPY WITH PROPOFOL ;  Surgeon: Maryruth Ole DASEN, MD;  Location: [facility] ENDOSCOPY;  Service: Endoscopy;  Laterality: N/A;   ESOPHAGOGASTRODUODENOSCOPY N/A 04/01/2018   Procedure: ESOPHAGOGASTRODUODENOSCOPY (EGD);  Surgeon: Gaylyn Gladis PENNER, MD;  Location: [facility] ENDOSCOPY;  Service: Endoscopy;  Laterality: N/A;   ESOPHAGOGASTRODUODENOSCOPY  01/13/2023   ESOPHAGOGASTRODUODENOSCOPY (EGD) WITH PROPOFOL  N/A 07/02/2021   Procedure: ESOPHAGOGASTRODUODENOSCOPY (EGD) WITH PROPOFOL ;  Surgeon: Maryruth Ole DASEN, MD;  Location: [facility] ENDOSCOPY;  Service: Endoscopy;  Laterality: N/A;   ESOPHAGOGASTRODUODENOSCOPY (EGD) WITH PROPOFOL  N/A 10/07/2021   Procedure: ESOPHAGOGASTRODUODENOSCOPY (EGD) WITH PROPOFOL ;  Surgeon: Maryruth Ole DASEN, MD;  Location: [facility] ENDOSCOPY;  Service: Endoscopy;  Laterality: N/A;   HEMORROIDECTOMY  1980   HOLEP-LASER ENUCLEATION OF THE PROSTATE WITH MORCELLATION N/A 05/04/2023  Procedure: HOLEP-LASER ENUCLEATION OF THE PROSTATE WITH MORCELLATION;  Surgeon: Penne Knee, MD;  Location: [facility] ORS;  Service: Urology;  Laterality: N/A;   VASECTOMY      Home Medications:  Allergies as of 05/06/2023       Reactions   Omeprazole Diarrhea   Statins Other (See Comments)   Fatigue,muscle aches Other Reaction(s): Other (See Comments)        Medication List        Accurate as of May 06, 2023  8:58 AM. If you have  any questions, ask your nurse or doctor.          escitalopram  5 MG tablet Commonly known as: LEXAPRO  TAKE 1 TABLET BY MOUTH ONCE DAILY   HYDROcodone -acetaminophen  5-325 MG tablet Commonly known as: NORCO/VICODIN Take 1-2 tablets by mouth every 6 (six) hours as needed for moderate pain (pain score 4-6).   hydrocortisone  cream 1 % Apply 1 Application topically daily as needed for itching.   Iron  (Ferrous Sulfate ) 325 (65 Fe) MG Tabs Take 325 mg by mouth daily.   memantine  10 MG tablet Commonly known as: NAMENDA  Take 1 tablet (10 mg total) by mouth 2 (two) times daily.   PROSTATE HEALTH PO Take 2 tablets by mouth daily.   RABEprazole  20 MG tablet Commonly known as: ACIPHEX  Take 1 tablet (20 mg total) by mouth daily. 30 min before breakfast or dinner What changed: when to take this   tamsulosin  0.4 MG Caps capsule Commonly known as: FLOMAX  Take 2 capsules (0.8 mg total) by mouth daily. What changed:  how much to take when to take this   traZODone  100 MG tablet Commonly known as: DESYREL  Take 1 tablet (100 mg total) by mouth at bedtime.        Allergies:  Allergies  Allergen Reactions   Omeprazole Diarrhea   Statins Other (See Comments)    Fatigue,muscle aches  Other Reaction(s): Other (See Comments)    Family History: Family History  Problem Relation Age of Onset   Dementia Mother    Stroke Mother        25s   Cancer Father        colon cancer   Stroke Father    Heart attack Father        h/o cig smoker    Prostate cancer Father        ?   Heart disease Father    Other Father        smoker   Cancer Brother        colon cancer dx'ed age 27 as of 06/2021   Esophageal cancer Brother    Heart attack Paternal Grandmother     Social History:  reports that he has never smoked. He has never been exposed to tobacco smoke. He has never used smokeless tobacco. He reports current alcohol use. He reports that he does not use drugs.  ROS: Pertinent ROS  in HPI  Physical Exam: BP 114/69   Pulse 91   Ht 5' 11 (1.803 m)   Wt 144 lb 6.4 oz (65.5 kg)   BMI 20.14 kg/m   Constitutional:  Well nourished. Alert and oriented, No acute distress. HEENT: Power AT, moist mucus membranes.  Trachea midline, no masses. Cardiovascular: No clubbing, cyanosis, or edema. Respiratory: Normal respiratory effort, no increased work of breathing. Neurologic: Grossly intact, no focal deficits, moving all 4 extremities. Psychiatric: Normal mood and affect.   Laboratory Data: N/A   Pertinent Imaging: Results for orders placed  or performed in visit on 05/06/23  BLADDER SCAN AMB NON-IMAGING   Collection Time: 05/06/23  8:36 AM  Result Value Ref Range   Scan Result 103 ml      Assessment & Plan:    1. BPH with LUTS -PVR < 300 cc  -HoLEP on 05/05/2023  -RTC in one month for I PSS, PVR   2. Urge incontinence -Will reassess when he returns 1 month  She will bring the catheter later today so that I can make sure it is completely intact.   I examined the catheter.  It look like it came apart before it came out of the bladder, so hopefully the balloon deflated and that was the reason why he was able to remove it.   Return in about 1 month (around 06/03/2023) for IPSS and PVR.  These notes generated with voice recognition software. I apologize for typographical errors.  [name] [name]  [facility] [address]  [address] [place], [place] [address] [phone]

## 2023-05-05 NOTE — Telephone Encounter (Signed)
 Mrs Botto called stating that patient pulled out his catheter a little over an hour ago. Patient did urinate just now while I was on the phone with Mrs Trinka. Patient is not in pain. He is having some blood in the urine. Advised seen blood is normal especially with him just pulling his catheter out now . He did have HOLEP on 05/04/23.  Spoke with Dr Penne and she advised to keep his appointment as scheduled for tomorrow 05/06/23 to do bladder scan since he is urinating and not in pain we do not need to see patient today. There are nothing we can do for the trauma to the urethra at this time unless we try and put a catheter back in to help healing process but Mrs Assefa states he is not going to be able to tolerate this.

## 2023-05-06 ENCOUNTER — Encounter: Payer: Self-pay | Admitting: Urology

## 2023-05-06 ENCOUNTER — Other Ambulatory Visit: Payer: PPO | Admitting: Urology

## 2023-05-06 ENCOUNTER — Ambulatory Visit (INDEPENDENT_AMBULATORY_CARE_PROVIDER_SITE_OTHER): Payer: PPO | Admitting: Urology

## 2023-05-06 VITALS — BP 114/69 | HR 91 | Ht 71.0 in | Wt 144.4 lb

## 2023-05-06 DIAGNOSIS — N138 Other obstructive and reflux uropathy: Secondary | ICD-10-CM

## 2023-05-06 DIAGNOSIS — N401 Enlarged prostate with lower urinary tract symptoms: Secondary | ICD-10-CM

## 2023-05-06 DIAGNOSIS — R338 Other retention of urine: Secondary | ICD-10-CM

## 2023-05-06 DIAGNOSIS — N3941 Urge incontinence: Secondary | ICD-10-CM

## 2023-05-06 LAB — BLADDER SCAN AMB NON-IMAGING: Scan Result: 103

## 2023-05-07 ENCOUNTER — Ambulatory Visit: Payer: Self-pay | Admitting: Internal Medicine

## 2023-05-07 NOTE — Telephone Encounter (Signed)
 Copied from CRM [id number]. Topic: Clinical - Pink Word Triage >> May 07, 2023  1:37 PM Robinson H wrote: Reason for Triage: Patients wife stating weight loss issue for husband is getting worse, states patient is eating but food doesn't seem to be nourishing his body. Issue has gotten worse since the last time they spoke with Dr. Marylynn, decision tree denied scheduling.  Ronal [phone]  Chief Complaint: Weight loss Symptoms: Weight loss Frequency: Ongoing Pertinent Negatives: Patient denies NVD Disposition: [] ED /[] Urgent Care (no appt availability in office) / [x] Appointment(In office/virtual)/ []  [facility]/ [] Home Care/ [] Refused Recommended Disposition /[] [facility]/ []  Follow-up with PCP Additional Notes: Spoke to patient's wife on behalf of her husband who has been experiencing weight loss over the course of a year. Patient has lost more than 30 lbs over the course of a year. Patient is not experiencing a lack of appetite or NVD. Patient's wife stated that she has been following the provider's advice and is ensuring that the patient is eating high protein meals, but denied weight gain. Patient's wife was noticeably concerned while on the phone with this RN and requested to touch base with Dr. Marylynn. Patient scheduled for tomorrow with PCP. Advised patient/wife to call back if symptoms worsen.   Reason for Disposition  [1] Continued weight loss AND [2] after medical evaluation by doctor (or NP/PA)  Answer Assessment - Initial Assessment Questions 1. MAIN CONCERN: What is your main concern today?     Unintended weight loss 2. WEIGHT LOSS: How much weight have you lost?  (e.g., lbs., kgs.)  Over what period of time have you lost this weight?  (e.g., number of days, weeks, months, years)     Has lost 30lbs over the course of a year 3. BASELINE WEIGHT: What is your baseline or normal weight? (e.g., How much do you usually weigh?)     States patient's highest  weigh was 180lbs, currently 144lbs 4. CAUSE: What do you think is causing the weight loss? (e.g., depression, anxiety, medicine side effect, pain, trouble swallowing, substance or alcohol use problem, eating disorder)     Unknown 5. PRIOR EVALUATION: Have you been evaluated by a doctor for your weight loss? If Yes, ask When was your last visit? What did your doctor (or NP/PA) tell you about the possible cause?     States patient's PCP is aware of the situation and they have been to multiple appointments with other specialists 7. OTHER SYMPTOMS: Do you have any other symptoms? (e.g., anxiety or depression, blood in stool, breathing difficulty, diarrhea, fever, trouble swallowing)     Denies diarrhea, denies vomiting  Protocols used: Weight Loss - Unintended-A-AH

## 2023-05-07 NOTE — Telephone Encounter (Signed)
 FYI Patient schedule with you tomorrow FYI for continued weight loss.

## 2023-05-08 ENCOUNTER — Ambulatory Visit (INDEPENDENT_AMBULATORY_CARE_PROVIDER_SITE_OTHER): Payer: PPO | Admitting: Internal Medicine

## 2023-05-08 ENCOUNTER — Encounter: Payer: Self-pay | Admitting: Internal Medicine

## 2023-05-08 VITALS — BP 128/72 | HR 70 | Ht 68.0 in | Wt 149.2 lb

## 2023-05-08 DIAGNOSIS — F0283 Dementia in other diseases classified elsewhere, unspecified severity, with mood disturbance: Secondary | ICD-10-CM | POA: Diagnosis not present

## 2023-05-08 DIAGNOSIS — R634 Abnormal weight loss: Secondary | ICD-10-CM | POA: Diagnosis not present

## 2023-05-08 DIAGNOSIS — M545 Low back pain, unspecified: Secondary | ICD-10-CM | POA: Diagnosis not present

## 2023-05-08 DIAGNOSIS — R310 Gross hematuria: Secondary | ICD-10-CM

## 2023-05-08 DIAGNOSIS — G3183 Dementia with Lewy bodies: Secondary | ICD-10-CM | POA: Diagnosis not present

## 2023-05-08 MED ORDER — ESCITALOPRAM OXALATE 5 MG PO TABS: 5.0000 mg | ORAL_TABLET | Freq: Every day | ORAL | 1 refills | Status: DC

## 2023-05-08 MED ORDER — TRAZODONE HCL 100 MG PO TABS: 100.0000 mg | ORAL_TABLET | Freq: Every day | ORAL | 1 refills | Status: DC

## 2023-05-08 NOTE — Progress Notes (Addendum)
 Subjective:  Patient ID: [name] [name], male    DOB: 1950/09/01  Age: 73 y.o. MRN: [id number]  CC: The primary encounter diagnosis was Acute left-sided low back pain without sciatica. Diagnoses of Unintentional weight loss of 10% body weight within 6 months, Lewy body dementia with mood disturbance, unspecified dementia severity (HCC), and Gross hematuria were also pertinent to this visit.   HPI [name] presents for  Chief Complaint  Patient presents with   weight loss    Still concerned about weight loss    [name] [name] is a 73 yr old male with a history of dementia and Parkinson's disease who presents with his wife for follow upon ongoing weight loss.  The weight loss began one year ago,  To date he has lost 24 lbs .  Work  thus far has included chest, abd and pelvic CT, as well as a PET scan, all of which have failed to diagnose occult Ca.  Serologies to rule out hyperthyroidism have also been done.   Has seen GI,  urology,  and neurology.  Exelon  was discontinued  one month ago after GI visit , but his but wife says he is still losing weight despite an intake of adequate protein and fat.  He has not had a nutritional evaluation , mainly because his wife is adamant that his oral intake is more than adequate  to maintain and even to gain weight.  Eats 3 meals daily and  is supplementing with protein shakes  containing 30 grams per serving, along with  protein bars.  SABRA [name] [name] denies abdominal pain and diarrhea, but underwent a cystoscopy on Monday by Rosina Riis with laser ablation  of redundant prostate tissue .  Wore a Foley catheter for 2 days, but he removed it while wife was napping .  Saw urology afterward , no imaging done.  He  is urinating without  a problem and still seeing  some blood when he voids    Outpatient Medications Prior to Visit  Medication Sig Dispense Refill   hydrocortisone  cream 1 % Apply 1 Application topically daily as needed for itching.     memantine  (NAMENDA ) 10  MG tablet Take 1 tablet (10 mg total) by mouth 2 (two) times daily. 180 tablet 1   Misc Natural Products (PROSTATE HEALTH PO) Take 2 tablets by mouth daily.     RABEprazole  (ACIPHEX ) 20 MG tablet Take 1 tablet (20 mg total) by mouth daily. 30 min before breakfast or dinner (Patient taking differently: Take 20 mg by mouth in the morning and at bedtime. 30 min before breakfast or dinner) 90 tablet 3   tamsulosin  (FLOMAX ) 0.4 MG CAPS capsule Take 2 capsules (0.8 mg total) by mouth daily. (Patient taking differently: Take 0.4 mg by mouth daily after supper.) 60 capsule 3   escitalopram  (LEXAPRO ) 5 MG tablet TAKE 1 TABLET BY MOUTH ONCE DAILY 90 tablet 0   HYDROcodone -acetaminophen  (NORCO/VICODIN) 5-325 MG tablet Take 1-2 tablets by mouth every 6 (six) hours as needed for moderate pain (pain score 4-6). (Patient not taking: Reported on 05/08/2023) 6 tablet 0   Iron , Ferrous Sulfate , 325 (65 Fe) MG TABS Take 325 mg by mouth daily. (Patient not taking: Reported on 05/08/2023) 30 tablet 2   traZODone  (DESYREL ) 100 MG tablet Take 1 tablet (100 mg total) by mouth at bedtime. 90 tablet 1   No facility-administered medications prior to visit.    Review of Systems;  Patient denies headache, fevers, malaise, unintentional  weight loss, skin rash, eye pain, sinus congestion and sinus pain, sore throat, dysphagia,  hemoptysis , cough, dyspnea, wheezing, chest pain, palpitations, orthopnea, edema, abdominal pain, nausea, melena, diarrhea, constipation, flank pain, dysuria,  Frequency, nocturia, numbness, tingling, seizures,  Focal weakness, Loss of consciousness,  Tremor, insomnia, depression, anxiety, and suicidal ideation.      Objective:  BP 128/72 (BP Location: Left Arm, Patient Position: Sitting)   Pulse 70   Ht 5' 8 (1.727 m)   Wt 149 lb 3.2 oz (67.7 kg)   SpO2 96%   BMI 22.69 kg/m   BP Readings from Last 3 Encounters:  05/08/23 128/72  05/06/23 114/69  05/04/23 (!) 153/74    Wt Readings from Last 3  Encounters:  05/08/23 149 lb 3.2 oz (67.7 kg)  05/06/23 144 lb 6.4 oz (65.5 kg)  05/04/23 147 lb 11.3 oz (67 kg)    Physical Exam Vitals reviewed.  Constitutional:      General: He is not in acute distress.    Appearance: Normal appearance. He is well-developed, well-groomed and underweight. He is not ill-appearing, toxic-appearing or diaphoretic.  HENT:     Head: Normocephalic and atraumatic.     Right Ear: Tympanic membrane, ear canal and external ear normal. There is no impacted cerumen.     Left Ear: Tympanic membrane, ear canal and external ear normal. There is no impacted cerumen.     Nose: Nose normal.     Mouth/Throat:     Mouth: Mucous membranes are moist.     Pharynx: Oropharynx is clear.  Eyes:     General: No scleral icterus.       Right eye: No discharge.        Left eye: No discharge.     Conjunctiva/sclera: Conjunctivae normal.  Neck:     Thyroid : No thyromegaly.     Vascular: No carotid bruit or JVD.  Cardiovascular:     Rate and Rhythm: Normal rate and regular rhythm.     Heart sounds: Normal heart sounds.  Pulmonary:     Effort: Pulmonary effort is normal. No respiratory distress.     Breath sounds: Normal breath sounds.  Abdominal:     General: Bowel sounds are normal.     Palpations: Abdomen is soft. There is no mass.     Tenderness: There is no abdominal tenderness. There is no guarding or rebound.  Musculoskeletal:        General: Normal range of motion.     Cervical back: Normal range of motion and neck supple.  Lymphadenopathy:     Cervical: No cervical adenopathy.  Skin:    General: Skin is warm and dry.  Neurological:     General: No focal deficit present.     Mental Status: He is alert and oriented to person, place, and time. Mental status is at baseline.  Psychiatric:        Mood and Affect: Mood normal.        Behavior: Behavior normal.        Thought Content: Thought content normal.        Judgment: Judgment normal.    Lab Results   Component Value Date   HGBA1C 5.2 02/11/2023   HGBA1C 5.2 10/30/2022    Lab Results  Component Value Date   CREATININE 0.93 02/11/2023   CREATININE 1.08 10/30/2022   CREATININE 0.95 04/28/2022    Lab Results  Component Value Date   WBC 5.7 02/11/2023   HGB 12.7 (L) 02/11/2023  HCT 36.9 (L) 02/11/2023   PLT 242.0 02/11/2023   GLUCOSE 90 02/11/2023   CHOL 194 10/30/2022   TRIG 82.0 10/30/2022   HDL 54.80 10/30/2022   LDLDIRECT 126.0 10/30/2022   LDLCALC 123 (H) 10/30/2022   ALT 9 02/11/2023   AST 18 02/11/2023   NA 139 02/11/2023   K 4.5 02/11/2023   CL 102 02/11/2023   CREATININE 0.93 02/11/2023   BUN 20 02/11/2023   CO2 31 02/11/2023   TSH 2.33 02/11/2023   PSA 6.71 (H) 02/11/2023   HGBA1C 5.2 02/11/2023   MICROALBUR <0.7 10/30/2022    No results found.  Assessment & Plan:  .Acute left-sided low back pain without sciatica -     Urinalysis, Routine w reflex microscopic -     Urine Culture  Unintentional weight loss of 10% body weight within 6 months Assessment & Plan: Workup thus far has not revealed overactive thyroid  or occult malignancy,  and per wife has continued despite suspending Exelon .  Will screen stools for parasites.    Orders: -     Ova and parasite examination; Future  Lewy body dementia with mood disturbance, unspecified dementia severity (HCC) Assessment & Plan: Diagnosed by Dr Maree in the setting of signs and symptoms clinically representative of Parkinson's Disease . Exelon  suspended due to unexplained weight loss.     Gross hematuria Assessment & Plan: Secondary to traumatic injury to urethra (patient removed his Foley Cathter with bulb inflated several days ago).     Other orders -     Escitalopram  Oxalate; Take 1 tablet (5 mg total) by mouth daily.  Dispense: 90 tablet; Refill: 1 -     traZODone  HCl; Take 1 tablet (100 mg total) by mouth at bedtime.  Dispense: 90 tablet; Refill: 1 -     MICROSCOPIC MESSAGE     I provided 48  minutes of face-to-face time during this encounter reviewing patient's last visit with me, patient's  most recent visit with  Urology,  gastroenterology, and neurology,  recent surgical and non surgical procedures, previous  labs and imaging studies, counseling on currently addressed issues,  and post visit ordering to diagnostics and therapeutics .   Follow-up: Return in about 3 months (around 08/05/2023).   Verneita LITTIE Kettering, MD

## 2023-05-08 NOTE — Patient Instructions (Signed)
 Today we discussed  ruling out a parasite as the cause of [name] [name]'s weight loss with a stool collection  test  please   Return the sample to the hospital

## 2023-05-09 LAB — URINALYSIS, ROUTINE W REFLEX MICROSCOPIC
Bacteria, UA: NONE SEEN /[HPF]
Bilirubin Urine: NEGATIVE
Glucose, UA: NEGATIVE
Hyaline Cast: NONE SEEN /[LPF]
Ketones, ur: NEGATIVE
Nitrite: NEGATIVE
Specific Gravity, Urine: 1.013 (ref 1.001–1.035)
Squamous Epithelial / HPF: NONE SEEN /[HPF] (ref <=5)
pH: 6 (ref 5.0–8.0)

## 2023-05-09 LAB — URINE CULTURE
MICRO NUMBER:: 16056939
Result:: NO GROWTH
SPECIMEN QUALITY:: ADEQUATE

## 2023-05-09 LAB — MICROSCOPIC MESSAGE

## 2023-05-10 ENCOUNTER — Encounter: Payer: Self-pay | Admitting: Internal Medicine

## 2023-05-10 DIAGNOSIS — R310 Gross hematuria: Secondary | ICD-10-CM | POA: Insufficient documentation

## 2023-05-10 NOTE — Assessment & Plan Note (Signed)
 Secondary to traumatic injury to urethra (patient removed his Foley Cathter with bulb inflated several days ago).

## 2023-05-10 NOTE — Assessment & Plan Note (Addendum)
 Diagnosed by Dr Mason Sole in the setting of signs and symptoms clinically representative of Parkinson's Disease . Exelon  suspended due to unexplained weight loss.

## 2023-05-10 NOTE — Assessment & Plan Note (Signed)
 Workup thus far has not revealed overactive thyroid  or occult malignancy,  and per wife has continued despite suspending Exelon .  Will screen stools for parasites.

## 2023-05-11 ENCOUNTER — Other Ambulatory Visit
Admission: RE | Admit: 2023-05-11 | Discharge: 2023-05-11 | Disposition: A | Discharge status: Home / Self Care | Payer: PPO | Source: Ambulatory Visit | Attending: Internal Medicine | Admitting: Internal Medicine

## 2023-05-11 DIAGNOSIS — R634 Abnormal weight loss: Secondary | ICD-10-CM | POA: Insufficient documentation

## 2023-05-11 NOTE — Addendum Note (Signed)
 Addended by: Ara Bays C on: 05/11/2023 11:53 AM   Modules accepted: Orders

## 2023-05-14 ENCOUNTER — Encounter: Payer: Self-pay | Admitting: Internal Medicine

## 2023-05-14 LAB — O&P RESULT

## 2023-05-14 LAB — OVA + PARASITE EXAM

## 2023-06-01 ENCOUNTER — Telehealth: Payer: Self-pay

## 2023-06-01 DIAGNOSIS — R634 Abnormal weight loss: Secondary | ICD-10-CM

## 2023-06-01 NOTE — Telephone Encounter (Signed)
 Per last result note message pt needs to repeat the Ova a parasite testing. Pt stopped by the office to pick up the kit. Order has also been placed.

## 2023-06-02 NOTE — Progress Notes (Unsigned)
 06/03/2023 1:54 PM   [name] [name] July 16, 1950 [id number]  Referring provider: Sherlene Shams, MD [address] [place],  [place] [address]  Urological history: 1. Elevated PSA  -PSA (01/2023) 6.71 -Prostate MRI (03/2021) - PI-RADS 3   2. BPH with LU TS -prostate volume (03/2021) 50.90 -Tamsulosin 0.4 mg daily -HoLEP (05/2023) pathology negative for malignancy  3. Renal cysts -contrast CT (01/2023) - parapelvic left renal cysts   4. Bladder diverticula -contrast CT (01/2023) - right sided bladder diverticula   Chief Complaint  Patient presents with   Follow-up   HPI: [name] [name] is a 73 y.o. male who presents today for follow up his wife, [name] Dandy.   Previous records reviewed.   I PSS 4/2  PVR 83 mL   He is actually done pretty well since the HoLEP procedure and traumatically removing his Foley.  He has had no incontinence.  He is making less trips to the bathroom.  Patient denies any modifying or aggravating factors.  Patient denies any recent UTI's, gross hematuria, dysuria or suprapubic/flank pain.  Patient denies any fevers, chills, nausea or vomiting.     IPSS     Row Name 06/03/23 1300         International Prostate Symptom Score   How often have you had the sensation of not emptying your bladder? Not at All     How often have you had to urinate less than every two hours? Less than half the time     How often have you found you stopped and started again several times when you urinated? Not at All     How often have you found it difficult to postpone urination? Not at All     How often have you had a weak urinary stream? Not at All     How often have you had to strain to start urination? Not at All     How many times did you typically get up at night to urinate? 2 Times     Total IPSS Score 4       Quality of Life due to urinary symptoms   If you were to spend the rest of your life with your urinary condition just the way it is now how  would you feel about that? Mostly Satisfied              Score:  1-7 Mild 8-19 Moderate 20-35 Severe   PMH: Past Medical History:  Diagnosis Date   Adenomatous polyp of colon    Barrett's esophagus without dysplasia    Benign prostatic hyperplasia with lower urinary tract symptoms    Blind    partially in right eye with scarring and some scarring in left eye but not blind    COVID-19    03/2020   Depression    remote h/o suicide ideation    Elevated PSA    Essential hypertension    GERD (gastroesophageal reflux disease)    Hyperlipidemia    Iron deficiency anemia    Moderate late onset Alzheimer's dementia with mood disturbance (HCC)    Nodule of left lung 01/2023   Obstructive sleep apnea    not using C-Pap; unable to tolerate from initial use   Parkinsonism [facility])    Renal cyst, left    Syncope 05/20/2017    Surgical History: Past Surgical History:  Procedure Laterality Date   COLONOSCOPY WITH PROPOFOL N/A 04/01/2018   Procedure: COLONOSCOPY WITH PROPOFOL;  Surgeon: Christena Deem, MD;  Location[facility] ENDOSCOPY;  Service: Endoscopy;  Laterality: N/A;   COLONOSCOPY WITH PROPOFOL N/A 07/02/2021   Procedure: COLONOSCOPY WITH PROPOFOL;  Surgeon: Regis Bill, MD;  Location: [facility] ENDOSCOPY;  Service: Endoscopy;  Laterality: N/A;   ESOPHAGOGASTRODUODENOSCOPY N/A 04/01/2018   Procedure: ESOPHAGOGASTRODUODENOSCOPY (EGD);  Surgeon: Christena Deem, MD;  Location: [facility] ENDOSCOPY;  Service: Endoscopy;  Laterality: N/A;   ESOPHAGOGASTRODUODENOSCOPY  01/13/2023   ESOPHAGOGASTRODUODENOSCOPY (EGD) WITH PROPOFOL N/A 07/02/2021   Procedure: ESOPHAGOGASTRODUODENOSCOPY (EGD) WITH PROPOFOL;  Surgeon: Regis Bill, MD;  Location: [facility] ENDOSCOPY;  Service: Endoscopy;  Laterality: N/A;   ESOPHAGOGASTRODUODENOSCOPY (EGD) WITH PROPOFOL N/A 10/07/2021   Procedure: ESOPHAGOGASTRODUODENOSCOPY (EGD) WITH PROPOFOL;  Surgeon: Regis Bill, MD;  Location: [facility]  ENDOSCOPY;  Service: Endoscopy;  Laterality: N/A;   HEMORROIDECTOMY  1980   HOLEP-LASER ENUCLEATION OF THE PROSTATE WITH MORCELLATION N/A 05/04/2023   Procedure: HOLEP-LASER ENUCLEATION OF THE PROSTATE WITH MORCELLATION;  Surgeon: [name], MD;  Location: [facility] ORS;  Service: Urology;  Laterality: N/A;   VASECTOMY      Home Medications:  Allergies as of 06/03/2023       Reactions   Omeprazole Diarrhea   Statins Other (See Comments)   Fatigue,muscle aches Other Reaction(s): Other (See Comments)        Medication List        Accurate as of June 03, 2023  1:54 PM. If you have any questions, ask your nurse or doctor.          escitalopram 5 MG tablet Commonly known as: LEXAPRO Take 1 tablet (5 mg total) by mouth daily.   hydrocortisone cream 1 % Apply 1 Application topically daily as needed for itching.   memantine 10 MG tablet Commonly known as: NAMENDA Take 1 tablet (10 mg total) by mouth 2 (two) times daily.   PROSTATE HEALTH PO Take 2 tablets by mouth daily.   RABEprazole 20 MG tablet Commonly known as: ACIPHEX Take 1 tablet (20 mg total) by mouth daily. 30 min before breakfast or dinner What changed: when to take this   tamsulosin 0.4 MG Caps capsule Commonly known as: FLOMAX Take 2 capsules (0.8 mg total) by mouth daily. What changed:  how much to take when to take this   traZODone 100 MG tablet Commonly known as: DESYREL Take 1 tablet (100 mg total) by mouth at bedtime.        Allergies:  Allergies  Allergen Reactions   Omeprazole Diarrhea   Statins Other (See Comments)    Fatigue,muscle aches  Other Reaction(s): Other (See Comments)    Family History: Family History  Problem Relation Age of Onset   Dementia Mother    Stroke Mother        27s   Cancer Father        colon cancer   Stroke Father    Heart attack Father        h/o cig smoker    Prostate cancer Father        ?   Heart disease Father    Other Father        smoker    Cancer Brother        colon cancer dx'ed age 71 as of 06/2021   Esophageal cancer Brother    Heart attack Paternal Grandmother     Social History:  reports that he has never smoked. He has never been exposed to tobacco smoke. He has never used smokeless tobacco. He  reports current alcohol use. He reports that he does not use drugs.  ROS: Pertinent ROS in HPI  Physical Exam: BP 133/76   Pulse 65   Constitutional:  Well nourished. Alert and oriented, No acute distress. HEENT:  AT, moist mucus membranes.  Trachea midline Cardiovascular: No clubbing, cyanosis, or edema. Respiratory: Normal respiratory effort, no increased work of breathing. Neurologic: Grossly intact, no focal deficits, moving all 4 extremities. Psychiatric: Normal mood and affect.   Laboratory Data: N/A   Pertinent Imaging: Results for orders placed or performed in visit on 06/03/23  BLADDER SCAN AMB NON-IMAGING   Collection Time: 06/03/23  1:47 PM  Result Value Ref Range   Scan Result 83 ml      Assessment & Plan:    1. BPH with LUTS -PVR < 300 cc  -HoLEP on 05/05/2023  -RTC May w/ Dr. Apolinar Junes   2. Urge incontinence -resolved   Return for keep follow up appointment with Dr. Apolinar Junes .  These notes generated with voice recognition software. I apologize for typographical errors.  Cloretta Ned  [facility] [address]  [address] [place], [place] [address] [phone]

## 2023-06-03 ENCOUNTER — Encounter: Payer: Self-pay | Admitting: Urology

## 2023-06-03 ENCOUNTER — Ambulatory Visit: Payer: PPO | Admitting: Urology

## 2023-06-03 VITALS — BP 133/76 | HR 65

## 2023-06-03 DIAGNOSIS — N3941 Urge incontinence: Secondary | ICD-10-CM

## 2023-06-03 DIAGNOSIS — N401 Enlarged prostate with lower urinary tract symptoms: Secondary | ICD-10-CM

## 2023-06-03 DIAGNOSIS — N138 Other obstructive and reflux uropathy: Secondary | ICD-10-CM

## 2023-06-03 LAB — BLADDER SCAN AMB NON-IMAGING: Scan Result: 83

## 2023-06-08 ENCOUNTER — Other Ambulatory Visit: Payer: Self-pay | Admitting: Neurology

## 2023-07-02 DIAGNOSIS — R0981 Nasal congestion: Secondary | ICD-10-CM | POA: Diagnosis not present

## 2023-07-02 DIAGNOSIS — J309 Allergic rhinitis, unspecified: Secondary | ICD-10-CM | POA: Diagnosis not present

## 2023-07-02 DIAGNOSIS — R1310 Dysphagia, unspecified: Secondary | ICD-10-CM | POA: Diagnosis not present

## 2023-07-15 DIAGNOSIS — H31003 Unspecified chorioretinal scars, bilateral: Secondary | ICD-10-CM | POA: Diagnosis not present

## 2023-07-15 DIAGNOSIS — H2513 Age-related nuclear cataract, bilateral: Secondary | ICD-10-CM | POA: Diagnosis not present

## 2023-07-15 DIAGNOSIS — H0288B Meibomian gland dysfunction left eye, upper and lower eyelids: Secondary | ICD-10-CM | POA: Diagnosis not present

## 2023-07-15 DIAGNOSIS — H5213 Myopia, bilateral: Secondary | ICD-10-CM | POA: Diagnosis not present

## 2023-07-15 DIAGNOSIS — H0288A Meibomian gland dysfunction right eye, upper and lower eyelids: Secondary | ICD-10-CM | POA: Diagnosis not present

## 2023-08-06 ENCOUNTER — Other Ambulatory Visit: Payer: Self-pay

## 2023-08-06 DIAGNOSIS — R972 Elevated prostate specific antigen [PSA]: Secondary | ICD-10-CM

## 2023-08-06 DIAGNOSIS — N401 Enlarged prostate with lower urinary tract symptoms: Secondary | ICD-10-CM | POA: Diagnosis not present

## 2023-08-06 DIAGNOSIS — R351 Nocturia: Secondary | ICD-10-CM | POA: Diagnosis not present

## 2023-08-07 LAB — PSA: Prostate Specific Ag, Serum: 3 ng/mL (ref 0.0–4.0)

## 2023-08-11 ENCOUNTER — Ambulatory Visit (INDEPENDENT_AMBULATORY_CARE_PROVIDER_SITE_OTHER): Payer: Self-pay | Admitting: Urology

## 2023-08-11 VITALS — BP 172/77 | HR 52 | Ht 68.0 in | Wt 149.0 lb

## 2023-08-11 DIAGNOSIS — N138 Other obstructive and reflux uropathy: Secondary | ICD-10-CM | POA: Diagnosis not present

## 2023-08-11 DIAGNOSIS — N401 Enlarged prostate with lower urinary tract symptoms: Secondary | ICD-10-CM | POA: Diagnosis not present

## 2023-08-11 LAB — BLADDER SCAN AMB NON-IMAGING

## 2023-08-11 NOTE — Progress Notes (Signed)
 I,[name] [name],acting as a scribe for [name], MD.,have documented all relevant documentation on the behalf of [name], MD,as directed by  [name], MD while in the presence of [name], MD.  08/11/2023 2:38 PM   [name] [name] [name] [name] 05/25/1950 [id number]  Referring provider: Thersia Flax, MD [address] [place],  [place] [address]  Chief Complaint  Patient presents with   Benign Prostatic Hypertrophy    HPI: 73 year-old male with a personal history of elevated PSA and benign prostatic hyperplasia (BPH) with lower urinary tract symptoms (LUTS) presents for follow-up.   He underwent a holmium laser enucleation of the prostate (HoLEP) in February 2025 and was noted to have a heavily trabeculated bladder with a large median lobe component. Post-surgery, his PSA has decreased to 3.0, which is expected to be his new baseline. Surgical pathology confirmed BPH. Care was taken to avoid significant resection and only the median lobe was resected, given his history of Parkinson's, cognitive decline, etc.   He's still on Flomax , taking it once a day.  Post-operatively, he initially experienced improvement in symptoms but now reports frequent urination and uncertainty about the need to void, possibly due to anxiety about having accidents. He has some nocturia still. He feels his stream is better and is his emptying fully.  He denies burning or blood in the urine.   Results for orders placed or performed in visit on 08/11/23  BLADDER SCAN AMB NON-IMAGING  Result Value Ref Range   Scan Result 21ml     PMH: Past Medical History:  Diagnosis Date   Adenomatous polyp of colon    Barrett's esophagus without dysplasia    Benign prostatic hyperplasia with lower urinary tract symptoms    Blind    partially in right eye with scarring and some scarring in left eye but not blind    COVID-19    03/2020   Depression    remote h/o suicide ideation    Elevated PSA     Essential hypertension    GERD (gastroesophageal reflux disease)    Hyperlipidemia    Iron  deficiency anemia    Moderate late onset Alzheimer's dementia with mood disturbance (HCC)    Nodule of left lung 01/2023   Obstructive sleep apnea    not using C-Pap; unable to tolerate from initial use   Parkinsonism [facility])    Renal cyst, left    Syncope 05/20/2017    Surgical History: Past Surgical History:  Procedure Laterality Date   COLONOSCOPY WITH PROPOFOL  N/A 04/01/2018   Procedure: COLONOSCOPY WITH PROPOFOL ;  Surgeon: Deveron Fly, MD;  Location: [facility] ENDOSCOPY;  Service: Endoscopy;  Laterality: N/A;   COLONOSCOPY WITH PROPOFOL  N/A 07/02/2021   Procedure: COLONOSCOPY WITH PROPOFOL ;  Surgeon: Shane Darling, MD;  Location: [facility] ENDOSCOPY;  Service: Endoscopy;  Laterality: N/A;   ESOPHAGOGASTRODUODENOSCOPY N/A 04/01/2018   Procedure: ESOPHAGOGASTRODUODENOSCOPY (EGD);  Surgeon: Deveron Fly, MD;  Location: [facility] ENDOSCOPY;  Service: Endoscopy;  Laterality: N/A;   ESOPHAGOGASTRODUODENOSCOPY  01/13/2023   ESOPHAGOGASTRODUODENOSCOPY (EGD) WITH PROPOFOL  N/A 07/02/2021   Procedure: ESOPHAGOGASTRODUODENOSCOPY (EGD) WITH PROPOFOL ;  Surgeon: Shane Darling, MD;  Location: [facility] ENDOSCOPY;  Service: Endoscopy;  Laterality: N/A;   ESOPHAGOGASTRODUODENOSCOPY (EGD) WITH PROPOFOL  N/A 10/07/2021   Procedure: ESOPHAGOGASTRODUODENOSCOPY (EGD) WITH PROPOFOL ;  Surgeon: Shane Darling, MD;  Location: [facility] ENDOSCOPY;  Service: Endoscopy;  Laterality: N/A;   HEMORROIDECTOMY  1980   HOLEP-LASER ENUCLEATION OF THE PROSTATE WITH MORCELLATION N/A 05/04/2023  Procedure: HOLEP-LASER ENUCLEATION OF THE PROSTATE WITH MORCELLATION;  Surgeon: [name], MD;  Location: [facility] ORS;  Service: Urology;  Laterality: N/A;   VASECTOMY      Home Medications:  Allergies as of 08/11/2023       Reactions   Omeprazole Diarrhea   Statins Other (See Comments)   Fatigue,muscle aches Other  Reaction(s): Other (See Comments)        Medication List        Accurate as of Aug 11, 2023  2:38 PM. If you have any questions, ask your nurse or doctor.          STOP taking these medications    traZODone  100 MG tablet Commonly known as: DESYREL        TAKE these medications    escitalopram  5 MG tablet Commonly known as: LEXAPRO  Take 1 tablet (5 mg total) by mouth daily.   hydrocortisone  cream 1 % Apply 1 Application topically daily as needed for itching.   memantine  10 MG tablet Commonly known as: NAMENDA  TAKE 1 TABLET BY MOUTH TWICE DAILY   PROSTATE HEALTH PO Take 2 tablets by mouth daily.   RABEprazole  20 MG tablet Commonly known as: ACIPHEX  Take 1 tablet (20 mg total) by mouth daily. 30 min before breakfast or dinner What changed: when to take this   tamsulosin  0.4 MG Caps capsule Commonly known as: FLOMAX  Take 2 capsules (0.8 mg total) by mouth daily. What changed:  how much to take when to take this        Allergies:  Allergies  Allergen Reactions   Omeprazole Diarrhea   Statins Other (See Comments)    Fatigue,muscle aches  Other Reaction(s): Other (See Comments)    Family History: Family History  Problem Relation Age of Onset   Dementia Mother    Stroke Mother        52s   Cancer Father        colon cancer   Stroke Father    Heart attack Father        h/o cig smoker    Prostate cancer Father        ?   Heart disease Father    Other Father        smoker   Cancer Brother        colon cancer dx'ed age 69 as of 06/2021   Esophageal cancer Brother    Heart attack Paternal Grandmother     Social History:  reports that he has never smoked. He has never been exposed to tobacco smoke. He has never used smokeless tobacco. He reports current alcohol use. He reports that he does not use drugs.   Physical Exam: BP (!) 172/77   Pulse (!) 52   Ht 5\' 8"  (1.727 m)   Wt 149 lb (67.6 kg)   BMI 22.66 kg/m   Constitutional:  Alert and  oriented, No acute distress. HEENT:  AT, moist mucus membranes.  Trachea midline, no masses. Neurologic: Grossly intact, no focal deficits, moving all 4 extremities. Psychiatric: Normal mood and affect.  Results for orders placed or performed in visit on 08/11/23  BLADDER SCAN AMB NON-IMAGING   Collection Time: 08/11/23  9:54 AM  Result Value Ref Range   Scan Result 21ml      Assessment & Plan:    1. History of elevated PSA - His PSA has decreased to 3.0 following surgery, which is expected to be his new baseline. No further PSA checks are needed at  this time.  2. BPH with LUTS - Status post HoLEP. Improvement in symptoms, but still experiences urinary frequency and urgency.   - He has a history of Parkinson's disease, which is associated with overactive bladder. - Discussed holding flomax  to see if he still needs this medication, d/c if no difference -Emptying bladder well -Discussed option of treating OAB, prefers conservative management at this time.     Return if symptoms worsen or fail to improve.  I have reviewed the above documentation for accuracy and completeness, and I agree with the above.   [name], MD   [facility] [address], [address] [place], [place] [address] [phone]

## 2023-08-18 DIAGNOSIS — L821 Other seborrheic keratosis: Secondary | ICD-10-CM | POA: Diagnosis not present

## 2023-08-18 DIAGNOSIS — L570 Actinic keratosis: Secondary | ICD-10-CM | POA: Diagnosis not present

## 2023-08-18 DIAGNOSIS — D2261 Melanocytic nevi of right upper limb, including shoulder: Secondary | ICD-10-CM | POA: Diagnosis not present

## 2023-08-18 DIAGNOSIS — D2262 Melanocytic nevi of left upper limb, including shoulder: Secondary | ICD-10-CM | POA: Diagnosis not present

## 2023-08-18 DIAGNOSIS — D225 Melanocytic nevi of trunk: Secondary | ICD-10-CM | POA: Diagnosis not present

## 2023-08-18 DIAGNOSIS — D2272 Melanocytic nevi of left lower limb, including hip: Secondary | ICD-10-CM | POA: Diagnosis not present

## 2023-08-18 DIAGNOSIS — D2271 Melanocytic nevi of right lower limb, including hip: Secondary | ICD-10-CM | POA: Diagnosis not present

## 2023-08-28 DIAGNOSIS — Z01818 Encounter for other preprocedural examination: Secondary | ICD-10-CM | POA: Diagnosis not present

## 2023-08-31 DIAGNOSIS — K219 Gastro-esophageal reflux disease without esophagitis: Secondary | ICD-10-CM | POA: Diagnosis not present

## 2023-08-31 DIAGNOSIS — K22710 Barrett's esophagus with low grade dysplasia: Secondary | ICD-10-CM | POA: Diagnosis not present

## 2023-09-02 DIAGNOSIS — R1310 Dysphagia, unspecified: Secondary | ICD-10-CM | POA: Diagnosis not present

## 2023-09-02 DIAGNOSIS — K449 Diaphragmatic hernia without obstruction or gangrene: Secondary | ICD-10-CM | POA: Diagnosis not present

## 2023-09-02 DIAGNOSIS — Z6821 Body mass index (BMI) 21.0-21.9, adult: Secondary | ICD-10-CM | POA: Diagnosis not present

## 2023-09-02 DIAGNOSIS — K3189 Other diseases of stomach and duodenum: Secondary | ICD-10-CM | POA: Diagnosis not present

## 2023-09-02 DIAGNOSIS — K2270 Barrett's esophagus without dysplasia: Secondary | ICD-10-CM | POA: Diagnosis not present

## 2023-09-02 DIAGNOSIS — K22710 Barrett's esophagus with low grade dysplasia: Secondary | ICD-10-CM | POA: Diagnosis not present

## 2023-09-02 DIAGNOSIS — R634 Abnormal weight loss: Secondary | ICD-10-CM | POA: Diagnosis not present

## 2023-09-21 ENCOUNTER — Encounter: Payer: Self-pay | Admitting: Podiatry

## 2023-09-21 ENCOUNTER — Ambulatory Visit: Admitting: Podiatry

## 2023-09-21 DIAGNOSIS — B351 Tinea unguium: Secondary | ICD-10-CM

## 2023-09-21 DIAGNOSIS — M2012 Hallux valgus (acquired), left foot: Secondary | ICD-10-CM

## 2023-09-21 DIAGNOSIS — M79675 Pain in left toe(s): Secondary | ICD-10-CM

## 2023-09-21 DIAGNOSIS — M2011 Hallux valgus (acquired), right foot: Secondary | ICD-10-CM

## 2023-09-21 DIAGNOSIS — M79674 Pain in right toe(s): Secondary | ICD-10-CM | POA: Diagnosis not present

## 2023-09-26 ENCOUNTER — Encounter: Payer: Self-pay | Admitting: Podiatry

## 2023-09-26 NOTE — Progress Notes (Signed)
 Subjective: [name] [name] presents today referred by Marylynn Verneita CROME, MD for complaint of with chief concern of elongated, thickened, painful, discolored toenails for months. Patient has tried self attempt at trimming toenail..  Chief Complaint  Patient presents with   RFC    Rm3 RFC not diabetic/Dr tullo Last visit Feb. 2025    Past Medical History:  Diagnosis Date   Adenomatous polyp of colon    Barrett's esophagus without dysplasia    Benign prostatic hyperplasia with lower urinary tract symptoms    Blind    partially in right eye with scarring and some scarring in left eye but not blind    COVID-19    03/2020   Depression    remote h/o suicide ideation    Elevated PSA    Essential hypertension    GERD (gastroesophageal reflux disease)    Hyperlipidemia    Iron  deficiency anemia    Moderate late onset Alzheimer's dementia with mood disturbance (HCC)    Nodule of left lung 01/2023   Obstructive sleep apnea    not using C-Pap; unable to tolerate from initial use   Parkinsonism (HCC)    Renal cyst, left    Syncope 05/20/2017     Patient Active Problem List   Diagnosis Date Noted   Gross hematuria 05/10/2023   Urinary frequency 03/10/2023   Parkinson's syndrome (HCC) 02/11/2023   Unintentional weight loss of 10% body weight within 6 months 02/10/2023   Myalgia due to statin 04/29/2022   Lewy body dementia with mood disturbance (HCC) 04/28/2022   Annual physical exam 07/23/2020   Chronic insomnia 07/19/2020   History of stroke 06/09/2019   History of concussion 06/09/2019   History of syncope 06/09/2019   Dizziness 06/09/2019   BPH (benign prostatic hyperplasia) 12/29/2018   Colon polyps 12/29/2018   Elevated PSA 12/21/2017   Hyperlipidemia 12/21/2017   Barrett's esophagus without dysplasia 12/21/2017   Medication monitoring encounter 12/21/2017   Abnormal EKG 05/20/2017   Obstructive sleep apnea 05/20/2017   Essential hypertension 05/20/2017     Past  Surgical History:  Procedure Laterality Date   COLONOSCOPY WITH PROPOFOL  N/A 04/01/2018   Procedure: COLONOSCOPY WITH PROPOFOL ;  Surgeon: Gaylyn Gladis PENNER, MD;  [facility] ENDOSCOPY;  Service: Endoscopy;  Laterality: N/A;   COLONOSCOPY WITH PROPOFOL  N/A 07/02/2021   Procedure: COLONOSCOPY WITH PROPOFOL ;  Surgeon: Maryruth Ole DASEN, MD;  Location: [facility] ENDOSCOPY;  Service: Endoscopy;  Laterality: N/A;   ESOPHAGOGASTRODUODENOSCOPY N/A 04/01/2018   Procedure: ESOPHAGOGASTRODUODENOSCOPY (EGD);  Surgeon: Gaylyn Gladis PENNER, MD;  Location: [facility] ENDOSCOPY;  Service: Endoscopy;  Laterality: N/A;   ESOPHAGOGASTRODUODENOSCOPY  01/13/2023   ESOPHAGOGASTRODUODENOSCOPY (EGD) WITH PROPOFOL  N/A 07/02/2021   Procedure: ESOPHAGOGASTRODUODENOSCOPY (EGD) WITH PROPOFOL ;  Surgeon: Maryruth Ole DASEN, MD;  Location: [facility] ENDOSCOPY;  Service: Endoscopy;  Laterality: N/A;   ESOPHAGOGASTRODUODENOSCOPY (EGD) WITH PROPOFOL  N/A 10/07/2021   Procedure: ESOPHAGOGASTRODUODENOSCOPY (EGD) WITH PROPOFOL ;  Surgeon: Maryruth Ole DASEN, MD;  Location: [facility] ENDOSCOPY;  Service: Endoscopy;  Laterality: N/A;   HEMORROIDECTOMY  1980   HOLEP-LASER ENUCLEATION OF THE PROSTATE WITH MORCELLATION N/A 05/04/2023   Procedure: HOLEP-LASER ENUCLEATION OF THE PROSTATE WITH MORCELLATION;  Surgeon: Penne Knee, MD;  Location: [facility] ORS;  Service: Urology;  Laterality: N/A;   VASECTOMY       Current Outpatient Medications on File Prior to Visit  Medication Sig Dispense Refill   escitalopram  (LEXAPRO ) 5 MG tablet Take 1 tablet (5 mg total) by mouth daily. 90 tablet 1   hydrocortisone  cream 1 %  Apply 1 Application topically daily as needed for itching.     memantine  (NAMENDA ) 10 MG tablet TAKE 1 TABLET BY MOUTH TWICE DAILY 180 tablet 1   Misc Natural Products (PROSTATE HEALTH PO) Take 2 tablets by mouth daily.     RABEprazole  (ACIPHEX ) 20 MG tablet Take 1 tablet (20 mg total) by mouth daily. 30 min before breakfast or dinner (Patient  taking differently: Take 20 mg by mouth in the morning and at bedtime. 30 min before breakfast or dinner) 90 tablet 3   tamsulosin  (FLOMAX ) 0.4 MG CAPS capsule Take 2 capsules (0.8 mg total) by mouth daily. (Patient taking differently: Take 0.4 mg by mouth daily after supper.) 60 capsule 3   No current facility-administered medications on file prior to visit.     Allergies  Allergen Reactions   Omeprazole Diarrhea   Statins Other (See Comments)    Fatigue,muscle aches  Other Reaction(s): Other (See Comments)     Social History   Occupational History   Occupation: retired  Tobacco Use   Smoking status: Never    Passive exposure: Never   Smokeless tobacco: Never  Vaping Use   Vaping status: Never Used  Substance and Sexual Activity   Alcohol use: Yes    Comment: occassional wine   Drug use: No   Sexual activity: Yes    Partners: Female     Family History  Problem Relation Age of Onset   Dementia Mother    Stroke Mother        61s   Cancer Father        colon cancer   Stroke Father    Heart attack Father        h/o cig smoker    Prostate cancer Father        ?   Heart disease Father    Other Father        smoker   Cancer Brother        colon cancer dx'ed age 96 as of 06/2021   Esophageal cancer Brother    Heart attack Paternal Grandmother      Immunization History  Administered Date(s) Administered   Influenza Split 01/01/2015   Influenza,inj,Quad PF,6+ Mos 12/21/2017   PNEUMOCOCCAL CONJUGATE-20 10/30/2022   Pneumococcal Conjugate-13 12/21/2017   Tdap 04/14/2016   Zoster Recombinant(Shingrix) 03/03/2019, 05/11/2019     Objective: There were no vitals filed for this visit.  [name] [name] is a pleasant 73 y.o. male WD, WN in NAD. AAO x 3.  Vascular Examination: Vascular status intact b/l with palpable pedal pulses. Pedal hair present b/l. CFT immediate b/l. No edema. No pain with calf compression b/l. Skin temperature gradient WNL b/l. No ischemia or  gangrene noted b/l LE. No cyanosis or clubbing noted b/l LE.  Neurological Examination: Sensation grossly intact b/l with 10 gram monofilament. Vibratory sensation intact b/l. Involuntary tremors noted b/l feet.  Dermatological Examination: Pedal skin with normal turgor, texture and tone b/l. Toenails 1-5 b/l thick, discolored, elongated with subungual debris and pain on dorsal palpation. No hyperkeratotic lesions noted b/l.   Musculoskeletal Examination: Muscle strength 5/5 to b/l LE. No pain, crepitus or joint limitation noted with ROM bilateral LE. HAV with bunion deformity noted b/l LE.  Radiographs: None  Last A1c:      Latest Ref Rng & Units 02/11/2023    8:12 PM 10/30/2022    8:57 AM  Hemoglobin A1C  Hemoglobin-A1c 4.0 - 5.6 % 5.2  5.2    Assessment:  1. Pain due to onychomycosis of toenails of both feet   2. Hallux valgus, acquired, bilateral     Plan: Discussed treatment options for onychomycosis. Patient opted for toenail debridement only on today. Patient was evaluated and treated. All patient's and/or POA's questions/concerns addressed on today's visit. Toenails 1-5 debrided in length and girth without incident. Continue soft, supportive shoe gear daily. Report any pedal injuries to medical professional. Call office if there are any questions/concerns. -Patient/POA to call should there be question/concern in the interim.  Return if symptoms worsen or fail to improve.  Delon LITTIE Merlin, DPM      [facility] LOCATION: [address], [place] [address]                   Office [phone]   [facility] LOCATION: [address] [place], [place] [address] Office [phone]

## 2023-10-19 ENCOUNTER — Ambulatory Visit: Payer: PPO | Admitting: Neurology

## 2023-10-19 ENCOUNTER — Encounter: Payer: Self-pay | Admitting: Neurology

## 2023-10-19 VITALS — BP 118/70 | Ht 69.0 in | Wt 152.0 lb

## 2023-10-19 DIAGNOSIS — G301 Alzheimer's disease with late onset: Secondary | ICD-10-CM

## 2023-10-19 DIAGNOSIS — F02B3 Dementia in other diseases classified elsewhere, moderate, with mood disturbance: Secondary | ICD-10-CM | POA: Diagnosis not present

## 2023-10-19 MED ORDER — MEMANTINE HCL 10 MG PO TABS: 10.0000 mg | ORAL_TABLET | Freq: Two times a day (BID) | ORAL | 3 refills | Status: AC

## 2023-10-19 NOTE — Progress Notes (Signed)
 [facility]  PATIENT: [name] [name] DOB: [birth year]  REQUESTING CLINICIAN: Marylynn Verneita CROME, MD HISTORY FROM: Patient and spouse  REASON FOR VISIT: Memory decline    HISTORICAL  CHIEF COMPLAINT:  Chief Complaint  Patient presents with   Follow-up    Rm 13, with wife, increased forgetfullness   INTERVAL HISTORY 10/19/2023:  Patient presents today for follow-up, he is accompanied by wife.  Last visit was in January.  Since then wife tells me that Memory getting worse  Gets more agitated, frustrated, when he cannot remember thing  Exercising a lot, remains very active  Still playing his violin  Reading more  Some shaking in his arm, that wife described as electric shock They do not go out at night anymore. He is complaint with Namenda , could not tolerate  Exelon  due to GI side effects.     INTERVAL HISTORY 04/15/2023:  Patient presents today with spouse to report worsening of his dementia. At last visit in November, we have started him on Exelon  and currently, he is taking 3 mg twice daily. Wife reports that he has:  Difficulty with dressing  Difficultly with sequencing, some executive function deficits Difficulty with cleaning, not sure when to wash or dry dishes  Getting confused  Asked wife if she has children (see them all the time)  Trouble with simple puzzles  Leaving the water  out  Cutting the screen porch, could not understand why it was a bad idea, was fixated on cutting the screen porch  Difficulty being around large crowd, has severe agitation during christmas dinner, shouting at wife and children.   Has an episode concerning for seizures. Wife reports that patient walk in to the house, and become stiff, eyes staring, no abnormal movement but he could not respond or follow commands. She helped him to the ground, he was hypotensive to the 80s systolic, at one point was 50. She gave him orange juice, try to stand him up and it occurred again. Again  low pressure and she has to keep laying for several hours before he was able to stand again.  While laying down, he was coherent and responsive. They denies abnormal movement, no tongue biting and no urinary incontinence.    INTERVAL HISTORY 02/11/2023 Patient presents today for follow-up, he is accompanied by wife.  Last visit was in January, at that time we obtained the ATN profile for his cognitive impairment and it was positive for Alzheimer disease biomarker.  We opted not to start him on medication since he was bradycardic.  During our visit in January, he also exhibited signs concerning for Parkinson disease.  DaTscan  was obtained and was positive.  Patient started on Sinemet , since starting the Sinemet , wife tells me that his movement is better, his walking and gait is better. In terms of the memory, they report it is worsening, he has difficulty with short-term memory.  He is having issues balancing his checkbook. Today, he did have difficulty setting up the thermostat. He is also complaining of difficulty writing and spelling, wife tells me that he could not figure out how to mail a check, where to put the port staging and follow-up.  HISTORY OF PRESENT ILLNESS:  This is a 73 year old gentleman past medical history hypertension, hyperlipidemia, insomnia on trazodone  who is presenting with memory problem.  Patient reports memory problem has been going on for the past 2 years.  He does have issues with short-term memory, word finding difficulty and that really upset him.  Sometimes he is very frustrated.  He lives with his wife, he is able to perform activities of daily living.  Wife reports that he does not repeat himself and he does not need constant reminders.  He still drives, denies being lost in familiar places and denies any recent accident.  He still handles his bills.  He did does not report any trouble with family members names.   TBI: Yes, in his 30s.  Stroke:    no past history of  stroke Seizures:   no past history of seizures Sleep:  Diagnosed with sleep apnea but could not tolerate CPAP.   Mood: patient has  anxiety and depression Family history of Dementia: Mother with dementia in her 33  Functional status: independent in all ADLs and IADLs Patient lives with Spouse . Cooking: No  Cleaning: yes Shopping: Yes, with a list  Bathing: yes, no help needed Toileting: yes, no help needed Driving: No Bills: Patient   Ever left the stove on by accident?: N/A Forget how to use items around the house?: yes Getting lost going to familiar places?: yes Forgetting loved ones names?: yes  Word finding difficulty? Yes  Sleep: Good with Trazodone     OTHER MEDICAL CONDITIONS: Hypertension, hyperlipidemia, Insomnia, Parkinsonism   REVIEW OF SYSTEMS: Full 14 system review of systems performed and negative with exception of: As noted in the HPI   ALLERGIES: Allergies  Allergen Reactions   Omeprazole Diarrhea   Statins Other (See Comments)    Fatigue,muscle aches  Other Reaction(s): Other (See Comments)    HOME MEDICATIONS: Outpatient Medications Prior to Visit  Medication Sig Dispense Refill   escitalopram  (LEXAPRO ) 5 MG tablet Take 1 tablet (5 mg total) by mouth daily. 90 tablet 1   hydrocortisone  cream 1 % Apply 1 Application topically daily as needed for itching.     Misc Natural Products (PROSTATE HEALTH PO) Take 2 tablets by mouth daily.     RABEprazole  (ACIPHEX ) 20 MG tablet Take 1 tablet (20 mg total) by mouth daily. 30 min before breakfast or dinner (Patient taking differently: Take 20 mg by mouth in the morning and at bedtime. 30 min before breakfast or dinner) 90 tablet 3   memantine  (NAMENDA ) 10 MG tablet TAKE 1 TABLET BY MOUTH TWICE DAILY 180 tablet 1   tamsulosin  (FLOMAX ) 0.4 MG CAPS capsule Take 2 capsules (0.8 mg total) by mouth daily. (Patient not taking: Reported on 10/19/2023) 60 capsule 3   No facility-administered medications prior to visit.     PAST MEDICAL HISTORY: Past Medical History:  Diagnosis Date   Adenomatous polyp of colon    Barrett's esophagus without dysplasia    Benign prostatic hyperplasia with lower urinary tract symptoms    Blind    partially in right eye with scarring and some scarring in left eye but not blind    COVID-19    03/2020   Depression    remote h/o suicide ideation    Elevated PSA    Essential hypertension    GERD (gastroesophageal reflux disease)    Hyperlipidemia    Iron  deficiency anemia    Moderate late onset Alzheimer's dementia with mood disturbance (HCC)    Nodule of left lung 01/2023   Obstructive sleep apnea    not using C-Pap; unable to tolerate from initial use   Parkinsonism [facility])    Renal cyst, left    Syncope 05/20/2017    PAST SURGICAL HISTORY: Past Surgical History:  Procedure Laterality Date   COLONOSCOPY  WITH PROPOFOL  N/A 04/01/2018   Procedure: COLONOSCOPY WITH PROPOFOL ;  Surgeon: Gaylyn Gladis PENNER, MD;  Location: [facility] ENDOSCOPY;  Service: Endoscopy;  Laterality: N/A;   COLONOSCOPY WITH PROPOFOL  N/A 07/02/2021   Procedure: COLONOSCOPY WITH PROPOFOL ;  Surgeon: Maryruth Ole DASEN, MD;  Location: [facility] ENDOSCOPY;  Service: Endoscopy;  Laterality: N/A;   ESOPHAGOGASTRODUODENOSCOPY N/A 04/01/2018   Procedure: ESOPHAGOGASTRODUODENOSCOPY (EGD);  Surgeon: Gaylyn Gladis PENNER, MD;  Location: [facility] ENDOSCOPY;  Service: Endoscopy;  Laterality: N/A;   ESOPHAGOGASTRODUODENOSCOPY  01/13/2023   ESOPHAGOGASTRODUODENOSCOPY (EGD) WITH PROPOFOL  N/A 07/02/2021   Procedure: ESOPHAGOGASTRODUODENOSCOPY (EGD) WITH PROPOFOL ;  Surgeon: Maryruth Ole DASEN, MD;  Location: [facility] ENDOSCOPY;  Service: Endoscopy;  Laterality: N/A;   ESOPHAGOGASTRODUODENOSCOPY (EGD) WITH PROPOFOL  N/A 10/07/2021   Procedure: ESOPHAGOGASTRODUODENOSCOPY (EGD) WITH PROPOFOL ;  Surgeon: Maryruth Ole DASEN, MD;  Location: [facility] ENDOSCOPY;  Service: Endoscopy;  Laterality: N/A;   HEMORROIDECTOMY  1980   HOLEP-LASER  ENUCLEATION OF THE PROSTATE WITH MORCELLATION N/A 05/04/2023   Procedure: HOLEP-LASER ENUCLEATION OF THE PROSTATE WITH MORCELLATION;  Surgeon: Penne Knee, MD;  Location: [facility] ORS;  Service: Urology;  Laterality: N/A;   VASECTOMY      FAMILY HISTORY: Family History  Problem Relation Age of Onset   Dementia Mother    Stroke Mother        28s   Cancer Father        colon cancer   Stroke Father    Heart attack Father        h/o cig smoker    Prostate cancer Father        ?   Heart disease Father    Other Father        smoker   Cancer Brother        colon cancer dx'ed age 40 as of 06/2021   Esophageal cancer Brother    Heart attack Paternal Grandmother     SOCIAL HISTORY: Social History   Socioeconomic History   Marital status: Married    Spouse name: Mary   Number of children: 2   Years of education: 12   Highest education level: Associate degree: academic program  Occupational History   Occupation: retired  Tobacco Use   Smoking status: Never    Passive exposure: Never   Smokeless tobacco: Never  Vaping Use   Vaping status: Never Used  Substance and Sexual Activity   Alcohol use: Yes    Comment: occassional wine   Drug use: No   Sexual activity: Yes    Partners: Female  Other Topics Concern   Not on file  Social History Narrative   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    Retired Used to work [place] [employer] [place] [employer]  Designer, television/film set    Never smoker    Wears eye glasses   Right handed   Social Drivers of Corporate investment banker Strain: Low Risk  (06/25/2021)   Overall Financial Resource Strain (CARDIA)    Difficulty of Paying Living Expenses: Not hard at all  Food Insecurity: No Food Insecurity (06/25/2021)   Hunger Vital Sign    Worried About Running Out of Food in the Last Year: Never true    Ran Out of Food in the Last Year: Never true  Transportation Needs: No Transportation Needs (06/25/2021)   PRAPARE - Administrator, Civil Service (Medical): No     Lack of Transportation (Non-Medical): No  Physical Activity: Sufficiently Active (06/25/2021)   Exercise Vital Sign    Days of Exercise per Week:  5 days    Minutes of Exercise per Session: 60 min  Stress: No Stress Concern Present (06/25/2021)   [employer] of Occupational Health - Occupational Stress Questionnaire    Feeling of Stress : Not at all  Social Connections: Socially Integrated (06/25/2021)   Social Connection and Isolation Panel    Frequency of Communication with Friends and Family: Three times a week    Frequency of Social Gatherings with Friends and Family: Three times a week    Attends Religious Services: More than 4 times per year    Active Member of Clubs or Organizations: Yes    Attends Banker Meetings: More than 4 times per year    Marital Status: Married  Catering manager Violence: Not At Risk (06/25/2021)   Humiliation, Afraid, Rape, and Kick questionnaire    Fear of Current or Ex-Partner: No    Emotionally Abused: No    Physically Abused: No    Sexually Abused: No    PHYSICAL EXAM  GENERAL EXAM/CONSTITUTIONAL: Vitals:  Vitals:   10/19/23 1117  BP: 118/70  Weight: 152 lb (68.9 kg)  Height: 5' 9 (1.753 m)   Body mass index is 22.45 kg/m. Wt Readings from Last 3 Encounters:  10/19/23 152 lb (68.9 kg)  08/11/23 149 lb (67.6 kg)  05/08/23 149 lb 3.2 oz (67.7 kg)   Patient is in no distress; well developed, nourished and groomed; neck is supple. He does have masked facies   MUSCULOSKELETAL: Gait, strength, tone, movements noted in Neurologic exam below  NEUROLOGIC: MENTAL STATUS:     10/19/2023   11:24 AM  MMSE - Mini Mental State Exam  Not completed: Unable to complete      04/10/2022    1:24 PM  Montreal Cognitive Assessment   Visuospatial/ Executive (0/5) 2  Naming (0/3) 2  Attention: Read list of digits (0/2) 1  Attention: Read list of letters (0/1) 0  Attention: Serial 7 subtraction starting at 100 (0/3) 1  Language:  Repeat phrase (0/2) 2  Language : Fluency (0/1) 1  Abstraction (0/2) 2  Delayed Recall (0/5) 0  Orientation (0/6) 4  Total 15    CRANIAL NERVE:  2nd, 3rd, 4th, 6th- visual fields full to confrontation, extraocular muscles intact, no nystagmus 5th - facial sensation symmetric 7th - facial strength symmetric. Masked facies  8th - hearing intact 9th - palate elevates symmetrically, uvula midline 11th - shoulder shrug symmetric 12th - tongue protrusion midline  MOTOR:  normal bulk and tone, full strength in the BUE, BLE. No rigidity but there is very mild bradykinesia   SENSORY:  normal and symmetric to light touch  COORDINATION:  finger-nose-finger, fine finger movements normal. Dysdiadochokinesia present   GAIT/STATION:  Normal but he has decrease arm swing. 3 steps with the pull test.    DIAGNOSTIC DATA (LABS, IMAGING, TESTING) - I reviewed patient records, labs, notes, testing and imaging myself where available.  Lab Results  Component Value Date   WBC 5.7 02/11/2023   HGB 12.7 (L) 02/11/2023   HCT 36.9 (L) 02/11/2023   MCV 96.6 02/11/2023   PLT 242.0 02/11/2023      Component Value Date/Time   NA 139 02/11/2023 1533   NA 139 12/30/2018 0822   K 4.5 02/11/2023 1533   CL 102 02/11/2023 1533   CO2 31 02/11/2023 1533   GLUCOSE 90 02/11/2023 1533   BUN 20 02/11/2023 1533   BUN 18 12/30/2018 0822   CREATININE 0.93 02/11/2023 1533  CREATININE 0.94 08/24/2018 1017   CALCIUM  9.3 02/11/2023 1533   PROT 6.5 02/11/2023 1533   PROT 6.7 12/30/2018 0822   ALBUMIN 4.2 02/11/2023 1533   ALBUMIN 4.4 12/30/2018 0822   AST 18 02/11/2023 1533   ALT 9 02/11/2023 1533   ALKPHOS 60 02/11/2023 1533   BILITOT 0.6 02/11/2023 1533   BILITOT 0.9 12/30/2018 0822   GFRNONAA 68 12/30/2018 0822   GFRNONAA 84 08/24/2018 1017   GFRAA 78 12/30/2018 0822   GFRAA 97 08/24/2018 1017   Lab Results  Component Value Date   CHOL 194 10/30/2022   HDL 54.80 10/30/2022   LDLCALC 123 (H)  10/30/2022   LDLDIRECT 126.0 10/30/2022   TRIG 82.0 10/30/2022   CHOLHDL 4 10/30/2022   Lab Results  Component Value Date   HGBA1C 5.2 02/11/2023   Lab Results  Component Value Date   VITAMINB12 378 02/24/2023   Lab Results  Component Value Date   TSH 2.33 02/11/2023   Positive ATN profile consistent with presence of Alzheimer related pathology.  MRI Brain 03/11/2019 No acute or reversible finding. Mild age related volume loss. Minimal small vessel change of the hemispheric white matter. Tiny old cortical infarctions at the vertex bilaterally.   MRI Brain 03/05/2023 1. No evidence of an acute intracranial abnormality. 2. Small chronic cortical infarcts within the left frontal and bilateral parietal lobes, unchanged from the prior brain MRI of 03/11/2019. 3. Mild chronic small vessel ischemic changes within the cerebral white matter, similar to the prior MRI. 4. Mild-to-moderate generalized cerebral atrophy. 5. A flow void is not well delineated within the right vertebral artery V4 segment distally, suggesting high-grade stenosis or vessel occlusion. Consider CT or MR angiography for further evaluation.  DatScan  06/17/2022 Asymmetric decreased radiotracer activity in the RIGHT striatum compared to the LEFT. Findings equivocal but concerning for Parkinson's syndrome pathology.   ASSESSMENT AND PLAN  73 y.o. year old male with medical history including sleep apnea not on CPAP, hypertension hyperlipidemia, who is presenting for follow-up for his mild to moderate Alzheimer dementia and Parkinsonism.   He was not able to tolerate Exelon  due to GI side effects.  There is worsening of his memory, described as forgetfulness and sometimes irritability and anxiety in crowds.  Today he could not complete the MMSE.  I again explained to them that this is likely from progression of his dementia.  At the moment we will continue him on Namenda , advised him to continue to remain active, continue  follow-up PCP and to return in a year or sooner if worse.  Advised wife to contact me if there is worsening in behavior.    1. Moderate late onset Alzheimer's dementia with mood disturbance (HCC)      Patient Instructions  Continue current medications  Continue with Namenda   Continue to remain active  Please contact me if there is worsening of the behavior  Continue to follow up with PCP and return in a year or sooner if worse    There are well-accepted and sensible ways to reduce risk for Alzheimers disease and other degenerative brain disorders .  Exercise Daily Walk A daily 20 minute walk should be part of your routine. Disease related apathy can be a significant roadblock to exercise and the only way to overcome this is to make it a daily routine and perhaps have a reward at the end (something your loved one loves to eat or drink perhaps) or a personal trainer coming to the home can also  be very useful. Most importantly, the patient is much more likely to exercise if the caregiver / spouse does it with him/her. In general a structured, repetitive schedule is best.  General Health: Any diseases which effect your body will effect your brain such as a pneumonia, urinary infection, blood clot, heart attack or stroke. Keep contact with your primary care doctor for regular follow ups.  Sleep. A good nights sleep is healthy for the brain. Seven hours is recommended. If you have insomnia or poor sleep habits we can give you some instructions. If you have sleep apnea wear your mask.  Diet: Eating a heart healthy diet is also a good idea; fish and poultry instead of red meat, nuts (mostly non-peanuts), vegetables, fruits, olive oil or canola oil (instead of butter), minimal salt (use other spices to flavor foods), whole grain rice, bread, cereal and pasta and wine in moderation.Research is now showing that the MIND diet, which is a combination of The Mediterranean diet and the DASH diet, is  beneficial for cognitive processing and longevity. Information about this diet can be found in The MIND Diet, a book by Annitta Feeling, MS, RDN, and online at [employer].es  Finances, Power of [address] and Advance Directives: You should consider putting legal safeguards in place with regard to financial and medical decision making. While the spouse always has power of attorney for medical and financial issues in the absence of any form, you should consider what you want in case the spouse / caregiver is no longer around or capable of making decisions.   No orders of the defined types were placed in this encounter.   Meds ordered this encounter  Medications   memantine  (NAMENDA ) 10 MG tablet    Sig: Take 1 tablet (10 mg total) by mouth 2 (two) times daily.    Dispense:  180 tablet    Refill:  3    Return in about 1 year (around 10/18/2024).  I personally spent a total of 45 minutes in the care of the patient today including preparing to see the patient, getting/reviewing separately obtained history, performing a medically appropriate exam/evaluation, counseling and educating, placing orders, documenting clinical information in the EHR, and discussing diagnosis with patient and spouse and answering all other questions.    Pastor Falling, MD 10/19/2023, 5:28 PM  [facility] [address] [place], [place] [address] [phone]

## 2023-10-19 NOTE — Patient Instructions (Signed)
 Continue current medications  Continue with Namenda   Continue to remain active  Please contact me if there is worsening of the behavior  Continue to follow up with PCP and return in a year or sooner if worse    There are well-accepted and sensible ways to reduce risk for Alzheimers disease and other degenerative brain disorders .  Exercise Daily Walk A daily 20 minute walk should be part of your routine. Disease related apathy can be a significant roadblock to exercise and the only way to overcome this is to make it a daily routine and perhaps have a reward at the end (something your loved one loves to eat or drink perhaps) or a personal trainer coming to the home can also be very useful. Most importantly, the patient is much more likely to exercise if the caregiver / spouse does it with him/her. In general a structured, repetitive schedule is best.  General Health: Any diseases which effect your body will effect your brain such as a pneumonia, urinary infection, blood clot, heart attack or stroke. Keep contact with your primary care doctor for regular follow ups.  Sleep. A good nights sleep is healthy for the brain. Seven hours is recommended. If you have insomnia or poor sleep habits we can give you some instructions. If you have sleep apnea wear your mask.  Diet: Eating a heart healthy diet is also a good idea; fish and poultry instead of red meat, nuts (mostly non-peanuts), vegetables, fruits, olive oil or canola oil (instead of butter), minimal salt (use other spices to flavor foods), whole grain rice, bread, cereal and pasta and wine in moderation.Research is now showing that the MIND diet, which is a combination of The Mediterranean diet and the DASH diet, is beneficial for cognitive processing and longevity. Information about this diet can be found in The MIND Diet, a book by Annitta Feeling, MS, RDN, and online at [employer]  Finances, Power of [address] and  Advance Directives: You should consider putting legal safeguards in place with regard to financial and medical decision making. While the spouse always has power of attorney for medical and financial issues in the absence of any form, you should consider what you want in case the spouse / caregiver is no longer around or capable of making decisions.

## 2023-11-08 NOTE — Progress Notes (Signed)
 11/10/2023 8:54 PM   [name] [name] 09/03/1950 [id number]  Referring provider: Marylynn Verneita CROME, MD [address] [place],  [place] [address]  Urological history: 1. Elevated PSA  -PSA (07/2023) 3.0 -Prostate MRI (03/2021) - PI-RADS 3   2. BPH with LUTS -prostate volume (03/2021) 50.90 c -Tamsulosin  0.4 mg daily -HoLEP (05/2023) pathology negative for malignancy  3. Renal cysts -contrast CT (01/2023) - parapelvic left renal cysts   4. Bladder diverticula -contrast CT (01/2023) - right sided bladder diverticula   No chief complaint on file.  HPI: [name] [name] is a 73 y.o. male who presents today for follow up his wife, [name] [name].   Previous records reviewed.     UA ***  PVR ***    PMH: Past Medical History:  Diagnosis Date   Adenomatous polyp of colon    Barrett's esophagus without dysplasia    Benign prostatic hyperplasia with lower urinary tract symptoms    Blind    partially in right eye with scarring and some scarring in left eye but not blind    COVID-19    03/2020   Depression    remote h/o suicide ideation    Elevated PSA    Essential hypertension    GERD (gastroesophageal reflux disease)    Hyperlipidemia    Iron  deficiency anemia    Moderate late onset Alzheimer's dementia with mood disturbance (HCC)    Nodule of left lung 01/2023   Obstructive sleep apnea    not using C-Pap; unable to tolerate from initial use   Parkinsonism [facility])    Renal cyst, left    Syncope 05/20/2017    Surgical History: Past Surgical History:  Procedure Laterality Date   COLONOSCOPY WITH PROPOFOL  N/A 04/01/2018   Procedure: COLONOSCOPY WITH PROPOFOL ;  Surgeon: Gaylyn Gladis PENNER, MD;  Location: [facility] ENDOSCOPY;  Service: Endoscopy;  Laterality: N/A;   COLONOSCOPY WITH PROPOFOL  N/A 07/02/2021   Procedure: COLONOSCOPY WITH PROPOFOL ;  Surgeon: Maryruth Ole DASEN, MD;  Location: [facility] ENDOSCOPY;  Service: Endoscopy;  Laterality: N/A;    ESOPHAGOGASTRODUODENOSCOPY N/A 04/01/2018   Procedure: ESOPHAGOGASTRODUODENOSCOPY (EGD);  Surgeon: Gaylyn Gladis PENNER, MD;  Location: [facility] ENDOSCOPY;  Service: Endoscopy;  Laterality: N/A;   ESOPHAGOGASTRODUODENOSCOPY  01/13/2023   ESOPHAGOGASTRODUODENOSCOPY (EGD) WITH PROPOFOL  N/A 07/02/2021   Procedure: ESOPHAGOGASTRODUODENOSCOPY (EGD) WITH PROPOFOL ;  Surgeon: Maryruth Ole DASEN, MD;  Location: [facility] ENDOSCOPY;  Service: Endoscopy;  Laterality: N/A;   ESOPHAGOGASTRODUODENOSCOPY (EGD) WITH PROPOFOL  N/A 10/07/2021   Procedure: ESOPHAGOGASTRODUODENOSCOPY (EGD) WITH PROPOFOL ;  Surgeon: Maryruth Ole DASEN, MD;  Location: [facility] ENDOSCOPY;  Service: Endoscopy;  Laterality: N/A;   HEMORROIDECTOMY  1980   HOLEP-LASER ENUCLEATION OF THE PROSTATE WITH MORCELLATION N/A 05/04/2023   Procedure: HOLEP-LASER ENUCLEATION OF THE PROSTATE WITH MORCELLATION;  Surgeon: Penne Knee, MD;  Location: [facility] ORS;  Service: Urology;  Laterality: N/A;   VASECTOMY      Home Medications:  Allergies as of 11/10/2023       Reactions   Omeprazole Diarrhea   Statins Other (See Comments)   Fatigue,muscle aches Other Reaction(s): Other (See Comments)        Medication List        Accurate as of November 08, 2023  8:54 PM. If you have any questions, ask your nurse or doctor.          escitalopram  5 MG tablet Commonly known as: LEXAPRO  Take 1 tablet (5 mg total) by mouth daily.   hydrocortisone  cream 1 % Apply 1 Application topically  daily as needed for itching.   memantine  10 MG tablet Commonly known as: NAMENDA  Take 1 tablet (10 mg total) by mouth 2 (two) times daily.   PROSTATE HEALTH PO Take 2 tablets by mouth daily.   RABEprazole  20 MG tablet Commonly known as: ACIPHEX  Take 1 tablet (20 mg total) by mouth daily. 30 min before breakfast or dinner What changed: when to take this   tamsulosin  0.4 MG Caps capsule Commonly known as: FLOMAX  Take 2 capsules (0.8 mg total) by mouth daily.         Allergies:  Allergies  Allergen Reactions   Omeprazole Diarrhea   Statins Other (See Comments)    Fatigue,muscle aches  Other Reaction(s): Other (See Comments)    Family History: Family History  Problem Relation Age of Onset   Dementia Mother    Stroke Mother        51s   Cancer Father        colon cancer   Stroke Father    Heart attack Father        h/o cig smoker    Prostate cancer Father        ?   Heart disease Father    Other Father        smoker   Cancer Brother        colon cancer dx'ed age 82 as of 06/2021   Esophageal cancer Brother    Heart attack Paternal Grandmother     Social History:  reports that he has never smoked. He has never been exposed to tobacco smoke. He has never used smokeless tobacco. He reports current alcohol use. He reports that he does not use drugs.  ROS: Pertinent ROS in HPI  Physical Exam: There were no vitals taken for this visit.  Constitutional:  Well nourished. Alert and oriented, No acute distress. HEENT: [name] [name], moist mucus membranes.  Trachea midline, no masses. Cardiovascular: No clubbing, cyanosis, or edema. Respiratory: Normal respiratory effort, no increased work of breathing. GI: Abdomen is soft, non tender, non distended, no abdominal masses. Liver and spleen not palpable.  No hernias appreciated.  Stool sample for occult testing is not indicated.   GU: No CVA tenderness.  No bladder fullness or masses.  Patient with circumcised/uncircumcised phallus. ***Foreskin easily retracted***  Urethral meatus is patent.  No penile discharge. No penile lesions or rashes. Scrotum without lesions, cysts, rashes and/or edema.  Testicles are located scrotally bilaterally. No masses are appreciated in the testicles. Left and right epididymis are normal. Rectal: Patient with  normal sphincter tone. Anus and perineum without scarring or rashes. No rectal masses are appreciated. Prostate is approximately *** grams, *** nodules are  appreciated. Seminal vesicles are normal. Skin: No rashes, bruises or suspicious lesions. Lymph: No cervical or inguinal adenopathy. Neurologic: Grossly intact, no focal deficits, moving all 4 extremities. Psychiatric: Normal mood and affect.   Laboratory Data: See EPIC and HPI I have reviewed the labs.  See HPI.      Pertinent Imaging: ***  Assessment & Plan:    1. BPH with LUTS -PVR < 300 cc  -HoLEP on 05/05/2023   2. Urge incontinence -resolved   No follow-ups on file.  These notes generated with voice recognition software. I apologize for typographical errors.  [name] [name]  [facility] [address]  [address] [place], [place] [address] [phone]

## 2023-11-10 ENCOUNTER — Encounter: Payer: Self-pay | Admitting: Urology

## 2023-11-10 ENCOUNTER — Ambulatory Visit: Admitting: Urology

## 2023-11-10 VITALS — BP 160/83 | HR 70 | Ht 69.0 in | Wt 152.0 lb

## 2023-11-10 DIAGNOSIS — N138 Other obstructive and reflux uropathy: Secondary | ICD-10-CM

## 2023-11-10 DIAGNOSIS — N401 Enlarged prostate with lower urinary tract symptoms: Secondary | ICD-10-CM | POA: Diagnosis not present

## 2023-11-10 DIAGNOSIS — N3941 Urge incontinence: Secondary | ICD-10-CM | POA: Diagnosis not present

## 2023-11-10 LAB — BLADDER SCAN AMB NON-IMAGING

## 2023-11-10 LAB — MICROSCOPIC EXAMINATION

## 2023-11-10 LAB — URINALYSIS, COMPLETE
Bilirubin, UA: NEGATIVE
Glucose, UA: NEGATIVE
Ketones, UA: NEGATIVE
Leukocytes,UA: NEGATIVE
Nitrite, UA: NEGATIVE
Protein,UA: NEGATIVE
RBC, UA: NEGATIVE
Specific Gravity, UA: 1.015 (ref 1.005–1.030)
Urobilinogen, Ur: 0.2 mg/dL (ref 0.2–1.0)
pH, UA: 6.5 (ref 5.0–7.5)

## 2024-02-11 ENCOUNTER — Ambulatory Visit: Payer: PPO | Admitting: Neurology

## 2024-03-16 ENCOUNTER — Other Ambulatory Visit: Payer: Self-pay | Admitting: Internal Medicine

## 2024-11-09 ENCOUNTER — Ambulatory Visit: Admitting: Urology
